# Patient Record
Sex: Female | Born: 1960
Health system: Southern US, Community
[De-identification: ages and names within clinical notes are randomized; demographics above are authoritative.]

## PROBLEM LIST (undated history)

## (undated) DIAGNOSIS — F419 Anxiety disorder, unspecified: Secondary | ICD-10-CM

## (undated) DIAGNOSIS — F988 Other specified behavioral and emotional disorders with onset usually occurring in childhood and adolescence: Secondary | ICD-10-CM

## (undated) DIAGNOSIS — E079 Disorder of thyroid, unspecified: Secondary | ICD-10-CM

## (undated) DIAGNOSIS — F32A Depression, unspecified: Secondary | ICD-10-CM

## (undated) DIAGNOSIS — F329 Major depressive disorder, single episode, unspecified: Secondary | ICD-10-CM

## (undated) HISTORY — DX: Depression, unspecified: F32.A

## (undated) HISTORY — DX: Anxiety disorder, unspecified: F41.9

## (undated) HISTORY — DX: Other specified behavioral and emotional disorders with onset usually occurring in childhood and adolescence: F98.8

## (undated) HISTORY — DX: Major depressive disorder, single episode, unspecified: F32.9

---

## 2002-09-08 ENCOUNTER — Ambulatory Visit (HOSPITAL_COMMUNITY): Admission: RE | Admit: 2002-09-08 | Discharge: 2002-09-08 | Payer: Self-pay | Admitting: Endocrinology

## 2002-09-09 ENCOUNTER — Encounter: Payer: Self-pay | Admitting: Endocrinology

## 2002-09-22 ENCOUNTER — Encounter: Payer: Self-pay | Admitting: Endocrinology

## 2002-09-22 ENCOUNTER — Ambulatory Visit (HOSPITAL_COMMUNITY): Admission: RE | Admit: 2002-09-22 | Discharge: 2002-09-22 | Payer: Self-pay | Admitting: Endocrinology

## 2003-01-12 ENCOUNTER — Encounter: Admission: RE | Admit: 2003-01-12 | Discharge: 2003-01-18 | Payer: Self-pay | Admitting: Orthopedic Surgery

## 2015-10-27 ENCOUNTER — Other Ambulatory Visit: Payer: Self-pay | Admitting: *Deleted

## 2015-10-27 NOTE — Telephone Encounter (Signed)
Last filled 09/29/15. Route to pool for call in

## 2015-10-28 MED ORDER — ALPRAZOLAM 1 MG PO TABS: ORAL_TABLET | ORAL | 0 refills | Status: DC

## 2015-11-02 ENCOUNTER — Ambulatory Visit (INDEPENDENT_AMBULATORY_CARE_PROVIDER_SITE_OTHER): Payer: 59 | Admitting: Physician Assistant

## 2015-11-02 ENCOUNTER — Encounter: Payer: Self-pay | Admitting: Physician Assistant

## 2015-11-02 VITALS — BP 115/65 | HR 71 | Temp 97.0°F | Ht 65.0 in | Wt 139.0 lb

## 2015-11-02 DIAGNOSIS — K219 Gastro-esophageal reflux disease without esophagitis: Secondary | ICD-10-CM

## 2015-11-02 DIAGNOSIS — M25561 Pain in right knee: Secondary | ICD-10-CM

## 2015-11-02 DIAGNOSIS — E039 Hypothyroidism, unspecified: Secondary | ICD-10-CM

## 2015-11-02 DIAGNOSIS — F4322 Adjustment disorder with anxiety: Secondary | ICD-10-CM

## 2015-11-02 DIAGNOSIS — F900 Attention-deficit hyperactivity disorder, predominantly inattentive type: Secondary | ICD-10-CM

## 2015-11-02 DIAGNOSIS — M25562 Pain in left knee: Secondary | ICD-10-CM | POA: Diagnosis not present

## 2015-11-02 MED ORDER — AMPHETAMINE-DEXTROAMPHET ER 20 MG PO CP24: 20.0000 mg | ORAL_CAPSULE | Freq: Every day | ORAL | 0 refills | Status: DC

## 2015-11-02 MED ORDER — RISPERIDONE 0.5 MG PO TABS: 0.5000 mg | ORAL_TABLET | Freq: Every day | ORAL | 11 refills | Status: DC

## 2015-11-02 MED ORDER — MELOXICAM 15 MG PO TABS: 15.0000 mg | ORAL_TABLET | Freq: Every day | ORAL | 11 refills | Status: DC

## 2015-11-02 MED ORDER — ALPRAZOLAM 1 MG PO TABS: ORAL_TABLET | ORAL | 5 refills | Status: DC

## 2015-11-02 NOTE — Patient Instructions (Signed)
Adjustment Disorder Adjustment disorder is an unusually severe reaction to a stressful life event, such as the loss of a job or physical illness. The event may be any stressful event other than the loss of a loved one. Adjustment disorder may affect your feelings, your thinking, how you act, or a combination of these. It may interfere with personal relationships or with the way you are at work, school, or home. People with this disorder are at risk for suicide and substance abuse. They may develop a more serious mental disorder, such as major depressive disorder or post-traumatic stress disorder. SIGNS AND SYMPTOMS  Symptoms may include:  Sadness, depressed mood, or crying spells.  Loss of enjoyment.  Change in appetite or weight.  Sense of loss or hopelessness.  Thoughts of suicide.  Anxiety, worry, or nervousness.  Trouble sleeping.  Avoiding family and friends.  Poor school performance.  Fighting or vandalism.  Reckless driving.  Skipping school.  Poor work International aid/development workerperformance.  Ignoring bills. Symptoms of adjustment disorder start within 3 months of the stressful life event. They do not last more than 6 months after the event has ended. DIAGNOSIS  To make a diagnosis, your health care provider will ask about what has happened in your life and how it has affected you. He or she may also ask about your medical history and use of medicines, alcohol, and other substances. Your health care provider may do a physical exam and order lab tests or other studies. You may be referred to a mental health specialist for evaluation. TREATMENT  Treatment options include:  Counseling or talk therapy. Talk therapy is usually provided by mental health specialists.  Medicine. Certain medicines may help with depression, anxiety, and sleep.  Support groups. Support groups offer emotional support, advice, and guidance. They are made up of people who have had similar experiences. HOME CARE  INSTRUCTIONS  Keep all follow-up visits as directed by your health care provider. This is important.  Take medicines only as directed by your health care provider. SEEK MEDICAL CARE IF:  Your symptoms get worse.  SEEK IMMEDIATE MEDICAL CARE IF: You have serious thoughts about hurting yourself or someone else. MAKE SURE YOU:  Understand these instructions.  Will watch your condition.  Will get help right away if you are not doing well or get worse.   This information is not intended to replace advice given to you by your health care provider. Make sure you discuss any questions you have with your health care provider.   Document Released: 08/29/2005 Document Revised: 01/15/2014 Document Reviewed: 05/19/2013 Elsevier Interactive Patient Education [employer]2016 Elsevier Inc.

## 2015-11-02 NOTE — Progress Notes (Signed)
BP 115/65    Pulse 71    Temp 97 F (36.1 C) (Oral)    Ht 5\' 5"  (1.651 m)    Wt 139 lb (63 kg)    BMI 23.13 kg/m    Subjective:    Patient ID: Phyllis Parker, female    DOB: 1960/05/26, 55 y.o.   MRN: [id number]  Phyllis Parker is a 55 y.o. female presenting on 11/02/2015 for Discuss issues  HPI Patient here to be established as new patient at [facility].  This patient is known to me from [facility]. She had an episode last week that built up to a visit to the ED on Saturday at [facility]. She had pain in the lower left chest behind her breast. It was not worsened with movement, breathing or cough. Pain did not radiate to the neck or head or jaw.  She was ruled out cardiovascularly at the Ed.  Labs are scanned to chart.  At this time is extremely emotional and having stressors at work. There are several strong personalities and she is overwhelmed by them.  She had stopped her risperidone and wellbutrin about 4 weeks ago and this may have added to the culmination of this.    All meds are reviewed and refilled and adjusted as needed.  Past Medical History:  Diagnosis Date   ADD (attention deficit disorder)    Anxiety    Depression    Relevant past medical, surgical, family and social history reviewed and updated as indicated. Interim medical history since our last visit reviewed. Allergies and medications reviewed and updated.   Data reviewed from any sources in EPIC.  Review of Systems  Constitutional: Positive for fatigue and unexpected weight change. Negative for activity change and fever.  HENT: Negative.   Eyes: Negative.   Respiratory: Positive for chest tightness. Negative for cough, shortness of breath and wheezing.   Cardiovascular: Negative.  Negative for chest pain, palpitations and leg swelling.  Gastrointestinal: Negative.  Negative for abdominal pain.  Endocrine: Negative.   Genitourinary: Negative.  Negative for dysuria.   Musculoskeletal: Positive for arthralgias, back pain and joint swelling.  Skin: Negative.   Neurological: Negative.   Psychiatric/Behavioral: Positive for decreased concentration, dysphoric mood and sleep disturbance. Negative for suicidal ideas. The patient is nervous/anxious.      Social History   Social History   Marital status: Married    Spouse name: N/A   Number of children: N/A   Years of education: N/A   Occupational History   Not on file.   Social History Main Topics   Smoking status: Never Smoker   Smokeless tobacco: Never Used   Alcohol use Not on file   Drug use: Unknown   Sexual activity: Not on file   Other Topics Concern   Not on file   Social History Narrative   No narrative on file    History reviewed. No pertinent surgical history.  Family History  Problem Relation Age of Onset   Cancer Mother    Prostate cancer Father       Medication List       Accurate as of 11/02/15  5:16 PM. Always use your most recent med list.          ALPRAZolam 1 MG tablet Commonly known as:  XANAX Take one tab po 2 to 3 times qd prn   amphetamine-dextroamphetamine 20 MG 24 hr capsule Commonly known as:  ADDERALL XR Take 1 capsule (20  mg total) by mouth daily.   amphetamine-dextroamphetamine 20 MG 24 hr capsule Commonly known as:  ADDERALL XR Take 1 capsule (20 mg total) by mouth daily.   amphetamine-dextroamphetamine 20 MG 24 hr capsule Commonly known as:  ADDERALL XR Take 1 capsule (20 mg total) by mouth daily.   buPROPion 150 MG 24 hr tablet Commonly known as:  WELLBUTRIN XL   dicyclomine 10 MG capsule Commonly known as:  BENTYL Take 10 mg by mouth daily as needed for spasms.   hydrOXYzine 25 MG capsule Commonly known as:  VISTARIL   levothyroxine 88 MCG tablet Commonly known as:  SYNTHROID, LEVOTHROID   meloxicam 15 MG tablet Commonly known as:  MOBIC Take 1 tablet (15 mg total) by mouth daily.   pantoprazole 40 MG  tablet Commonly known as:  PROTONIX   risperiDONE 0.5 MG tablet Commonly known as:  RISPERDAL Take 1 tablet (0.5 mg total) by mouth daily.   sertraline 100 MG tablet Commonly known as:  ZOLOFT          Objective:    BP 115/65    Pulse 71    Temp 97 F (36.1 C) (Oral)    Ht 5\' 5"  (1.651 m)    Wt 139 lb (63 kg)    BMI 23.13 kg/m   Allergies  Allergen Reactions   Sulfa Antibiotics Nausea And Vomiting   Vioxx [Rofecoxib]    Wt Readings from Last 3 Encounters:  11/02/15 139 lb (63 kg)    Physical Exam  Constitutional: She is oriented to person, place, and time. She appears well-developed and well-nourished.  HENT:  Head: Normocephalic and atraumatic.  Eyes: Conjunctivae and EOM are normal. Pupils are equal, round, and reactive to light.  Neck: Normal range of motion. Neck supple.  Cardiovascular: Normal rate, regular rhythm, normal heart sounds and intact distal pulses.   Pulmonary/Chest: Effort normal and breath sounds normal.  Abdominal: Soft. Bowel sounds are normal.  Neurological: She is alert and oriented to person, place, and time. She has normal reflexes.  Skin: Skin is warm and dry. No rash noted.  Psychiatric: Judgment and thought content normal. Her mood appears anxious. She is withdrawn. She exhibits a depressed mood.        Assessment & Plan:   1. Adjustment disorder with anxious mood - risperiDONE (RISPERDAL) 0.5 MG tablet; Take 1 tablet (0.5 mg total) by mouth daily.  Dispense: 30 tablet; Refill: 11 Counselor and psychiatry numbers given Patient to find out her psychiatry benefits  2. Attention deficit hyperactivity disorder (ADHD), predominantly inattentive type - amphetamine-dextroamphetamine (ADDERALL XR) 20 MG 24 hr capsule; Take 1 capsule (20 mg total) by mouth daily.  Dispense: 30 capsule; Refill: 0 - amphetamine-dextroamphetamine (ADDERALL XR) 20 MG 24 hr capsule; Take 1 capsule (20 mg total) by mouth daily.  Dispense: 30 capsule; Refill: 0 -  amphetamine-dextroamphetamine (ADDERALL XR) 20 MG 24 hr capsule; Take 1 capsule (20 mg total) by mouth daily.  Dispense: 30 capsule; Refill: 0  3. Gastroesophageal reflux disease without esophagitis  4. Hypothyroidism, unspecified type  5. Arthralgia of both lower legs - meloxicam (MOBIC) 15 MG tablet; Take 1 tablet (15 mg total) by mouth daily.  Dispense: 30 tablet; Refill: 11   Continue all other maintenance medications as listed above. Educational handout given for adjustment disorder  Follow up plan: Return in about 4 weeks (around 11/30/2015).  Remus LofflerAngel S. Collene Massimino PA-C [facility] [address]  [place], [place] [id number] [phone]  11/02/2015, 5:16 PM

## 2015-11-29 ENCOUNTER — Ambulatory Visit (INDEPENDENT_AMBULATORY_CARE_PROVIDER_SITE_OTHER): Payer: 59 | Admitting: Physician Assistant

## 2015-11-29 ENCOUNTER — Encounter: Payer: Self-pay | Admitting: Physician Assistant

## 2015-11-29 VITALS — Temp 97.4°F | Ht 65.0 in | Wt 145.2 lb

## 2015-11-29 DIAGNOSIS — F331 Major depressive disorder, recurrent, moderate: Secondary | ICD-10-CM | POA: Diagnosis not present

## 2015-11-29 DIAGNOSIS — R42 Dizziness and giddiness: Secondary | ICD-10-CM

## 2015-11-29 DIAGNOSIS — F4322 Adjustment disorder with anxiety: Secondary | ICD-10-CM

## 2015-11-29 DIAGNOSIS — H8110 Benign paroxysmal vertigo, unspecified ear: Secondary | ICD-10-CM | POA: Insufficient documentation

## 2015-11-29 DIAGNOSIS — F32A Depression, unspecified: Secondary | ICD-10-CM | POA: Insufficient documentation

## 2015-11-29 DIAGNOSIS — F329 Major depressive disorder, single episode, unspecified: Secondary | ICD-10-CM | POA: Insufficient documentation

## 2015-11-29 MED ORDER — MECLIZINE HCL 25 MG PO TABS: 25.0000 mg | ORAL_TABLET | Freq: Three times a day (TID) | ORAL | 0 refills | Status: DC | PRN

## 2015-11-29 MED ORDER — DICYCLOMINE HCL 10 MG PO CAPS: 10.0000 mg | ORAL_CAPSULE | Freq: Three times a day (TID) | ORAL | 3 refills | Status: DC

## 2015-11-29 MED ORDER — RISPERIDONE 0.25 MG PO TABS: 0.5000 mg | ORAL_TABLET | Freq: Every day | ORAL | 6 refills | Status: DC

## 2015-11-29 NOTE — Patient Instructions (Signed)
Benign Positional Vertigo Introduction Vertigo is the feeling that you or your surroundings are moving when they are not. Benign positional vertigo is the most common form of vertigo. The cause of this condition is not serious (is benign). This condition is triggered by certain movements and positions (is positional). This condition can be dangerous if it occurs while you are doing something that could endanger you or others, such as driving. What are the causes? In many cases, the cause of this condition is not known. It may be caused by a disturbance in an area of the inner ear that helps your brain to sense movement and balance. This disturbance can be caused by a viral infection (labyrinthitis), head injury, or repetitive motion. What increases the risk? This condition is more likely to develop in:  Women.  People who are 53 years of age or older. What are the signs or symptoms? Symptoms of this condition usually happen when you move your head or your eyes in different directions. Symptoms may start suddenly, and they usually last for less than a minute. Symptoms may include:  Loss of balance and falling.  Feeling like you are spinning or moving.  Feeling like your surroundings are spinning or moving.  Nausea and vomiting.  Blurred vision.  Dizziness.  Involuntary eye movement (nystagmus). Symptoms can be mild and cause only slight annoyance, or they can be severe and interfere with daily life. Episodes of benign positional vertigo may return (recur) over time, and they may be triggered by certain movements. Symptoms may improve over time. How is this diagnosed? This condition is usually diagnosed by medical history and a physical exam of the head, neck, and ears. You may be referred to a health care provider who specializes in ear, nose, and throat (ENT) problems (otolaryngologist) or a provider who specializes in disorders of the nervous system (neurologist). You may have  additional testing, including:  MRI.  A CT scan.  Eye movement tests. Your health care provider may ask you to change positions quickly while he or she watches you for symptoms of benign positional vertigo, such as nystagmus. Eye movement may be tested with an electronystagmogram (ENG), caloric stimulation, the Dix-Hallpike test, or the roll test.  An electroencephalogram (EEG). This records electrical activity in your brain.  Hearing tests. How is this treated? Usually, your health care provider will treat this by moving your head in specific positions to adjust your inner ear back to normal. Surgery may be needed in severe cases, but this is rare. In some cases, benign positional vertigo may resolve on its own in 2-4 weeks. Follow these instructions at home: Safety  Move slowly.Avoid sudden body or head movements.  Avoid driving.  Avoid operating heavy machinery.  Avoid doing any tasks that would be dangerous to you or others if a vertigo episode would occur.  If you have trouble walking or keeping your balance, try using a cane for stability. If you feel dizzy or unstable, sit down right away.  Return to your normal activities as told by your health care provider. Ask your health care provider what activities are safe for you. General instructions  Take over-the-counter and prescription medicines only as told by your health care provider.  Avoid certain positions or movements as told by your health care provider.  Drink enough fluid to keep your urine clear or pale yellow.  Keep all follow-up visits as told by your health care provider. This is important. Contact a health care provider if:  You have a fever.  Your condition gets worse or you develop new symptoms.  Your family or friends notice any behavioral changes.  Your nausea or vomiting gets worse.  You have numbness or a pins and needles sensation. Get help right away if:  You have difficulty speaking or  moving.  You are always dizzy.  You faint.  You develop severe headaches.  You have weakness in your legs or arms.  You have changes in your hearing or vision.  You develop a stiff neck.  You develop sensitivity to light. This information is not intended to replace advice given to you by your health care provider. Make sure you discuss any questions you have with your health care provider. Document Released: 10/02/2005 Document Revised: 06/02/2015 Document Reviewed: 04/19/2014  2017 Elsevier

## 2015-11-29 NOTE — Progress Notes (Signed)
Temp 97.4 F (36.3 C) (Oral)   Ht 5\' 5"  (1.651 m)   Wt 145 lb 3.2 oz (65.9 kg)   BMI 24.16 kg/m    Subjective:    Patient ID: Phyllis Parker, female    DOB: 1960/11/07, 55 y.o.   MRN: [id number]  HPI: Phyllis Parker is a 55 y.o. female presenting on 11/29/2015 for Follow-up (1 month follow up on Depression ) and Dizziness  She states that the dizziness began about 5 days ago. She is supple couple of medications thinking that it was related to that. The symptoms did not resolve. She has had a history of motion sickness and had used Antivert in the past. She took a little bit of this and did notice some improvement. She noticed that the dizziness occurred more with head movement. She did not have any nausea vomiting associated with it. She denies any fever or chills. She denies any recent upper respiratory infections.  She also reports having dissatisfaction with having gained 6 pounds this month on the risperidone. And would like to look at different medications. He did cause significant carb craving.   Relevant past medical, surgical, family and social history reviewed and updated as indicated. Allergies and medications reviewed and updated.  Past Medical History:  Diagnosis Date  . ADD (attention deficit disorder)   . Anxiety   . Depression     History reviewed. No pertinent surgical history.  Review of Systems  Constitutional: Negative.  Negative for activity change, fatigue and fever.  HENT: Negative.   Eyes: Negative.   Respiratory: Negative.  Negative for cough.   Cardiovascular: Negative.  Negative for chest pain.  Gastrointestinal: Negative.  Negative for abdominal pain.  Endocrine: Negative.   Genitourinary: Negative.  Negative for dysuria.  Musculoskeletal: Negative.   Skin: Negative.   Neurological: Positive for dizziness. Negative for seizures, facial asymmetry, weakness, light-headedness, numbness and headaches.      Medication List       Accurate as of 11/29/15  11:51 AM. Always use your most recent med list.          ALPRAZolam 1 MG tablet Commonly known as:  XANAX Take one tab po 2 to 3 times qd prn   amphetamine-dextroamphetamine 20 MG 24 hr capsule Commonly known as:  ADDERALL XR Take 1 capsule (20 mg total) by mouth daily.   amphetamine-dextroamphetamine 20 MG 24 hr capsule Commonly known as:  ADDERALL XR Take 1 capsule (20 mg total) by mouth daily.   amphetamine-dextroamphetamine 20 MG 24 hr capsule Commonly known as:  ADDERALL XR Take 1 capsule (20 mg total) by mouth daily.   buPROPion 150 MG 24 hr tablet Commonly known as:  WELLBUTRIN XL   dicyclomine 10 MG capsule Commonly known as:  BENTYL Take 1 capsule (10 mg total) by mouth 4 (four) times daily -  before meals and at bedtime.   levothyroxine 88 MCG tablet Commonly known as:  SYNTHROID, LEVOTHROID   meclizine 25 MG tablet Commonly known as:  ANTIVERT Take 1 tablet (25 mg total) by mouth 3 (three) times daily as needed for dizziness.   meloxicam 15 MG tablet Commonly known as:  MOBIC Take 1 tablet (15 mg total) by mouth daily.   pantoprazole 40 MG tablet Commonly known as:  PROTONIX   risperiDONE 0.25 MG tablet Commonly known as:  RISPERDAL Take 2 tablets (0.5 mg total) by mouth daily.   sertraline 100 MG tablet Commonly known as:  ZOLOFT  Objective:    Temp 97.4 F (36.3 C) (Oral)   Ht 5\' 5"  (1.651 m)   Wt 145 lb 3.2 oz (65.9 kg)   BMI 24.16 kg/m   Allergies  Allergen Reactions  . Sulfa Antibiotics Nausea And Vomiting  . Vioxx [Rofecoxib]     Physical Exam  Constitutional: She is oriented to person, place, and time. She appears well-developed and well-nourished.  HENT:  Head: Normocephalic and atraumatic.  Eyes: Conjunctivae and EOM are normal. Pupils are equal, round, and reactive to light.  Cardiovascular: Normal rate, regular rhythm, normal heart sounds and intact distal pulses.   Pulmonary/Chest: Effort normal and breath sounds  normal.  Abdominal: Soft. Bowel sounds are normal.  Neurological: She is alert and oriented to person, place, and time. She has normal reflexes.  Skin: Skin is warm and dry. No rash noted.  Psychiatric: She has a normal mood and affect. Her behavior is normal. Judgment and thought content normal.  Nursing note and vitals reviewed.      Assessment & Plan:   1. Benign paroxysmal positional vertigo, unspecified laterality  2. Moderate episode of recurrent major depressive disorder (HCC)  3. Dizziness - meclizine (ANTIVERT) 25 MG tablet; Take 1 tablet (25 mg total) by mouth 3 (three) times daily as needed for dizziness.  Dispense: 30 tablet; Refill: 0  4. Adjustment disorder with anxious mood - risperiDONE (RISPERDAL) 0.25 MG tablet; Take 2 tablets (0.5 mg total) by mouth daily.  Dispense: 30 tablet; Refill: 6 Lower dose and in one week change if appetite is not better.  Continue all other maintenance medications as listed above.  Follow up plan: Return in about 4 weeks (around 12/27/2015) for recheck meds.  Educational handout given for vertigo  Remus Loffler PA-C [facility] [address]  [place], [place] [address] [phone]   11/29/2015, 11:51 AM

## 2016-02-01 ENCOUNTER — Other Ambulatory Visit: Payer: Self-pay | Admitting: Physician Assistant

## 2016-02-01 DIAGNOSIS — R42 Dizziness and giddiness: Secondary | ICD-10-CM

## 2016-02-07 ENCOUNTER — Other Ambulatory Visit: Payer: Self-pay | Admitting: Physician Assistant

## 2016-02-10 ENCOUNTER — Other Ambulatory Visit: Payer: Self-pay | Admitting: *Deleted

## 2016-02-28 ENCOUNTER — Other Ambulatory Visit: Payer: Self-pay | Admitting: Physician Assistant

## 2016-03-29 ENCOUNTER — Other Ambulatory Visit: Payer: Self-pay | Admitting: Physician Assistant

## 2016-03-29 DIAGNOSIS — R42 Dizziness and giddiness: Secondary | ICD-10-CM

## 2016-04-24 ENCOUNTER — Ambulatory Visit (INDEPENDENT_AMBULATORY_CARE_PROVIDER_SITE_OTHER): Payer: 59 | Admitting: Physician Assistant

## 2016-04-24 ENCOUNTER — Encounter: Payer: Self-pay | Admitting: Physician Assistant

## 2016-04-24 VITALS — BP 108/73 | HR 72 | Temp 97.0°F | Ht 65.0 in | Wt 150.2 lb

## 2016-04-24 DIAGNOSIS — F900 Attention-deficit hyperactivity disorder, predominantly inattentive type: Secondary | ICD-10-CM | POA: Diagnosis not present

## 2016-04-24 DIAGNOSIS — E039 Hypothyroidism, unspecified: Secondary | ICD-10-CM | POA: Diagnosis not present

## 2016-04-24 DIAGNOSIS — F331 Major depressive disorder, recurrent, moderate: Secondary | ICD-10-CM | POA: Diagnosis not present

## 2016-04-24 DIAGNOSIS — Z0000 Encounter for general adult medical examination without abnormal findings: Secondary | ICD-10-CM | POA: Diagnosis not present

## 2016-04-24 DIAGNOSIS — B001 Herpesviral vesicular dermatitis: Secondary | ICD-10-CM

## 2016-04-24 MED ORDER — TOPIRAMATE 50 MG PO TABS: 50.0000 mg | ORAL_TABLET | Freq: Two times a day (BID) | ORAL | 5 refills | Status: DC

## 2016-04-24 MED ORDER — VALACYCLOVIR HCL 500 MG PO TABS: 500.0000 mg | ORAL_TABLET | Freq: Two times a day (BID) | ORAL | 11 refills | Status: DC

## 2016-04-24 MED ORDER — AMPHETAMINE-DEXTROAMPHET ER 20 MG PO CP24: 20.0000 mg | ORAL_CAPSULE | Freq: Every day | ORAL | 0 refills | Status: DC

## 2016-04-24 NOTE — Patient Instructions (Signed)
Stress and Stress Management Stress is a normal reaction to life events. It is what you feel when life demands more than you are used to or more than you can handle. Some stress can be useful. For example, the stress reaction can help you catch the last bus of the day, study for a test, or meet a deadline at work. But stress that occurs too often or for too long can cause problems. It can affect your emotional health and interfere with relationships and normal daily activities. Too much stress can weaken your immune system and increase your risk for physical illness. If you already have a medical problem, stress can make it worse. What are the causes? All sorts of life events may cause stress. An event that causes stress for one person may not be stressful for another person. Major life events commonly cause stress. These may be positive or negative. Examples include losing your job, moving into a new home, getting married, having a baby, or losing a loved one. Less obvious life events may also cause stress, especially if they occur day after day or in combination. Examples include working long hours, driving in traffic, caring for children, being in debt, or being in a difficult relationship. What are the signs or symptoms? Stress may cause emotional symptoms including, the following:  Anxiety. This is feeling worried, afraid, on edge, overwhelmed, or out of control.  Anger. This is feeling irritated or impatient.  Depression. This is feeling sad, down, helpless, or guilty.  Difficulty focusing, remembering, or making decisions.  Stress may cause physical symptoms, including the following:  Aches and pains. These may affect your head, neck, back, stomach, or other areas of your body.  Tight muscles or clenched jaw.  Low energy or trouble sleeping.  Stress may cause unhealthy behaviors, including the following:  Eating to feel better (overeating) or skipping meals.  Sleeping too little,  too much, or both.  Working too much or putting off tasks (procrastination).  Smoking, drinking alcohol, or using drugs to feel better.  How is this diagnosed? Stress is diagnosed through an assessment by your health care provider. Your health care provider will ask questions about your symptoms and any stressful life events.Your health care provider will also ask about your medical history and may order blood tests or other tests. Certain medical conditions and medicine can cause physical symptoms similar to stress. Mental illness can cause emotional symptoms and unhealthy behaviors similar to stress. Your health care provider may refer you to a mental health professional for further evaluation. How is this treated? Stress management is the recommended treatment for stress.The goals of stress management are reducing stressful life events and coping with stress in healthy ways. Techniques for reducing stressful life events include the following:  Stress identification. Self-monitor for stress and identify what causes stress for you. These skills may help you to avoid some stressful events.  Time management. Set your priorities, keep a calendar of events, and learn to say "no." These tools can help you avoid making too many commitments.  Techniques for coping with stress include the following:  Rethinking the problem. Try to think realistically about stressful events rather than ignoring them or overreacting. Try to find the positives in a stressful situation rather than focusing on the negatives.  Exercise. Physical exercise can release both physical and emotional tension. The key is to find a form of exercise you enjoy and do it regularly.  Relaxation techniques. These relax the body and   meditation, tai chi, biofeedback, deep breathing, progressive muscle relaxation, listening to music, being out in nature, journaling, and other hobbies. Again, the key is to find one or  more that you enjoy and can do regularly.  Healthy lifestyle. Eat a balanced diet, get plenty of sleep, and do not smoke. Avoid using alcohol or drugs to relax.  Strong support network. Spend time with family, friends, or other people you enjoy being around.Express your feelings and talk things over with someone you trust. Counseling or talktherapy with a mental health professional may be helpful if you are having difficulty managing stress on your own. Medicine is typically not recommended for the treatment of stress.Talk to your health care provider if you think you need medicine for symptoms of stress. Follow these instructions at home:  Keep all follow-up visits as directed by your health care provider.  Take all medicines as directed by your health care provider. Contact a health care provider if:  Your symptoms get worse or you start having new symptoms.  You feel overwhelmed by your problems and can no longer manage them on your own. Get help right away if:  You feel like hurting yourself or someone else. This information is not intended to replace advice given to you by your health care provider. Make sure you discuss any questions you have with your health care provider. Document Released: 06/20/2000 Document Revised: 06/02/2015 Document Reviewed: 08/19/2012 Elsevier Interactive Patient Education  2017 [employer].

## 2016-04-24 NOTE — Progress Notes (Signed)
BP 108/73   Pulse 72   Temp 97 F (36.1 C) (Oral)   Ht 5\' 5"  (1.651 m)   Wt 150 lb 3.2 oz (68.1 kg)   BMI 24.99 kg/m    Subjective:    Patient ID: Phyllis Parker, female    DOB: June 12, 1960, 56 y.o.   MRN: [id number]  HPI: Phyllis Parker is a 56 y.o. female presenting on 04/24/2016 for Follow-up; Thyroid check; and Medication Refill  This patient comes in for periodic recheck on medications and conditions including hypothyroidism, depression, ADHD, mourning. She is havinga very hard time with the death of her stepchildren's mother (husband's ex wife).  The family was close despite the divorce, she is grieving for the children. The woman committed suicide. Severe fatigue, no energy or motivation.   Depression screen [facility] 2/9 04/24/2016 11/29/2015 11/02/2015  Decreased Interest 1 1 1   Down, Depressed, Hopeless 1 - 1  PHQ - 2 Score 2 1 2   Altered sleeping 1 1 1   Tired, decreased energy 1 1 1   Change in appetite 1 1 1   Feeling bad or failure about yourself  1 1 1   Trouble concentrating 1 1 1   Moving slowly or fidgety/restless 1 1 1   Suicidal thoughts 0 0 0  PHQ-9 Score 8 7 8      All medications are reviewed today. There are no reports of any problems with the medications. All of the medical conditions are reviewed and updated.  Lab work is reviewed and will be ordered as medically necessary. There are no new problems reported with today's visit.   Relevant past medical, surgical, family and social history reviewed and updated as indicated. Allergies and medications reviewed and updated.  Past Medical History:  Diagnosis Date  . ADD (attention deficit disorder)   . Anxiety   . Depression     History reviewed. No pertinent surgical history.  Review of Systems  Constitutional: Negative.  Negative for activity change, fatigue and fever.  HENT: Negative.   Eyes: Negative.   Respiratory: Negative.  Negative for cough.   Cardiovascular: Negative.  Negative for chest pain.   Gastrointestinal: Negative.  Negative for abdominal pain.  Endocrine: Negative.   Genitourinary: Negative.  Negative for dysuria.  Musculoskeletal: Negative.   Skin: Negative.   Neurological: Negative.   Psychiatric/Behavioral: Positive for decreased concentration, dysphoric mood and sleep disturbance. Negative for self-injury and suicidal ideas. The patient is nervous/anxious.     Allergies as of 04/24/2016      Reactions   Sulfa Antibiotics Nausea And Vomiting   Vioxx [rofecoxib]       Medication List       Accurate as of 04/24/16  2:54 PM. Always use your most recent med list.          ALPRAZolam 1 MG tablet Commonly known as:  XANAX Take one tab po 2 to 3 times qd prn   amphetamine-dextroamphetamine 20 MG 24 hr capsule Commonly known as:  ADDERALL XR Take 1 capsule (20 mg total) by mouth daily.   amphetamine-dextroamphetamine 20 MG 24 hr capsule Commonly known as:  ADDERALL XR Take 1 capsule (20 mg total) by mouth daily.   amphetamine-dextroamphetamine 20 MG 24 hr capsule Commonly known as:  ADDERALL XR Take 1 capsule (20 mg total) by mouth daily.   dicyclomine 10 MG capsule Commonly known as:  BENTYL Take 1 capsule (10 mg total) by mouth 4 (four) times daily -  before meals and at bedtime.   levothyroxine 88 MCG  tablet Commonly known as:  SYNTHROID, LEVOTHROID TAKE 1 TABLET DAILY   meclizine 25 MG tablet Commonly known as:  ANTIVERT Take 1 tablet by mouth 3 (three) times daily as needed for dizziness.   meloxicam 15 MG tablet Commonly known as:  MOBIC Take 1 tablet (15 mg total) by mouth daily.   pantoprazole 40 MG tablet Commonly known as:  PROTONIX   sertraline 100 MG tablet Commonly known as:  ZOLOFT TAKE 1 TABLET DAILY   topiramate 50 MG tablet Commonly known as:  TOPAMAX Take 1 tablet (50 mg total) by mouth 2 (two) times daily.   valACYclovir 500 MG tablet Commonly known as:  VALTREX Take 1 tablet (500 mg total) by mouth 2 (two) times  daily.          Objective:    BP 108/73   Pulse 72   Temp 97 F (36.1 C) (Oral)   Ht 5\' 5"  (1.651 m)   Wt 150 lb 3.2 oz (68.1 kg)   BMI 24.99 kg/m   Allergies  Allergen Reactions  . Sulfa Antibiotics Nausea And Vomiting  . Vioxx [Rofecoxib]     Physical Exam  Constitutional: She is oriented to person, place, and time. She appears well-developed and well-nourished.  HENT:  Head: Normocephalic and atraumatic.  Eyes: Conjunctivae and EOM are normal. Pupils are equal, round, and reactive to light.  Cardiovascular: Normal rate, regular rhythm, normal heart sounds and intact distal pulses.   Pulmonary/Chest: Effort normal and breath sounds normal.  Abdominal: Soft. Bowel sounds are normal.  Neurological: She is alert and oriented to person, place, and time. She has normal reflexes.  Skin: Skin is warm and dry. No rash noted.  Psychiatric: She has a normal mood and affect. Her behavior is normal. Judgment and thought content normal.    No results found for this or any previous visit.    Assessment & Plan:   1. Hypothyroidism, unspecified type - CMP14+EGFR - TSH  2. Moderate episode of recurrent major depressive disorder (HCC) - topiramate (TOPAMAX) 50 MG tablet; Take 1 tablet (50 mg total) by mouth 2 (two) times daily.  Dispense: 60 tablet; Refill: 5  3. Attention deficit hyperactivity disorder (ADHD), predominantly inattentive type - amphetamine-dextroamphetamine (ADDERALL XR) 20 MG 24 hr capsule; Take 1 capsule (20 mg total) by mouth daily.  Dispense: 30 capsule; Refill: 0  4. Well adult exam Not performed, here for labs ordering - CMP14+EGFR - Lipid panel - TSH - CBC with Differential/Platelet  5. Fever blister - valACYclovir (VALTREX) 500 MG tablet; Take 1 tablet (500 mg total) by mouth 2 (two) times daily.  Dispense: 30 tablet; Refill: 11   Current Outpatient Prescriptions:  .  ALPRAZolam (XANAX) 1 MG tablet, Take one tab po 2 to 3 times qd prn, Disp: 90  tablet, Rfl: 5 .  dicyclomine (BENTYL) 10 MG capsule, Take 1 capsule (10 mg total) by mouth 4 (four) times daily -  before meals and at bedtime., Disp: 90 capsule, Rfl: 3 .  levothyroxine (SYNTHROID, LEVOTHROID) 88 MCG tablet, TAKE 1 TABLET DAILY, Disp: 30 tablet, Rfl: 6 .  meclizine (ANTIVERT) 25 MG tablet, Take 1 tablet by mouth 3 (three) times daily as needed for dizziness., Disp: 30 tablet, Rfl: 0 .  meloxicam (MOBIC) 15 MG tablet, Take 1 tablet (15 mg total) by mouth daily., Disp: 30 tablet, Rfl: 11 .  sertraline (ZOLOFT) 100 MG tablet, TAKE 1 TABLET DAILY, Disp: 30 tablet, Rfl: 0 .  amphetamine-dextroamphetamine (ADDERALL XR) 20  MG 24 hr capsule, Take 1 capsule (20 mg total) by mouth daily. (Patient not taking: Reported on 04/24/2016), Disp: 30 capsule, Rfl: 0 .  amphetamine-dextroamphetamine (ADDERALL XR) 20 MG 24 hr capsule, Take 1 capsule (20 mg total) by mouth daily. (Patient not taking: Reported on 04/24/2016), Disp: 30 capsule, Rfl: 0 .  amphetamine-dextroamphetamine (ADDERALL XR) 20 MG 24 hr capsule, Take 1 capsule (20 mg total) by mouth daily., Disp: 30 capsule, Rfl: 0 .  pantoprazole (PROTONIX) 40 MG tablet, , Disp: , Rfl:  .  topiramate (TOPAMAX) 50 MG tablet, Take 1 tablet (50 mg total) by mouth 2 (two) times daily., Disp: 60 tablet, Rfl: 5 .  valACYclovir (VALTREX) 500 MG tablet, Take 1 tablet (500 mg total) by mouth 2 (two) times daily., Disp: 30 tablet, Rfl: 11  Continue all other maintenance medications as listed above.  Follow up plan: Return in about 4 weeks (around 05/22/2016).  Educational handout given for stress management  Remus Loffler PA-C [facility] [address]  [place], [place] [address] [phone]   04/24/2016, 2:54 PM

## 2016-04-25 LAB — LIPID PANEL
CHOL/HDL RATIO: 2.9 ratio (ref 0.0–4.4)
Cholesterol, Total: 233 mg/dL — ABNORMAL HIGH (ref 100–199)
HDL: 81 mg/dL (ref >39)
LDL CALC: 124 mg/dL — AB (ref 0–99)
TRIGLYCERIDES: 138 mg/dL (ref 0–149)
VLDL CHOLESTEROL CAL: 28 mg/dL (ref 5–40)

## 2016-04-25 LAB — CBC WITH DIFFERENTIAL/PLATELET
BASOS: 0 %
Basophils Absolute: 0 10*3/uL (ref 0.0–0.2)
EOS (ABSOLUTE): 0.2 10*3/uL (ref 0.0–0.4)
Eos: 3 %
HEMATOCRIT: 36.6 % (ref 34.0–46.6)
Hemoglobin: 12.3 g/dL (ref 11.1–15.9)
IMMATURE GRANULOCYTES: 0 %
Immature Grans (Abs): 0 10*3/uL (ref 0.0–0.1)
Lymphocytes Absolute: 1.4 10*3/uL (ref 0.7–3.1)
Lymphs: 28 %
MCH: 30.4 pg (ref 26.6–33.0)
MCHC: 33.6 g/dL (ref 31.5–35.7)
MCV: 91 fL (ref 79–97)
Monocytes Absolute: 0.2 10*3/uL (ref 0.1–0.9)
Monocytes: 3 %
NEUTROS ABS: 3.3 10*3/uL (ref 1.4–7.0)
NEUTROS PCT: 66 %
PLATELETS: 234 10*3/uL (ref 150–379)
RBC: 4.04 x10E6/uL (ref 3.77–5.28)
RDW: 12.5 % (ref 12.3–15.4)
WBC: 5.1 10*3/uL (ref 3.4–10.8)

## 2016-04-25 LAB — CMP14+EGFR
A/G RATIO: 2.4 — AB (ref 1.2–2.2)
ALBUMIN: 4.8 g/dL (ref 3.5–5.5)
ALT: 13 IU/L (ref 0–32)
AST: 20 IU/L (ref 0–40)
Alkaline Phosphatase: 66 IU/L (ref 39–117)
BUN/Creatinine Ratio: 14 (ref 9–23)
BUN: 11 mg/dL (ref 6–24)
Bilirubin Total: 0.3 mg/dL (ref 0.0–1.2)
CALCIUM: 9.6 mg/dL (ref 8.7–10.2)
CO2: 24 mmol/L (ref 18–29)
CREATININE: 0.79 mg/dL (ref 0.57–1.00)
Chloride: 101 mmol/L (ref 96–106)
GFR, EST AFRICAN AMERICAN: 99 mL/min/{1.73_m2} (ref >59)
GFR, EST NON AFRICAN AMERICAN: 86 mL/min/{1.73_m2} (ref >59)
GLOBULIN, TOTAL: 2 g/dL (ref 1.5–4.5)
Glucose: 81 mg/dL (ref 65–99)
Potassium: 4.1 mmol/L (ref 3.5–5.2)
SODIUM: 142 mmol/L (ref 134–144)
TOTAL PROTEIN: 6.8 g/dL (ref 6.0–8.5)

## 2016-04-25 LAB — TSH: TSH: 4 u[IU]/mL (ref 0.450–4.500)

## 2016-04-26 ENCOUNTER — Other Ambulatory Visit: Payer: Self-pay | Admitting: Physician Assistant

## 2016-04-27 NOTE — Telephone Encounter (Signed)
Last filled 03/30/16, last seen 04/24/16. Route to pool for call in

## 2016-04-30 NOTE — Telephone Encounter (Signed)
rx called into pharmacy

## 2016-05-10 ENCOUNTER — Other Ambulatory Visit: Payer: Self-pay | Admitting: Physician Assistant

## 2016-06-11 ENCOUNTER — Other Ambulatory Visit: Payer: Self-pay | Admitting: Physician Assistant

## 2016-07-23 ENCOUNTER — Other Ambulatory Visit: Payer: Self-pay | Admitting: Physician Assistant

## 2016-08-07 ENCOUNTER — Ambulatory Visit: Payer: 59 | Admitting: Physician Assistant

## 2016-09-11 ENCOUNTER — Other Ambulatory Visit: Payer: Self-pay | Admitting: Physician Assistant

## 2016-09-11 DIAGNOSIS — R42 Dizziness and giddiness: Secondary | ICD-10-CM

## 2016-09-25 ENCOUNTER — Other Ambulatory Visit: Payer: Self-pay | Admitting: Physician Assistant

## 2016-09-25 NOTE — Telephone Encounter (Signed)
Last seen 04/26/16  [facility]

## 2016-10-02 ENCOUNTER — Other Ambulatory Visit: Payer: Self-pay | Admitting: Physician Assistant

## 2016-10-18 ENCOUNTER — Other Ambulatory Visit: Payer: Self-pay | Admitting: Physician Assistant

## 2016-10-18 DIAGNOSIS — R42 Dizziness and giddiness: Secondary | ICD-10-CM

## 2016-10-23 ENCOUNTER — Other Ambulatory Visit: Payer: Self-pay | Admitting: Physician Assistant

## 2016-10-24 NOTE — Telephone Encounter (Signed)
Rx phoned in.

## 2016-11-06 ENCOUNTER — Encounter: Payer: Self-pay | Admitting: Family

## 2016-11-06 ENCOUNTER — Ambulatory Visit (INDEPENDENT_AMBULATORY_CARE_PROVIDER_SITE_OTHER): Payer: 59

## 2016-11-06 ENCOUNTER — Ambulatory Visit (INDEPENDENT_AMBULATORY_CARE_PROVIDER_SITE_OTHER): Payer: 59 | Admitting: Family

## 2016-11-06 VITALS — BP 128/85 | HR 76 | Temp 96.7°F

## 2016-11-06 DIAGNOSIS — S8265XA Nondisplaced fracture of lateral malleolus of left fibula, initial encounter for closed fracture: Secondary | ICD-10-CM

## 2016-11-06 DIAGNOSIS — M25572 Pain in left ankle and joints of left foot: Secondary | ICD-10-CM | POA: Diagnosis not present

## 2016-11-06 NOTE — Progress Notes (Signed)
° °  Subjective:    Patient ID: Phyllis Parker, female    DOB: 12-Dec-1960, 56 y.o.   MRN: [id number]  Fall  The accident occurred 3 to 6 hours ago. The fall occurred while walking (stairs). She landed on grass. There was no blood loss. Point of impact: left ankle. Pain location: left ankle. The pain is at a severity of 6/10. The pain is moderate. The symptoms are aggravated by pressure on injury, movement and standing. Pertinent negatives include no bowel incontinence, fever, hearing loss, hematuria, loss of consciousness, nausea, numbness, tingling, visual change or vomiting. She has tried acetaminophen and rest for the symptoms. The treatment provided mild relief.      Review of Systems  Constitutional: Negative for fever.  Gastrointestinal: Negative for bowel incontinence, nausea and vomiting.  Genitourinary: Negative for hematuria.  Musculoskeletal: Positive for arthralgias.  Neurological: Negative for tingling, loss of consciousness and numbness.  All other systems reviewed and are negative.      Objective:   Physical Exam  Constitutional: She is oriented to person, place, and time. She appears well-developed and well-nourished. No distress.  HENT:  Head: Normocephalic.  Cardiovascular: Normal rate, regular rhythm, normal heart sounds and intact distal pulses.   No murmur heard. Pulmonary/Chest: Effort normal and breath sounds normal. No respiratory distress. She has no wheezes.  Musculoskeletal: She exhibits edema and tenderness.  Left lateral and medial ankle tenderness, moderate swelling on lateral left ankle, decrease ROM with flexion or extension  Neurological: She is alert and oriented to person, place, and time.  Skin: Skin is warm and dry.  Psychiatric: She has a normal mood and affect. Her behavior is normal. Judgment and thought content normal.  Vitals reviewed.  Ankle x-ray- Nondisplaced fracture Preliminary reading by Jannifer Rodneyhristy Lonell Stamos, FNP [facility]   BP 128/85    Pulse 76     Temp (!) 96.7 F (35.9 C) (Oral)       Assessment & Plan:  1. Acute left ankle pain  - DG Ankle Complete Left; Future - Ambulatory referral to Orthopedic Surgery  2. Closed nondisplaced fracture of lateral malleolus of left fibula, initial encounter - Ambulatory referral to Orthopedic Surgery  Stat referral to Ortho  Will get appt today No weight bearing until Ortho   Jannifer Rodneyhristy Julez Huseby, FNP

## 2016-11-06 NOTE — Patient Instructions (Signed)
Ankle Pain Many things can cause ankle pain, including an injury to the area and overuse of the ankle.The ankle joint holds your body weight and allows you to move around. Ankle pain can occur on either side or the back of one ankle or both ankles. Ankle pain may be sharp and burning or dull and aching. There may be tenderness, stiffness, redness, or warmth around the ankle. Follow these instructions at home: Activity  Rest your ankle as told by your health care provider. Avoid any activities that cause ankle pain.  Do exercises as told by your health care provider.  Ask your health care provider if you can drive. Using a brace, a bandage, or crutches  If you were given a brace: ? Wear it as told by your health care provider. ? Remove it when you take a bath or a shower. ? Try not to move your ankle very much, but wiggle your toes from time to time. This helps to prevent swelling.  If you were given an elastic bandage: ? Remove it when you take a bath or a shower. ? Try not to move your ankle very much, but wiggle your toes from time to time. This helps to prevent swelling. ? Adjust the bandage to make it more comfortable if it feels too tight. ? Loosen the bandage if you have numbness or tingling in your foot or if your foot turns cold and blue.  If you have crutches, use them as told by your health care provider. Continue to use them until you can walk without feeling pain in your ankle. Managing pain, stiffness, and swelling  Raise (elevate) your ankle above the level of your heart while you are sitting or lying down.  If directed, apply ice to the area: ? Put ice in a plastic bag. ? Place a towel between your skin and the bag. ? Leave the ice on for 20 minutes, 2-3 times per day. General instructions  Keep all follow-up visits as told by your health care provider. This is important.  Record this information that may be helpful for you and your health care provider: ? How  often you have ankle pain. ? Where the pain is located. ? What the pain feels like.  Take over-the-counter and prescription medicines only as told by your health care provider. Contact a health care provider if:  Your pain gets worse.  Your pain is not relieved with medicines.  You have a fever or chills.  You are having more trouble with walking.  You have new symptoms. Get help right away if:  Your foot, leg, toes, or ankle tingles or becomes numb.  Your foot, leg, toes, or ankle becomes swollen.  Your foot, leg, toes, or ankle turns pale or blue. This information is not intended to replace advice given to you by your health care provider. Make sure you discuss any questions you have with your health care provider. Document Released: 06/14/2009 Document Revised: 08/26/2015 Document Reviewed: 07/27/2014 Elsevier Interactive Patient Education  2017 [employer]

## 2016-11-14 ENCOUNTER — Ambulatory Visit: Payer: 59 | Admitting: Physician Assistant

## 2016-11-20 DIAGNOSIS — S8265XD Nondisplaced fracture of lateral malleolus of left fibula, subsequent encounter for closed fracture with routine healing: Secondary | ICD-10-CM | POA: Diagnosis not present

## 2016-11-21 ENCOUNTER — Other Ambulatory Visit: Payer: Self-pay | Admitting: Physician Assistant

## 2016-11-21 NOTE — Telephone Encounter (Signed)
rx called into pharmacy

## 2016-12-03 ENCOUNTER — Other Ambulatory Visit: Payer: Self-pay | Admitting: Physician Assistant

## 2016-12-11 DIAGNOSIS — S8265XD Nondisplaced fracture of lateral malleolus of left fibula, subsequent encounter for closed fracture with routine healing: Secondary | ICD-10-CM | POA: Diagnosis not present

## 2016-12-24 ENCOUNTER — Other Ambulatory Visit: Payer: Self-pay | Admitting: Physician Assistant

## 2016-12-24 DIAGNOSIS — R42 Dizziness and giddiness: Secondary | ICD-10-CM

## 2016-12-25 NOTE — Telephone Encounter (Signed)
rx phoned in.

## 2016-12-25 NOTE — Telephone Encounter (Signed)
Last seen 11/06/16  Phyllis Parker If approved route [facility] nurse [facility]

## 2017-01-02 ENCOUNTER — Other Ambulatory Visit: Payer: Self-pay | Admitting: Physician Assistant

## 2017-01-17 DIAGNOSIS — M25572 Pain in left ankle and joints of left foot: Secondary | ICD-10-CM | POA: Diagnosis not present

## 2017-01-17 DIAGNOSIS — S8265XD Nondisplaced fracture of lateral malleolus of left fibula, subsequent encounter for closed fracture with routine healing: Secondary | ICD-10-CM | POA: Diagnosis not present

## 2017-01-22 ENCOUNTER — Other Ambulatory Visit: Payer: Self-pay | Admitting: Physician Assistant

## 2017-01-25 DIAGNOSIS — M25572 Pain in left ankle and joints of left foot: Secondary | ICD-10-CM | POA: Diagnosis not present

## 2017-01-29 ENCOUNTER — Ambulatory Visit: Payer: 59 | Admitting: Physician Assistant

## 2017-01-29 ENCOUNTER — Encounter: Payer: Self-pay | Admitting: Physician Assistant

## 2017-01-29 VITALS — BP 114/81 | HR 76 | Temp 98.0°F | Ht 65.0 in | Wt 160.4 lb

## 2017-01-29 DIAGNOSIS — F900 Attention-deficit hyperactivity disorder, predominantly inattentive type: Secondary | ICD-10-CM

## 2017-01-29 DIAGNOSIS — M25561 Pain in right knee: Secondary | ICD-10-CM | POA: Diagnosis not present

## 2017-01-29 DIAGNOSIS — G43809 Other migraine, not intractable, without status migrainosus: Secondary | ICD-10-CM

## 2017-01-29 DIAGNOSIS — F331 Major depressive disorder, recurrent, moderate: Secondary | ICD-10-CM

## 2017-01-29 DIAGNOSIS — M25562 Pain in left knee: Secondary | ICD-10-CM

## 2017-01-29 MED ORDER — AMPHETAMINE-DEXTROAMPHET ER 20 MG PO CP24: 20.0000 mg | ORAL_CAPSULE | Freq: Every day | ORAL | 0 refills | Status: DC

## 2017-01-29 MED ORDER — PANTOPRAZOLE SODIUM 40 MG PO TBEC: 40.0000 mg | DELAYED_RELEASE_TABLET | Freq: Every day | ORAL | 11 refills | Status: DC

## 2017-01-29 MED ORDER — MELOXICAM 15 MG PO TABS: 15.0000 mg | ORAL_TABLET | Freq: Every day | ORAL | 11 refills | Status: DC

## 2017-01-29 MED ORDER — ALPRAZOLAM 1 MG PO TABS: ORAL_TABLET | ORAL | 5 refills | Status: DC

## 2017-01-29 MED ORDER — DULOXETINE HCL 30 MG PO CPEP: 30.0000 mg | ORAL_CAPSULE | Freq: Every day | ORAL | 3 refills | Status: DC

## 2017-01-29 MED ORDER — SUMATRIPTAN SUCCINATE 50 MG PO TABS: 50.0000 mg | ORAL_TABLET | ORAL | 5 refills | Status: DC | PRN

## 2017-01-29 NOTE — Patient Instructions (Signed)
In a few days you may receive a survey in the mail or online from [employer] regarding your visit with us today. Please take a moment to fill this out. Your feedback is very important to our whole office. It can help us better understand your needs as well as improve your experience and satisfaction. Thank you for taking your time to complete it. We care about you.  Prudy FeelerAngel Jahad Old, PA-C

## 2017-01-30 NOTE — Progress Notes (Signed)
BP 114/81    Pulse 76    Temp 98 F (36.7 C) (Oral)    Ht 5\' 5"  (1.651 m)    Wt 160 lb 6.4 oz (72.8 kg)    BMI 26.69 kg/m    Subjective:    Patient ID: Phyllis Parker, female    DOB: 07/10/1960, 57 y.o.   MRN: [id number]  HPI: Phyllis Parker is a 57 y.o. female presenting on 01/29/2017 for Depression; Medication Refill; and Follow-up (thyroid )  Patient comes in for recheck on her chronic medical conditions including depression, hypothyroidism, migraine.  She broke her leg a few months ago and has still been in a brace.  She had to wear a boot for almost 9 weeks.  No surgery was required.  She is back to work 6 hours/day 4 days/week.  There is been some stressors related to finances.  She states her migraines have been worse.  She had a little bit of problem with these when she was younger but is having 2 or 3/month now.  Relevant past medical, surgical, family and social history reviewed and updated as indicated. Allergies and medications reviewed and updated.  Past Medical History:  Diagnosis Date   ADD (attention deficit disorder)    Anxiety    Depression     History reviewed. No pertinent surgical history.  Review of Systems  Constitutional: Negative.  Negative for activity change, fatigue and fever.  HENT: Negative.   Eyes: Negative.   Respiratory: Negative.  Negative for cough.   Cardiovascular: Negative.  Negative for chest pain.  Gastrointestinal: Negative.  Negative for abdominal pain.  Endocrine: Negative.   Genitourinary: Negative.  Negative for dysuria.  Musculoskeletal: Negative.   Skin: Negative.   Neurological: Negative.   Psychiatric/Behavioral: Positive for dysphoric mood. The patient is nervous/anxious.     Allergies as of 01/29/2017      Reactions   Sulfa Antibiotics Nausea And Vomiting   Vioxx [rofecoxib]       Medication List        Accurate as of 01/29/17 11:59 PM. Always use your most recent med list.          ALPRAZolam 1 MG tablet Commonly  known as:  XANAX TAKE 1 TABLET 2 TO 3 TIMES DAILY AS NEEDED FOR ANXIETY   amphetamine-dextroamphetamine 20 MG 24 hr capsule Commonly known as:  ADDERALL XR Take 1 capsule (20 mg total) by mouth daily.   amphetamine-dextroamphetamine 20 MG 24 hr capsule Commonly known as:  ADDERALL XR Take 1 capsule (20 mg total) by mouth daily.   amphetamine-dextroamphetamine 20 MG 24 hr capsule Commonly known as:  ADDERALL XR Take 1 capsule (20 mg total) by mouth daily.   dicyclomine 10 MG capsule Commonly known as:  BENTYL Take 1 capsule (10 mg total) by mouth 4 (four) times daily -  before meals and at bedtime.   DULoxetine 30 MG capsule Commonly known as:  CYMBALTA Take 1 capsule (30 mg total) by mouth daily. After a week increase to 60 mg   HYDROcodone-acetaminophen 5-325 MG tablet Commonly known as:  NORCO/VICODIN hydrocodone 5 mg-acetaminophen 325 mg tablet   levothyroxine 88 MCG tablet Commonly known as:  SYNTHROID, LEVOTHROID TAKE 1 TABLET DAILY   meclizine 25 MG tablet Commonly known as:  ANTIVERT Take 1 tablet by mouth 3 (three) times daily as needed for dizziness.   meloxicam 15 MG tablet Commonly known as:  MOBIC Take 1 tablet (15 mg total) by mouth daily.   pantoprazole  40 MG tablet Commonly known as:  PROTONIX Take 1 tablet (40 mg total) by mouth daily.   sertraline 100 MG tablet Commonly known as:  ZOLOFT TAKE 1 TABLET DAILY   SUMAtriptan 50 MG tablet Commonly known as:  IMITREX Take 1-2 tablets (50-100 mg total) by mouth every 2 (two) hours as needed for migraine.   valACYclovir 500 MG tablet Commonly known as:  VALTREX Take 1 tablet (500 mg total) by mouth 2 (two) times daily.          Objective:    BP 114/81    Pulse 76    Temp 98 F (36.7 C) (Oral)    Ht 5\' 5"  (1.651 m)    Wt 160 lb 6.4 oz (72.8 kg)    BMI 26.69 kg/m   Allergies  Allergen Reactions   Sulfa Antibiotics Nausea And Vomiting   Vioxx [Rofecoxib]     Physical Exam  Constitutional:  She is oriented to person, place, and time. She appears well-developed and well-nourished.  HENT:  Head: Normocephalic and atraumatic.  Right Ear: Tympanic membrane, external ear and ear canal normal.  Left Ear: Tympanic membrane, external ear and ear canal normal.  Nose: Nose normal. No rhinorrhea.  Mouth/Throat: Oropharynx is clear and moist and mucous membranes are normal. No oropharyngeal exudate or posterior oropharyngeal erythema.  Eyes: Conjunctivae and EOM are normal. Pupils are equal, round, and reactive to light.  Neck: Normal range of motion. Neck supple.  Cardiovascular: Normal rate, regular rhythm, normal heart sounds and intact distal pulses.  Pulmonary/Chest: Effort normal and breath sounds normal.  Abdominal: Soft. Bowel sounds are normal.  Neurological: She is alert and oriented to person, place, and time. She has normal reflexes.  Skin: Skin is warm and dry. No rash noted.  Psychiatric: She has a normal mood and affect. Her behavior is normal. Judgment and thought content normal.        Assessment & Plan:   1. Attention deficit hyperactivity disorder (ADHD), predominantly inattentive type - amphetamine-dextroamphetamine (ADDERALL XR) 20 MG 24 hr capsule; Take 1 capsule (20 mg total) by mouth daily.  Dispense: 30 capsule; Refill: 0 - amphetamine-dextroamphetamine (ADDERALL XR) 20 MG 24 hr capsule; Take 1 capsule (20 mg total) by mouth daily.  Dispense: 30 capsule; Refill: 0 - amphetamine-dextroamphetamine (ADDERALL XR) 20 MG 24 hr capsule; Take 1 capsule (20 mg total) by mouth daily.  Dispense: 30 capsule; Refill: 0  2. Arthralgia of both lower legs - HYDROcodone-acetaminophen (NORCO/VICODIN) 5-325 MG tablet; hydrocodone 5 mg-acetaminophen 325 mg tablet - meloxicam (MOBIC) 15 MG tablet; Take 1 tablet (15 mg total) by mouth daily.  Dispense: 30 tablet; Refill: 11  3. Moderate episode of recurrent major depressive disorder (HCC) - DULoxetine (CYMBALTA) 30 MG capsule;  Take 1 capsule (30 mg total) by mouth daily. After a week increase to 60 mg  Dispense: 60 capsule; Refill: 3  4. Other migraine without status migrainosus, not intractable - SUMAtriptan (IMITREX) 50 MG tablet; Take 1-2 tablets (50-100 mg total) by mouth every 2 (two) hours as needed for migraine.  Dispense: 10 tablet; Refill: 5    Current Outpatient Medications:    ALPRAZolam (XANAX) 1 MG tablet, TAKE 1 TABLET 2 TO 3 TIMES DAILY AS NEEDED FOR ANXIETY, Disp: 90 tablet, Rfl: 5   amphetamine-dextroamphetamine (ADDERALL XR) 20 MG 24 hr capsule, Take 1 capsule (20 mg total) by mouth daily., Disp: 30 capsule, Rfl: 0   amphetamine-dextroamphetamine (ADDERALL XR) 20 MG 24 hr capsule, Take 1 capsule (  20 mg total) by mouth daily., Disp: 30 capsule, Rfl: 0   amphetamine-dextroamphetamine (ADDERALL XR) 20 MG 24 hr capsule, Take 1 capsule (20 mg total) by mouth daily., Disp: 30 capsule, Rfl: 0   dicyclomine (BENTYL) 10 MG capsule, Take 1 capsule (10 mg total) by mouth 4 (four) times daily -  before meals and at bedtime., Disp: 90 capsule, Rfl: 3   DULoxetine (CYMBALTA) 30 MG capsule, Take 1 capsule (30 mg total) by mouth daily. After a week increase to 60 mg, Disp: 60 capsule, Rfl: 3   HYDROcodone-acetaminophen (NORCO/VICODIN) 5-325 MG tablet, hydrocodone 5 mg-acetaminophen 325 mg tablet, Disp: , Rfl:    levothyroxine (SYNTHROID, LEVOTHROID) 88 MCG tablet, TAKE 1 TABLET DAILY, Disp: 30 tablet, Rfl: 6   meclizine (ANTIVERT) 25 MG tablet, Take 1 tablet by mouth 3 (three) times daily as needed for dizziness., Disp: 30 tablet, Rfl: 0   meloxicam (MOBIC) 15 MG tablet, Take 1 tablet (15 mg total) by mouth daily., Disp: 30 tablet, Rfl: 11   pantoprazole (PROTONIX) 40 MG tablet, Take 1 tablet (40 mg total) by mouth daily., Disp: 30 tablet, Rfl: 11   sertraline (ZOLOFT) 100 MG tablet, TAKE 1 TABLET DAILY, Disp: 30 tablet, Rfl: 1   SUMAtriptan (IMITREX) 50 MG tablet, Take 1-2 tablets (50-100 mg total) by  mouth every 2 (two) hours as needed for migraine., Disp: 10 tablet, Rfl: 5   valACYclovir (VALTREX) 500 MG tablet, Take 1 tablet (500 mg total) by mouth 2 (two) times daily., Disp: 30 tablet, Rfl: 11 Continue all other maintenance medications as listed above.  Follow up plan: Return in about 4 weeks (around 02/26/2017) for recheck.  Educational handout given for survey  Remus Loffler PA-C [facility] [address]  [place], [place] [address] [phone]   01/30/2017, 10:04 AM

## 2017-02-04 ENCOUNTER — Other Ambulatory Visit: Payer: Self-pay | Admitting: Physician Assistant

## 2017-02-04 DIAGNOSIS — R42 Dizziness and giddiness: Secondary | ICD-10-CM

## 2017-02-05 ENCOUNTER — Other Ambulatory Visit: Payer: Self-pay | Admitting: Physician Assistant

## 2017-02-05 ENCOUNTER — Telehealth: Payer: Self-pay | Admitting: *Deleted

## 2017-02-05 DIAGNOSIS — F900 Attention-deficit hyperactivity disorder, predominantly inattentive type: Secondary | ICD-10-CM

## 2017-02-05 MED ORDER — AMPHETAMINE-DEXTROAMPHET ER 20 MG PO CP24: 20.0000 mg | ORAL_CAPSULE | Freq: Every day | ORAL | 0 refills | Status: DC

## 2017-02-05 NOTE — Telephone Encounter (Signed)
sent °

## 2017-02-05 NOTE — Telephone Encounter (Signed)
[facility] from [facility] Needing to get the 3 Adderal prescriptions from 01/29/17 resent, they were not able to get them in to their system correctly.

## 2017-02-26 DIAGNOSIS — S86312D Strain of muscle(s) and tendon(s) of peroneal muscle group at lower leg level, left leg, subsequent encounter: Secondary | ICD-10-CM | POA: Diagnosis not present

## 2017-02-26 DIAGNOSIS — M25572 Pain in left ankle and joints of left foot: Secondary | ICD-10-CM | POA: Diagnosis not present

## 2017-04-09 ENCOUNTER — Other Ambulatory Visit: Payer: Self-pay | Admitting: Physician Assistant

## 2017-04-12 ENCOUNTER — Other Ambulatory Visit: Payer: Self-pay | Admitting: Physician Assistant

## 2017-04-25 ENCOUNTER — Ambulatory Visit: Payer: 59 | Admitting: Physician Assistant

## 2017-04-25 ENCOUNTER — Encounter: Payer: Self-pay | Admitting: Physician Assistant

## 2017-04-25 VITALS — BP 144/85 | HR 84 | Ht 65.0 in | Wt 152.6 lb

## 2017-04-25 DIAGNOSIS — M25562 Pain in left knee: Secondary | ICD-10-CM | POA: Diagnosis not present

## 2017-04-25 DIAGNOSIS — K047 Periapical abscess without sinus: Secondary | ICD-10-CM | POA: Diagnosis not present

## 2017-04-25 DIAGNOSIS — M25561 Pain in right knee: Secondary | ICD-10-CM

## 2017-04-25 DIAGNOSIS — F411 Generalized anxiety disorder: Secondary | ICD-10-CM

## 2017-04-25 DIAGNOSIS — F331 Major depressive disorder, recurrent, moderate: Secondary | ICD-10-CM | POA: Diagnosis not present

## 2017-04-25 DIAGNOSIS — F4322 Adjustment disorder with anxiety: Secondary | ICD-10-CM | POA: Diagnosis not present

## 2017-04-25 MED ORDER — TRAMADOL HCL 50 MG PO TABS: 100.0000 mg | ORAL_TABLET | Freq: Four times a day (QID) | ORAL | 0 refills | Status: DC | PRN

## 2017-04-25 MED ORDER — PENICILLIN V POTASSIUM 500 MG PO TABS: 500.0000 mg | ORAL_TABLET | Freq: Four times a day (QID) | ORAL | 2 refills | Status: DC

## 2017-04-25 NOTE — Patient Instructions (Addendum)
[facility] [phone]  Your provider wants you to schedule an appointment with a Psychologist/Psychiatrist. The following list of offices requires the patient to call and make their own appointment, as there is information they need that only you can provide. Please feel free to choose form the following providers:  [facility]   [phone] Crisis Recovery in [place] [phone]  [facility]  [phone] [place], [place]  (Scheduled through [facility]) Must call and do an interview for appointment. Sees Children / Accepts Medicaid  [facility]    [phone]  [address], [address]    [place], [place]       [facility]  [phone] [address] [place], [place]  Evaluates for Autism but does not treat it Sees Children / Accepts Medicaid  [facility]    [phone] [address]   [place], [place] Medication management, substance abuse, bipolar, grief, family, marriage, OCD, anxiety, PTSD Sees children / Accepts Medicaid  [facility]    [phone] [address] [place], [place] Sees children / Accepts [facility]  [facility]  [phone] [address] [place], [place]   Dr Estelle Grumbles     [phone] [address] [place], [place]  Sees ADD & ADHD for treatment Accepts Medicaid  [facility]  [phone] [place], [place] Evaluates for Autism Accepts [facility]  [facility]  [phone] [address] [place], [place]  Does Adult ADD evaluations Does not accept Medicaid  [facility]   [phone] [address] [place], [place] Uses animal therapy  Sees children as young as 27 years old Accepts [facility]     [phone]    [address]  [place], [place] [address] Sees children Accepts Medicaid   Plantar Fasciitis  Rehab Ask your health care provider which exercises are safe for you. Do exercises exactly as told by your health care provider and adjust them as directed. It is normal to feel mild stretching, pulling, tightness, or discomfort as you do these exercises, but you should stop right away if you feel sudden pain or your pain gets worse. Do not begin these exercises until told by your health care provider. Stretching and range of motion exercises These exercises warm up your muscles and joints and improve the movement and flexibility of your foot. These exercises also help to relieve pain. Exercise A: Plantar fascia stretch  1. Sit with your left / right leg crossed over your opposite knee. 2. Hold your heel with one hand with that thumb near your arch. With your other hand, hold your toes and gently pull them back toward the top of your foot. You should feel a stretch on the bottom of your toes or your foot or both. 3. Hold this stretch for__________ seconds. 4. Slowly release your toes and return to the starting position. Repeat __________ times. Complete this exercise __________ times a day. Exercise B: Gastroc, standing  1. Stand with your hands against a wall. 2. Extend your left / right leg behind you, and bend your front knee slightly. 3. Keeping your heels on the floor and keeping your back knee straight, shift your weight toward the wall without arching your back. You should feel a gentle stretch in your left / right calf. 4. Hold this position for __________ seconds. Repeat __________ times. Complete this exercise  __________ times a day. Exercise C: Soleus, standing 1. Stand with your hands against a wall. 2. Extend your left / right leg behind you, and bend your front knee slightly. 3. Keeping your heels on the floor, bend your back knee and slightly shift your weight over the back leg. You should feel a gentle stretch deep in your calf. 4. Hold this position for __________  seconds. Repeat __________ times. Complete this exercise __________ times a day. Exercise D: Gastrocsoleus, standing 1. Stand with the ball of your left / right foot on a step. The ball of your foot is on the walking surface, right under your toes. 2. Keep your other foot firmly on the same step. 3. Hold onto the wall or a railing for balance. 4. Slowly lift your other foot, allowing your body weight to press your heel down over the edge of the step. You should feel a stretch in your left / right calf. 5. Hold this position for __________ seconds. 6. Return both feet to the step. 7. Repeat this exercise with a slight bend in your left / right knee. Repeat __________ times with your left / right knee straight and __________ times with your left / right knee bent. Complete this exercise __________ times a day. Balance exercise This exercise builds your balance and strength control of your arch to help take pressure off your plantar fascia. Exercise E: Single leg stand 1. Without shoes, stand near a railing or in a doorway. You may hold onto the railing or door frame as needed. 2. Stand on your left / right foot. Keep your big toe down on the floor and try to keep your arch lifted. Do not let your foot roll inward. 3. Hold this position for __________ seconds. 4. If this exercise is too easy, you can try it with your eyes closed or while standing on a pillow. Repeat __________ times. Complete this exercise __________ times a day. This information is not intended to replace advice given to you by your health care provider. Make sure you discuss any questions you have with your health care provider. Document Released: 12/25/2004 Document Revised: 08/30/2015 Document Reviewed: 11/08/2014 Elsevier Interactive Patient Education  2018 [employer]

## 2017-04-28 NOTE — Progress Notes (Signed)
BP (!) 144/85    Pulse 84    Ht 5\' 5"  (1.651 m)    Wt 152 lb 9.6 oz (69.2 kg)    BMI 25.39 kg/m    Subjective:    Patient ID: Phyllis AloeJanet Ketter, female    DOB: [birth year], 57 y.o.   MRN: [id number]  HPI: Phyllis Parker is a 57 y.o. female presenting on 04/25/2017 for Depression and Panic Attack  Patient having very significant depression with anxiety at this time.  There are lots of stressors related to her health and finances.  She is a very stressful job.  Over the past couple days she has had very severe anxiety.  Last night she had such a severe anxiety attack that the almost went to the emergency room.  She is able to come in this morning. [facility] 04/25/2017 01/29/2017 11/06/2016 04/24/2016 11/29/2015  Decreased Interest 2 1 0 1 1  Down, Depressed, Hopeless 2 1 0 1 -  PHQ - 2 Score 4 2 0 2 1  Altered sleeping 2 1 - 1 1  Tired, decreased energy 2 1 - 1 1  Change in appetite 2 0 - 1 1  Feeling bad or failure about yourself  2 1 - 1 1  Trouble concentrating 2 1 - 1 1  Moving slowly or fidgety/restless 2 0 - 1 1  Suicidal thoughts 0 0 - 0 0  PHQ-9 Score 16 6 - 8 7    Also with dental abscess and no money to go to the dentist. Also with chronic pain related to her foot fracture this past year. Since going to work again the pain is severe.  Past Medical History:  Diagnosis Date   ADD (attention deficit disorder)    Anxiety    Depression    Relevant past medical, surgical, family and social history reviewed and updated as indicated. Interim medical history since our last visit reviewed. Allergies and medications reviewed and updated. DATA REVIEWED: CHART IN EPIC  Family History reviewed for pertinent findings.  Review of Systems  Constitutional: Negative.   HENT: Positive for dental problem.   Eyes: Negative.   Respiratory: Negative.   Gastrointestinal: Negative.   Genitourinary: Negative.   Musculoskeletal: Positive for arthralgias and gait problem.   Psychiatric/Behavioral: Positive for decreased concentration and sleep disturbance. Negative for suicidal ideas. The patient is nervous/anxious.     Allergies as of 04/25/2017      Reactions   Sulfa Antibiotics Nausea And Vomiting   Vioxx [rofecoxib]       Medication List        Accurate as of 04/25/17 11:59 PM. Always use your most recent med list.          ALPRAZolam 1 MG tablet Commonly known as:  XANAX TAKE 1 TABLET 2 TO 3 TIMES DAILY AS NEEDED FOR ANXIETY   dicyclomine 10 MG capsule Commonly known as:  BENTYL TAKE 1 CAPSULE FOUR TIMES DAILY BEFORE MEALS AND AT BEDTIME AS NEEDED   levothyroxine 88 MCG tablet Commonly known as:  SYNTHROID, LEVOTHROID TAKE 1 TABLET DAILY   meclizine 25 MG tablet Commonly known as:  ANTIVERT TAKE (1) TABLET THREE TIMES DAILY AS NEEDED FOR DIZZINESS.   meloxicam 15 MG tablet Commonly known as:  MOBIC Take 1 tablet (15 mg total) by mouth daily.   pantoprazole 40 MG tablet Commonly known as:  PROTONIX Take 1 tablet (40 mg total) by mouth daily.   penicillin v potassium 500 MG  tablet Commonly known as:  VEETID Take 1 tablet (500 mg total) by mouth 4 (four) times daily.   sertraline 100 MG tablet Commonly known as:  ZOLOFT TAKE 1 TABLET DAILY   SUMAtriptan 50 MG tablet Commonly known as:  IMITREX Take 1-2 tablets (50-100 mg total) by mouth every 2 (two) hours as needed for migraine.   traMADol 50 MG tablet Commonly known as:  ULTRAM Take 2 tablets (100 mg total) by mouth every 6 (six) hours as needed.   valACYclovir 500 MG tablet Commonly known as:  VALTREX Take 1 tablet (500 mg total) by mouth 2 (two) times daily.          Objective:    BP (!) 144/85    Pulse 84    Ht 5\' 5"  (1.651 m)    Wt 152 lb 9.6 oz (69.2 kg)    BMI 25.39 kg/m   Allergies  Allergen Reactions   Sulfa Antibiotics Nausea And Vomiting   Vioxx [Rofecoxib]     Wt Readings from Last 3 Encounters:  04/25/17 152 lb 9.6 oz (69.2 kg)  01/29/17 160  lb 6.4 oz (72.8 kg)  04/24/16 150 lb 3.2 oz (68.1 kg)    Physical Exam  Constitutional: She is oriented to person, place, and time. She appears well-developed and well-nourished.  HENT:  Head: Normocephalic and atraumatic.  Eyes: Pupils are equal, round, and reactive to light. Conjunctivae and EOM are normal.  Cardiovascular: Normal rate, regular rhythm, normal heart sounds and intact distal pulses.  Pulmonary/Chest: Effort normal and breath sounds normal.  Abdominal: Soft. Bowel sounds are normal.  Neurological: She is alert and oriented to person, place, and time. She has normal reflexes.  Skin: Skin is warm and dry. No rash noted.  Psychiatric: She has a normal mood and affect. Her behavior is normal. Judgment and thought content normal.        Assessment & Plan:   1. Adjustment disorder with anxious mood  2. Moderate episode of recurrent major depressive disorder (HCC)  3. GAD (generalized anxiety disorder)  4. Arthralgia of both lower legs - traMADol (ULTRAM) 50 MG tablet; Take 2 tablets (100 mg total) by mouth every 6 (six) hours as needed.  Dispense: 40 tablet; Refill: 0  5. Dental abscess - penicillin v potassium (VEETID) 500 MG tablet; Take 1 tablet (500 mg total) by mouth 4 (four) times daily.  Dispense: 40 tablet; Refill: 2    Continue all other maintenance medications as listed above.  Follow up plan: Return in about 1 month (around 05/23/2017) for recheck.  Educational handout given for survey  Remus Loffler PA-C [facility] [address]  [place], [place] [address] [phone]   04/28/2017, 11:24 PM

## 2017-05-09 ENCOUNTER — Other Ambulatory Visit: Payer: Self-pay | Admitting: Physician Assistant

## 2017-05-09 DIAGNOSIS — M25561 Pain in right knee: Secondary | ICD-10-CM

## 2017-05-09 DIAGNOSIS — M25562 Pain in left knee: Principal | ICD-10-CM

## 2017-05-27 ENCOUNTER — Other Ambulatory Visit: Payer: Self-pay | Admitting: Physician Assistant

## 2017-05-27 DIAGNOSIS — R42 Dizziness and giddiness: Secondary | ICD-10-CM

## 2017-05-28 ENCOUNTER — Other Ambulatory Visit: Payer: Self-pay | Admitting: Physician Assistant

## 2017-05-28 ENCOUNTER — Telehealth: Payer: Self-pay | Admitting: Physician Assistant

## 2017-05-28 NOTE — Telephone Encounter (Signed)
Last thyroid 04/24/16

## 2017-06-04 ENCOUNTER — Other Ambulatory Visit: Payer: Self-pay | Admitting: Physician Assistant

## 2017-06-04 DIAGNOSIS — M25562 Pain in left knee: Secondary | ICD-10-CM

## 2017-06-04 DIAGNOSIS — M25561 Pain in right knee: Secondary | ICD-10-CM

## 2017-06-10 ENCOUNTER — Ambulatory Visit: Payer: 59 | Admitting: Physician Assistant

## 2017-06-10 ENCOUNTER — Encounter: Payer: Self-pay | Admitting: Physician Assistant

## 2017-06-10 VITALS — BP 126/85 | HR 80 | Temp 97.6°F | Ht 65.0 in | Wt 154.0 lb

## 2017-06-10 DIAGNOSIS — M25561 Pain in right knee: Secondary | ICD-10-CM | POA: Diagnosis not present

## 2017-06-10 DIAGNOSIS — M25562 Pain in left knee: Secondary | ICD-10-CM | POA: Diagnosis not present

## 2017-06-10 DIAGNOSIS — Z0000 Encounter for general adult medical examination without abnormal findings: Secondary | ICD-10-CM

## 2017-06-10 DIAGNOSIS — F331 Major depressive disorder, recurrent, moderate: Secondary | ICD-10-CM

## 2017-06-10 DIAGNOSIS — F900 Attention-deficit hyperactivity disorder, predominantly inattentive type: Secondary | ICD-10-CM

## 2017-06-10 DIAGNOSIS — E039 Hypothyroidism, unspecified: Secondary | ICD-10-CM

## 2017-06-10 MED ORDER — SERTRALINE HCL 100 MG PO TABS: 100.0000 mg | ORAL_TABLET | Freq: Every day | ORAL | 11 refills | Status: DC

## 2017-06-10 MED ORDER — TRAMADOL HCL 50 MG PO TABS: ORAL_TABLET | ORAL | 2 refills | Status: DC

## 2017-06-10 MED ORDER — METHYLPHENIDATE HCL ER (LA) 20 MG PO CP24: 20.0000 mg | ORAL_CAPSULE | ORAL | 0 refills | Status: DC

## 2017-06-10 NOTE — Patient Instructions (Signed)
In a few days you may receive a survey in the mail or online from [employer] regarding your visit with us today. Please take a moment to fill this out. Your feedback is very important to our whole office. It can help us better understand your needs as well as improve your experience and satisfaction. Thank you for taking your time to complete it. We care about you.  Prudy FeelerAngel Sarely Stracener, PA-C

## 2017-06-11 ENCOUNTER — Other Ambulatory Visit: Payer: Self-pay | Admitting: Physician Assistant

## 2017-06-11 LAB — CMP14+EGFR
ALT: 6 IU/L (ref 0–32)
AST: 17 IU/L (ref 0–40)
Albumin/Globulin Ratio: 2 (ref 1.2–2.2)
Albumin: 4.6 g/dL (ref 3.5–5.5)
Alkaline Phosphatase: 67 IU/L (ref 39–117)
BUN/Creatinine Ratio: 19 (ref 9–23)
BUN: 16 mg/dL (ref 6–24)
Bilirubin Total: 0.2 mg/dL (ref 0.0–1.2)
CALCIUM: 9.9 mg/dL (ref 8.7–10.2)
CHLORIDE: 105 mmol/L (ref 96–106)
CO2: 22 mmol/L (ref 20–29)
Creatinine, Ser: 0.84 mg/dL (ref 0.57–1.00)
GFR calc Af Amer: 91 mL/min/{1.73_m2} (ref >59)
GFR, EST NON AFRICAN AMERICAN: 79 mL/min/{1.73_m2} (ref >59)
Globulin, Total: 2.3 g/dL (ref 1.5–4.5)
Glucose: 76 mg/dL (ref 65–99)
Potassium: 4 mmol/L (ref 3.5–5.2)
Sodium: 144 mmol/L (ref 134–144)
Total Protein: 6.9 g/dL (ref 6.0–8.5)

## 2017-06-11 LAB — CBC WITH DIFFERENTIAL/PLATELET
BASOS ABS: 0 10*3/uL (ref 0.0–0.2)
BASOS: 1 %
EOS (ABSOLUTE): 0.2 10*3/uL (ref 0.0–0.4)
Eos: 5 %
HEMATOCRIT: 37 % (ref 34.0–46.6)
HEMOGLOBIN: 12.5 g/dL (ref 11.1–15.9)
IMMATURE GRANS (ABS): 0 10*3/uL (ref 0.0–0.1)
Immature Granulocytes: 1 %
LYMPHS ABS: 1.4 10*3/uL (ref 0.7–3.1)
LYMPHS: 31 %
MCH: 30.8 pg (ref 26.6–33.0)
MCHC: 33.8 g/dL (ref 31.5–35.7)
MCV: 91 fL (ref 79–97)
Monocytes Absolute: 0.3 10*3/uL (ref 0.1–0.9)
Monocytes: 5 %
NEUTROS ABS: 2.7 10*3/uL (ref 1.4–7.0)
NRBC: 2 % — ABNORMAL HIGH (ref 0–0)
Neutrophils: 57 %
Platelets: 266 10*3/uL (ref 150–450)
RBC: 4.06 x10E6/uL (ref 3.77–5.28)
RDW: 13.3 % (ref 12.3–15.4)
WBC: 4.6 10*3/uL (ref 3.4–10.8)

## 2017-06-11 LAB — LIPID PANEL
CHOL/HDL RATIO: 3.4 ratio (ref 0.0–4.4)
Cholesterol, Total: 269 mg/dL — ABNORMAL HIGH (ref 100–199)
HDL: 79 mg/dL (ref >39)
LDL Calculated: 178 mg/dL — ABNORMAL HIGH (ref 0–99)
Triglycerides: 61 mg/dL (ref 0–149)
VLDL CHOLESTEROL CAL: 12 mg/dL (ref 5–40)

## 2017-06-11 LAB — TSH: TSH: 6.9 u[IU]/mL — ABNORMAL HIGH (ref 0.450–4.500)

## 2017-06-11 MED ORDER — LEVOTHYROXINE SODIUM 100 MCG PO TABS: 100.0000 ug | ORAL_TABLET | Freq: Every day | ORAL | 2 refills | Status: DC

## 2017-06-11 NOTE — Progress Notes (Signed)
BP 126/85    Pulse 80    Temp 97.6 F (36.4 C) (Oral)    Ht 5' 5"  (1.651 m)    Wt 154 lb (69.9 kg)    BMI 25.63 kg/m    Subjective:    Patient ID: Phyllis Parker, female    DOB: 1960/09/26, 57 y.o.   MRN: [id number]  HPI: Phyllis Parker is a 57 y.o. female presenting on 06/10/2017 for Depression (1 month follow up ) and Hypothyroidism (needs labs today)  This patient comes in for one-month follow-up on her depression.  She reports she is doing much better with her depression.  She has been able to do some activities.  She felt like she had to make herself go but when she was there she was glad she did and was quite active through the day.  She really wants to get back started on her walking regimen.  She also needs some blood work performed for her thyroid and annual labs.  She had been taking Adderall for couple years for her attention deficit but is wondering if something else might work better.  She has never taken anything Ritalin based.  So we will start the medication and recheck her in 1 months on this.  Past Medical History:  Diagnosis Date   ADD (attention deficit disorder)    Anxiety    Depression    Relevant past medical, surgical, family and social history reviewed and updated as indicated. Interim medical history since our last visit reviewed. Allergies and medications reviewed and updated. DATA REVIEWED: CHART IN EPIC  Family History reviewed for pertinent findings.  Review of Systems  Constitutional: Negative.  Negative for activity change, fatigue and fever.  HENT: Negative.   Eyes: Negative.   Respiratory: Negative.  Negative for cough.   Cardiovascular: Negative.  Negative for chest pain.  Gastrointestinal: Negative.  Negative for abdominal pain.  Endocrine: Negative.   Genitourinary: Negative.  Negative for dysuria.  Musculoskeletal: Negative.   Skin: Negative.   Neurological: Negative.     Allergies as of 06/10/2017      Reactions   Sulfa Antibiotics Nausea And  Vomiting   Vioxx [rofecoxib]       Medication List        Accurate as of 06/10/17 11:59 PM. Always use your most recent med list.          ALPRAZolam 1 MG tablet Commonly known as:  XANAX TAKE 1 TABLET 2 TO 3 TIMES DAILY AS NEEDED FOR ANXIETY   buPROPion 150 MG 24 hr tablet Commonly known as:  WELLBUTRIN XL Take 150 mg by mouth daily.   dicyclomine 10 MG capsule Commonly known as:  BENTYL TAKE 1 CAPSULE FOUR TIMES DAILY BEFORE MEALS AND AT BEDTIME AS NEEDED   levothyroxine 88 MCG tablet Commonly known as:  SYNTHROID, LEVOTHROID TAKE 1 TABLET DAILY   meclizine 25 MG tablet Commonly known as:  ANTIVERT TAKE (1) TABLET THREE TIMES DAILY AS NEEDED FOR DIZZINESS.   meloxicam 15 MG tablet Commonly known as:  MOBIC Take 1 tablet (15 mg total) by mouth daily.   methylphenidate 20 MG 24 hr capsule Commonly known as:  RITALIN LA Take 1 capsule (20 mg total) by mouth every morning.   pantoprazole 40 MG tablet Commonly known as:  PROTONIX Take 1 tablet (40 mg total) by mouth daily.   penicillin v potassium 500 MG tablet Commonly known as:  VEETID Take 1 tablet (500 mg total) by mouth 4 (four) times  daily.   sertraline 100 MG tablet Commonly known as:  ZOLOFT Take 1 tablet (100 mg total) by mouth daily.   SUMAtriptan 50 MG tablet Commonly known as:  IMITREX Take 1-2 tablets (50-100 mg total) by mouth every 2 (two) hours as needed for migraine.   traMADol 50 MG tablet Commonly known as:  ULTRAM TAKE (2) TABLETS EVERY SIX HOURS AS NEEDED.          Objective:    BP 126/85    Pulse 80    Temp 97.6 F (36.4 C) (Oral)    Ht 5' 5"  (1.651 m)    Wt 154 lb (69.9 kg)    BMI 25.63 kg/m   Allergies  Allergen Reactions   Sulfa Antibiotics Nausea And Vomiting   Vioxx [Rofecoxib]     Wt Readings from Last 3 Encounters:  06/10/17 154 lb (69.9 kg)  04/25/17 152 lb 9.6 oz (69.2 kg)  01/29/17 160 lb 6.4 oz (72.8 kg)    Physical Exam  Constitutional: She is oriented to  person, place, and time. She appears well-developed and well-nourished.  HENT:  Head: Normocephalic and atraumatic.  Eyes: Pupils are equal, round, and reactive to light. Conjunctivae and EOM are normal.  Cardiovascular: Normal rate, regular rhythm, normal heart sounds and intact distal pulses.  Pulmonary/Chest: Effort normal and breath sounds normal.  Abdominal: Soft. Bowel sounds are normal.  Neurological: She is alert and oriented to person, place, and time. She has normal reflexes.  Skin: Skin is warm and dry. No rash noted.  Psychiatric: She has a normal mood and affect. Her behavior is normal. Judgment and thought content normal.    Results for orders placed or performed in visit on 06/10/17  CBC with Differential/Platelet  Result Value Ref Range   WBC 4.6 3.4 - 10.8 x10E3/uL   RBC 4.06 3.77 - 5.28 x10E6/uL   Hemoglobin 12.5 11.1 - 15.9 g/dL   Hematocrit 37.0 34.0 - 46.6 %   MCV 91 79 - 97 fL   MCH 30.8 26.6 - 33.0 pg   MCHC 33.8 31.5 - 35.7 g/dL   RDW 13.3 12.3 - 15.4 %   Platelets 266 150 - 450 x10E3/uL   Neutrophils 57 Not Estab. %   Lymphs 31 Not Estab. %   Monocytes 5 Not Estab. %   Eos 5 Not Estab. %   Basos 1 Not Estab. %   Neutrophils Absolute 2.7 1.4 - 7.0 x10E3/uL   Lymphocytes Absolute 1.4 0.7 - 3.1 x10E3/uL   Monocytes Absolute 0.3 0.1 - 0.9 x10E3/uL   EOS (ABSOLUTE) 0.2 0.0 - 0.4 x10E3/uL   Basophils Absolute 0.0 0.0 - 0.2 x10E3/uL   Immature Granulocytes 1 Not Estab. %   Immature Grans (Abs) 0.0 0.0 - 0.1 x10E3/uL   NRBC 2 (H) 0 - 0 %   Hematology Comments: Note:   CMP14+EGFR  Result Value Ref Range   Glucose 76 65 - 99 mg/dL   BUN 16 6 - 24 mg/dL   Creatinine, Ser 0.84 0.57 - 1.00 mg/dL   GFR calc non Af Amer 79 >59 mL/min/1.73   GFR calc Af Amer 91 >59 mL/min/1.73   BUN/Creatinine Ratio 19 9 - 23   Sodium 144 134 - 144 mmol/L   Potassium 4.0 3.5 - 5.2 mmol/L   Chloride 105 96 - 106 mmol/L   CO2 22 20 - 29 mmol/L   Calcium 9.9 8.7 - 10.2 mg/dL    Total Protein 6.9 6.0 - 8.5 g/dL  Albumin 4.6 3.5 - 5.5 g/dL   Globulin, Total 2.3 1.5 - 4.5 g/dL   Albumin/Globulin Ratio 2.0 1.2 - 2.2   Bilirubin Total 0.2 0.0 - 1.2 mg/dL   Alkaline Phosphatase 67 39 - 117 IU/L   AST 17 0 - 40 IU/L   ALT 6 0 - 32 IU/L  Lipid panel  Result Value Ref Range   Cholesterol, Total 269 (H) 100 - 199 mg/dL   Triglycerides 61 0 - 149 mg/dL   HDL 79 >39 mg/dL   VLDL Cholesterol Cal 12 5 - 40 mg/dL   LDL Calculated 178 (H) 0 - 99 mg/dL   Chol/HDL Ratio 3.4 0.0 - 4.4 ratio  TSH  Result Value Ref Range   TSH 6.900 (H) 0.450 - 4.500 uIU/mL      Assessment & Plan:   1. Hypothyroidism, unspecified type - TSH  2. Moderate episode of recurrent major depressive disorder (HCC) - buPROPion (WELLBUTRIN XL) 150 MG 24 hr tablet; Take 150 mg by mouth daily. - sertraline (ZOLOFT) 100 MG tablet; Take 1 tablet (100 mg total) by mouth daily.  Dispense: 30 tablet; Refill: 11  3. Attention deficit hyperactivity disorder (ADHD), predominantly inattentive type - methylphenidate (RITALIN LA) 20 MG 24 hr capsule; Take 1 capsule (20 mg total) by mouth every morning.  Dispense: 30 capsule; Refill: 0  4. Arthralgia of both lower legs - traMADol (ULTRAM) 50 MG tablet; TAKE (2) TABLETS EVERY SIX HOURS AS NEEDED.  Dispense: 40 tablet; Refill: 2  5. Well adult exam - CBC with Differential/Platelet - CMP14+EGFR - Lipid panel - TSH   Continue all other maintenance medications as listed above.  Follow up plan: Return in about 1 month (around 07/08/2017) for recheck.  Educational handout given for [place] PA-C [place] [address]  [place], [place] [address] [phone]   06/11/2017, 8:24 AM

## 2017-06-19 ENCOUNTER — Encounter: Payer: Self-pay | Admitting: *Deleted

## 2017-07-10 ENCOUNTER — Ambulatory Visit: Payer: 59 | Admitting: Physician Assistant

## 2017-08-01 ENCOUNTER — Other Ambulatory Visit: Payer: Self-pay | Admitting: Physician Assistant

## 2017-08-01 DIAGNOSIS — R42 Dizziness and giddiness: Secondary | ICD-10-CM

## 2017-08-26 ENCOUNTER — Other Ambulatory Visit: Payer: Self-pay | Admitting: Physician Assistant

## 2017-08-27 ENCOUNTER — Other Ambulatory Visit: Payer: Self-pay | Admitting: Physician Assistant

## 2017-08-27 MED ORDER — ALPRAZOLAM 1 MG PO TABS: ORAL_TABLET | ORAL | 0 refills | Status: DC

## 2017-08-27 NOTE — Telephone Encounter (Signed)
appt made °

## 2017-08-29 ENCOUNTER — Ambulatory Visit: Payer: 59 | Admitting: Physician Assistant

## 2017-09-04 ENCOUNTER — Other Ambulatory Visit: Payer: Self-pay | Admitting: Physician Assistant

## 2017-09-04 DIAGNOSIS — G43809 Other migraine, not intractable, without status migrainosus: Secondary | ICD-10-CM

## 2017-09-13 ENCOUNTER — Other Ambulatory Visit: Payer: Self-pay | Admitting: Physician Assistant

## 2017-09-17 ENCOUNTER — Ambulatory Visit: Payer: 59 | Admitting: Physician Assistant

## 2017-09-17 ENCOUNTER — Encounter: Payer: Self-pay | Admitting: Physician Assistant

## 2017-09-17 VITALS — BP 139/88 | HR 75 | Temp 98.5°F | Ht 65.0 in | Wt 156.2 lb

## 2017-09-17 DIAGNOSIS — F900 Attention-deficit hyperactivity disorder, predominantly inattentive type: Secondary | ICD-10-CM

## 2017-09-17 DIAGNOSIS — F99 Mental disorder, not otherwise specified: Secondary | ICD-10-CM | POA: Diagnosis not present

## 2017-09-17 DIAGNOSIS — F5105 Insomnia due to other mental disorder: Secondary | ICD-10-CM | POA: Diagnosis not present

## 2017-09-17 DIAGNOSIS — M8580 Other specified disorders of bone density and structure, unspecified site: Secondary | ICD-10-CM

## 2017-09-17 MED ORDER — ATORVASTATIN CALCIUM 10 MG PO TABS: 10.0000 mg | ORAL_TABLET | Freq: Every day | ORAL | 3 refills | Status: DC

## 2017-09-17 MED ORDER — METHYLPHENIDATE HCL ER (LA) 20 MG PO CP24: 20.0000 mg | ORAL_CAPSULE | ORAL | 0 refills | Status: DC

## 2017-09-17 MED ORDER — AMITRIPTYLINE HCL 10 MG PO TABS: 10.0000 mg | ORAL_TABLET | Freq: Every day | ORAL | 1 refills | Status: DC

## 2017-09-17 NOTE — Progress Notes (Signed)
BP 139/88    Pulse 75    Temp 98.5 F (36.9 C) (Oral)    Ht 5' 5"  (1.651 m)    Wt 156 lb 3.2 oz (70.9 kg)    BMI 25.99 kg/m    Subjective:    Patient ID: Phyllis Parker, female    DOB: [birth year], 57 y.o.   MRN: [id number]  HPI: Phyllis Parker is a 57 y.o. female presenting on 09/17/2017 for Anxiety and Hypothyroidism  Patient comes in for recheck on her chronic medical conditions which do include depression, anxiety, hypothyroidism, ADHD.  She reports that she is also having a lot of difficulty with insomnia.  She has a lot of things going on and it is keeping her awake a lot.  She states overall her depression anxiety are fairly stable except for those stressful situations.  Her ADD medicine is very beneficial when she is trying to concentrate at work and do her job which is very detailed.  We reviewed all of her medication we will send refills as needed.  Past Medical History:  Diagnosis Date   ADD (attention deficit disorder)    Anxiety    Depression    Relevant past medical, surgical, family and social history reviewed and updated as indicated. Interim medical history since our last visit reviewed. Allergies and medications reviewed and updated. DATA REVIEWED: CHART IN EPIC  Family History reviewed for pertinent findings.  Review of Systems  Constitutional: Negative.   HENT: Negative.   Eyes: Negative.   Respiratory: Negative.   Gastrointestinal: Negative.   Genitourinary: Negative.   Musculoskeletal: Positive for arthralgias and joint swelling.  Psychiatric/Behavioral: Positive for decreased concentration, dysphoric mood and sleep disturbance. Negative for agitation, self-injury and suicidal ideas. The patient is nervous/anxious.     Allergies as of 09/17/2017      Reactions   Sulfa Antibiotics Nausea And Vomiting   Vioxx [rofecoxib]       Medication List        Accurate as of 09/17/17  4:43 PM. Always use your most recent med list.          ALPRAZolam 1 MG  tablet Commonly known as:  XANAX TAKE 1 TABLET 2 TO 3 TIMES DAILY AS NEEDED FOR ANXIETY   amitriptyline 10 MG tablet Commonly known as:  ELAVIL Take 1-2 tablets (10-20 mg total) by mouth at bedtime.   atorvastatin 10 MG tablet Commonly known as:  LIPITOR Take 1 tablet (10 mg total) by mouth daily.   buPROPion 150 MG 24 hr tablet Commonly known as:  WELLBUTRIN XL Take 150 mg by mouth daily.   dicyclomine 10 MG capsule Commonly known as:  BENTYL TAKE 1 CAPSULE FOUR TIMES DAILY BEFORE MEALS AND AT BEDTIME AS NEEDED   levothyroxine 100 MCG tablet Commonly known as:  SYNTHROID, LEVOTHROID TAKE 1 TABLET DAILY   meclizine 25 MG tablet Commonly known as:  ANTIVERT TAKE (1) TABLET THREE TIMES DAILY AS NEEDED FOR DIZZINESS.   meloxicam 15 MG tablet Commonly known as:  MOBIC Take 1 tablet (15 mg total) by mouth daily.   methylphenidate 20 MG 24 hr capsule Commonly known as:  RITALIN LA Take 1 capsule (20 mg total) by mouth every morning.   methylphenidate 20 MG 24 hr capsule Commonly known as:  RITALIN LA Take 1 capsule (20 mg total) by mouth every morning.   methylphenidate 20 MG 24 hr capsule Commonly known as:  RITALIN LA Take 1 capsule (20 mg total) by mouth every morning.  pantoprazole 40 MG tablet Commonly known as:  PROTONIX Take 1 tablet (40 mg total) by mouth daily.   sertraline 100 MG tablet Commonly known as:  ZOLOFT Take 1 tablet (100 mg total) by mouth daily.   SUMAtriptan 50 MG tablet Commonly known as:  IMITREX TAKE 1 OR 2 TABLETS AT ONSET OF MIGRAINE. MAY REPEAT IN 2 HOURS IF NEEDED   traMADol 50 MG tablet Commonly known as:  ULTRAM TAKE (2) TABLETS EVERY SIX HOURS AS NEEDED.          Objective:    BP 139/88    Pulse 75    Temp 98.5 F (36.9 C) (Oral)    Ht 5' 5"  (1.651 m)    Wt 156 lb 3.2 oz (70.9 kg)    BMI 25.99 kg/m   Allergies  Allergen Reactions   Sulfa Antibiotics Nausea And Vomiting   Vioxx [Rofecoxib]     Wt Readings from  Last 3 Encounters:  09/17/17 156 lb 3.2 oz (70.9 kg)  06/10/17 154 lb (69.9 kg)  04/25/17 152 lb 9.6 oz (69.2 kg)    Physical Exam  Constitutional: She is oriented to person, place, and time. She appears well-developed and well-nourished.  HENT:  Head: Normocephalic and atraumatic.  Eyes: Pupils are equal, round, and reactive to light. Conjunctivae and EOM are normal.  Cardiovascular: Normal rate, regular rhythm, normal heart sounds and intact distal pulses.  Pulmonary/Chest: Effort normal and breath sounds normal.  Abdominal: Soft. Bowel sounds are normal.  Neurological: She is alert and oriented to person, place, and time. She has normal reflexes.  Skin: Skin is warm and dry. No rash noted.  Psychiatric: She has a normal mood and affect. Her behavior is normal. Judgment and thought content normal.    Results for orders placed or performed in visit on 06/10/17  CBC with Differential/Platelet  Result Value Ref Range   WBC 4.6 3.4 - 10.8 x10E3/uL   RBC 4.06 3.77 - 5.28 x10E6/uL   Hemoglobin 12.5 11.1 - 15.9 g/dL   Hematocrit 37.0 34.0 - 46.6 %   MCV 91 79 - 97 fL   MCH 30.8 26.6 - 33.0 pg   MCHC 33.8 31.5 - 35.7 g/dL   RDW 13.3 12.3 - 15.4 %   Platelets 266 150 - 450 x10E3/uL   Neutrophils 57 Not Estab. %   Lymphs 31 Not Estab. %   Monocytes 5 Not Estab. %   Eos 5 Not Estab. %   Basos 1 Not Estab. %   Neutrophils Absolute 2.7 1.4 - 7.0 x10E3/uL   Lymphocytes Absolute 1.4 0.7 - 3.1 x10E3/uL   Monocytes Absolute 0.3 0.1 - 0.9 x10E3/uL   EOS (ABSOLUTE) 0.2 0.0 - 0.4 x10E3/uL   Basophils Absolute 0.0 0.0 - 0.2 x10E3/uL   Immature Granulocytes 1 Not Estab. %   Immature Grans (Abs) 0.0 0.0 - 0.1 x10E3/uL   NRBC 2 (H) 0 - 0 %   Hematology Comments: Note:   CMP14+EGFR  Result Value Ref Range   Glucose 76 65 - 99 mg/dL   BUN 16 6 - 24 mg/dL   Creatinine, Ser 0.84 0.57 - 1.00 mg/dL   GFR calc non Af Amer 79 >59 mL/min/1.73   GFR calc Af Amer 91 >59 mL/min/1.73   BUN/Creatinine  Ratio 19 9 - 23   Sodium 144 134 - 144 mmol/L   Potassium 4.0 3.5 - 5.2 mmol/L   Chloride 105 96 - 106 mmol/L   CO2 22 20 - 29 mmol/L  Calcium 9.9 8.7 - 10.2 mg/dL   Total Protein 6.9 6.0 - 8.5 g/dL   Albumin 4.6 3.5 - 5.5 g/dL   Globulin, Total 2.3 1.5 - 4.5 g/dL   Albumin/Globulin Ratio 2.0 1.2 - 2.2   Bilirubin Total 0.2 0.0 - 1.2 mg/dL   Alkaline Phosphatase 67 39 - 117 IU/L   AST 17 0 - 40 IU/L   ALT 6 0 - 32 IU/L  Lipid panel  Result Value Ref Range   Cholesterol, Total 269 (H) 100 - 199 mg/dL   Triglycerides 61 0 - 149 mg/dL   HDL 79 >39 mg/dL   VLDL Cholesterol Cal 12 5 - 40 mg/dL   LDL Calculated 178 (H) 0 - 99 mg/dL   Chol/HDL Ratio 3.4 0.0 - 4.4 ratio  TSH  Result Value Ref Range   TSH 6.900 (H) 0.450 - 4.500 uIU/mL      Assessment & Plan:   1. Attention deficit hyperactivity disorder (ADHD), predominantly inattentive type - methylphenidate (RITALIN LA) 20 MG 24 hr capsule; Take 1 capsule (20 mg total) by mouth every morning.  Dispense: 30 capsule; Refill: 0 - methylphenidate (RITALIN LA) 20 MG 24 hr capsule; Take 1 capsule (20 mg total) by mouth every morning.  Dispense: 30 capsule; Refill: 0 - methylphenidate (RITALIN LA) 20 MG 24 hr capsule; Take 1 capsule (20 mg total) by mouth every morning.  Dispense: 30 capsule; Refill: 0  2. Osteopenia, unspecified location - DG [facility] DEXA; Future  3. Insomnia due to other mental disorder - amitriptyline (ELAVIL) 10 MG tablet; Take 1-2 tablets (10-20 mg total) by mouth at bedtime.  Dispense: 180 tablet; Refill: 1   Continue all other maintenance medications as listed above.  Follow up plan: Return in about 3 months (around 12/17/2017).  Educational handout given for [place] PA-C [place] [address]  [place], [place] [address] [phone]   09/17/2017, 4:43 PM

## 2017-09-25 ENCOUNTER — Other Ambulatory Visit: Payer: Self-pay | Admitting: Physician Assistant

## 2017-10-30 ENCOUNTER — Other Ambulatory Visit: Payer: Self-pay | Admitting: Physician Assistant

## 2017-10-30 DIAGNOSIS — G43809 Other migraine, not intractable, without status migrainosus: Secondary | ICD-10-CM

## 2017-10-30 DIAGNOSIS — R42 Dizziness and giddiness: Secondary | ICD-10-CM

## 2017-11-13 ENCOUNTER — Other Ambulatory Visit: Payer: Self-pay | Admitting: Physician Assistant

## 2017-11-13 DIAGNOSIS — M25561 Pain in right knee: Secondary | ICD-10-CM

## 2017-11-13 DIAGNOSIS — M25562 Pain in left knee: Principal | ICD-10-CM

## 2017-12-18 ENCOUNTER — Encounter: Payer: Self-pay | Admitting: Physician Assistant

## 2017-12-18 ENCOUNTER — Ambulatory Visit: Payer: 59 | Admitting: Physician Assistant

## 2017-12-18 VITALS — BP 123/78 | HR 110 | Temp 97.0°F | Ht 65.0 in | Wt 157.2 lb

## 2017-12-18 DIAGNOSIS — Z23 Encounter for immunization: Secondary | ICD-10-CM | POA: Diagnosis not present

## 2017-12-18 DIAGNOSIS — H00015 Hordeolum externum left lower eyelid: Secondary | ICD-10-CM

## 2017-12-18 DIAGNOSIS — F900 Attention-deficit hyperactivity disorder, predominantly inattentive type: Secondary | ICD-10-CM | POA: Diagnosis not present

## 2017-12-18 DIAGNOSIS — F99 Mental disorder, not otherwise specified: Secondary | ICD-10-CM | POA: Insufficient documentation

## 2017-12-18 DIAGNOSIS — F5105 Insomnia due to other mental disorder: Secondary | ICD-10-CM

## 2017-12-18 DIAGNOSIS — M25561 Pain in right knee: Secondary | ICD-10-CM | POA: Diagnosis not present

## 2017-12-18 DIAGNOSIS — E039 Hypothyroidism, unspecified: Secondary | ICD-10-CM | POA: Diagnosis not present

## 2017-12-18 DIAGNOSIS — M25562 Pain in left knee: Secondary | ICD-10-CM

## 2017-12-18 MED ORDER — AMPHETAMINE-DEXTROAMPHET ER 10 MG PO CP24: 10.0000 mg | ORAL_CAPSULE | Freq: Every day | ORAL | 0 refills | Status: DC

## 2017-12-18 MED ORDER — TRAMADOL HCL 50 MG PO TABS: 100.0000 mg | ORAL_TABLET | Freq: Four times a day (QID) | ORAL | 5 refills | Status: DC

## 2017-12-18 MED ORDER — TEMAZEPAM 7.5 MG PO CAPS: 7.5000 mg | ORAL_CAPSULE | Freq: Every evening | ORAL | 5 refills | Status: DC | PRN

## 2017-12-18 MED ORDER — ERYTHROMYCIN 5 MG/GM OP OINT: 1.0000 "application " | TOPICAL_OINTMENT | Freq: Three times a day (TID) | OPHTHALMIC | 0 refills | Status: DC

## 2017-12-18 NOTE — Progress Notes (Signed)
BP 123/78    Pulse (!) 110    Temp (!) 97 F (36.1 C) (Oral)    Ht 5\' 5"  (1.651 m)    Wt 157 lb 3.2 oz (71.3 kg)    BMI 26.16 kg/m    Subjective:    Patient ID: Phyllis Parker, female    DOB: 1960-08-15, 57 y.o.   MRN: [id number]  HPI: Phyllis Parker is a 57 y.o. female presenting on 12/18/2017 for Hypothyroidism (3 month follow up ); Depression; and Stye (left eye )  This patient comes in for periodic recheck on medications and conditions including chronic pain in lower leg after trauma, ADHD, hypothyroidism, insomnia. She has a new problem of a sty on her left lower lid. It has been there for about a week. Nothing has helped.  She reports overall that her conditions are stable and she does need refills on her medication.  She has no other complaints today.  All medications are reviewed today. There are no reports of any problems with the medications. All of the medical conditions are reviewed and updated.  Lab work is reviewed and will be ordered as medically necessary. There are no new problems reported with today's visit.   Past Medical History:  Diagnosis Date   ADD (attention deficit disorder)    Anxiety    Depression    Relevant past medical, surgical, family and social history reviewed and updated as indicated. Interim medical history since our last visit reviewed. Allergies and medications reviewed and updated. DATA REVIEWED: CHART IN EPIC  Family History reviewed for pertinent findings.  Review of Systems  Constitutional: Positive for fatigue. Negative for activity change and fever.  HENT: Negative.   Eyes: Positive for pain.  Respiratory: Negative.  Negative for cough.   Cardiovascular: Negative.  Negative for chest pain.  Gastrointestinal: Negative.  Negative for abdominal pain.  Endocrine: Negative.   Genitourinary: Negative.  Negative for dysuria.  Musculoskeletal: Negative.   Skin: Negative.   Neurological: Negative.   Psychiatric/Behavioral: Positive for decreased  concentration, dysphoric mood and sleep disturbance. The patient is nervous/anxious.     Allergies as of 12/18/2017      Reactions   Sulfa Antibiotics Nausea And Vomiting   Vioxx [rofecoxib]       Medication List        Accurate as of 12/18/17  1:42 PM. Always use your most recent med list.          ALPRAZolam 1 MG tablet Commonly known as:  XANAX TAKE 1 TABLET 2 TO 3 TIMES DAILY AS NEEDED FOR ANXIETY   amphetamine-dextroamphetamine 10 MG 24 hr capsule Commonly known as:  ADDERALL XR Take 1 capsule (10 mg total) by mouth daily.   amphetamine-dextroamphetamine 10 MG 24 hr capsule Commonly known as:  ADDERALL XR Take 1 capsule (10 mg total) by mouth daily.   amphetamine-dextroamphetamine 10 MG 24 hr capsule Commonly known as:  ADDERALL XR Take 1 capsule (10 mg total) by mouth daily.   atorvastatin 10 MG tablet Commonly known as:  LIPITOR Take 1 tablet (10 mg total) by mouth daily.   buPROPion 150 MG 24 hr tablet Commonly known as:  WELLBUTRIN XL Take 150 mg by mouth daily.   dicyclomine 10 MG capsule Commonly known as:  BENTYL TAKE 1 CAPSULE FOUR TIMES DAILY BEFORE MEALS AND AT BEDTIME AS NEEDED   erythromycin ophthalmic ointment Place 1 application into the left eye 3 (three) times daily.   levothyroxine 100 MCG tablet Commonly known  as:  SYNTHROID, LEVOTHROID TAKE 1 TABLET DAILY   meclizine 25 MG tablet Commonly known as:  ANTIVERT TAKE (1) TABLET THREE TIMES DAILY AS NEEDED FOR DIZZINESS.   meloxicam 15 MG tablet Commonly known as:  MOBIC Take 1 tablet (15 mg total) by mouth daily.   pantoprazole 40 MG tablet Commonly known as:  PROTONIX Take 1 tablet (40 mg total) by mouth daily.   sertraline 100 MG tablet Commonly known as:  ZOLOFT Take 1 tablet (100 mg total) by mouth daily.   SUMAtriptan 50 MG tablet Commonly known as:  IMITREX TAKE 1 OR 2 TABLETS AT ONSET OF MIGRAINE. MAY REPEAT IN 2 HOURS IF NEEDED   temazepam 7.5 MG capsule Commonly  known as:  RESTORIL Take 1 capsule (7.5 mg total) by mouth at bedtime as needed for sleep.   traMADol 50 MG tablet Commonly known as:  ULTRAM Take 2 tablets (100 mg total) by mouth 4 (four) times daily. TAKE (2) TABLETS EVERY SIX HOURS AS NEEDED.          Objective:    BP 123/78    Pulse (!) 110    Temp (!) 97 F (36.1 C) (Oral)    Ht 5\' 5"  (1.651 m)    Wt 157 lb 3.2 oz (71.3 kg)    BMI 26.16 kg/m   Allergies  Allergen Reactions   Sulfa Antibiotics Nausea And Vomiting   Vioxx [Rofecoxib]     Wt Readings from Last 3 Encounters:  12/18/17 157 lb 3.2 oz (71.3 kg)  09/17/17 156 lb 3.2 oz (70.9 kg)  06/10/17 154 lb (69.9 kg)    Physical Exam  Constitutional: She is oriented to person, place, and time. She appears well-developed and well-nourished.  HENT:  Head: Normocephalic and atraumatic.  Right Ear: Tympanic membrane, external ear and ear canal normal.  Left Ear: Tympanic membrane, external ear and ear canal normal.  Nose: Nose normal. No rhinorrhea.  Mouth/Throat: Oropharynx is clear and moist and mucous membranes are normal. No oropharyngeal exudate or posterior oropharyngeal erythema.  Eyes: Pupils are equal, round, and reactive to light. Conjunctivae and EOM are normal. Left eye exhibits hordeolum.    Neck: Normal range of motion. Neck supple.  Cardiovascular: Normal rate, regular rhythm, normal heart sounds and intact distal pulses.  Pulmonary/Chest: Effort normal and breath sounds normal.  Abdominal: Soft. Bowel sounds are normal.  Neurological: She is alert and oriented to person, place, and time. She has normal reflexes.  Skin: Skin is warm and dry. No rash noted.  Psychiatric: She has a normal mood and affect. Her behavior is normal. Judgment and thought content normal.        Assessment & Plan:   1. Arthralgia of both lower legs - traMADol (ULTRAM) 50 MG tablet; Take 2 tablets (100 mg total) by mouth 4 (four) times daily. TAKE (2) TABLETS EVERY SIX HOURS  AS NEEDED.  Dispense: 60 tablet; Refill: 5  2. Attention deficit hyperactivity disorder (ADHD), predominantly inattentive type - amphetamine-dextroamphetamine (ADDERALL XR) 10 MG 24 hr capsule; Take 1 capsule (10 mg total) by mouth daily.  Dispense: 30 capsule; Refill: 0 - amphetamine-dextroamphetamine (ADDERALL XR) 10 MG 24 hr capsule; Take 1 capsule (10 mg total) by mouth daily.  Dispense: 30 capsule; Refill: 0 - amphetamine-dextroamphetamine (ADDERALL XR) 10 MG 24 hr capsule; Take 1 capsule (10 mg total) by mouth daily.  Dispense: 30 capsule; Refill: 0  3. Acquired hypothyroidism - TSH  4. Need for immunization against influenza - Flu  Vaccine QUAD 36+ mos IM  5. Hordeolum externum of left lower eyelid - erythromycin ophthalmic ointment; Place 1 application into the left eye 3 (three) times daily.  Dispense: 3.5 g; Refill: 0  6. Insomnia due to other mental disorder - temazepam (RESTORIL) 7.5 MG capsule; Take 1 capsule (7.5 mg total) by mouth at bedtime as needed for sleep.  Dispense: 30 capsule; Refill: 5   Continue all other maintenance medications as listed above.  Follow up plan: Return in about 3 months (around 03/19/2018) for recheck and lab.  Educational handout given for [place]  Remus Loffler PA-C [facility] [address]  [place], [place] [address] [phone]   12/18/2017, 1:42 PM

## 2017-12-19 LAB — TSH: TSH: 5.83 u[IU]/mL — AB (ref 0.450–4.500)

## 2017-12-20 ENCOUNTER — Other Ambulatory Visit: Payer: Self-pay | Admitting: Physician Assistant

## 2017-12-20 MED ORDER — LEVOTHYROXINE SODIUM 125 MCG PO TABS: 125.0000 ug | ORAL_TABLET | Freq: Every day | ORAL | 5 refills | Status: DC

## 2018-01-03 ENCOUNTER — Other Ambulatory Visit: Payer: Self-pay | Admitting: Physician Assistant

## 2018-01-03 DIAGNOSIS — G43809 Other migraine, not intractable, without status migrainosus: Secondary | ICD-10-CM

## 2018-02-04 ENCOUNTER — Other Ambulatory Visit: Payer: Self-pay | Admitting: Family

## 2018-02-04 DIAGNOSIS — R42 Dizziness and giddiness: Secondary | ICD-10-CM

## 2018-03-05 ENCOUNTER — Other Ambulatory Visit: Payer: Self-pay | Admitting: Physician Assistant

## 2018-03-21 ENCOUNTER — Other Ambulatory Visit: Payer: Self-pay

## 2018-03-21 ENCOUNTER — Ambulatory Visit: Payer: 59 | Admitting: Physician Assistant

## 2018-03-21 ENCOUNTER — Encounter: Payer: Self-pay | Admitting: Physician Assistant

## 2018-03-21 VITALS — BP 139/84 | HR 110 | Ht 65.0 in | Wt 165.0 lb

## 2018-03-21 DIAGNOSIS — E039 Hypothyroidism, unspecified: Secondary | ICD-10-CM | POA: Diagnosis not present

## 2018-03-21 DIAGNOSIS — F411 Generalized anxiety disorder: Secondary | ICD-10-CM | POA: Diagnosis not present

## 2018-03-21 DIAGNOSIS — M25561 Pain in right knee: Secondary | ICD-10-CM | POA: Diagnosis not present

## 2018-03-21 DIAGNOSIS — M25562 Pain in left knee: Secondary | ICD-10-CM

## 2018-03-21 DIAGNOSIS — F331 Major depressive disorder, recurrent, moderate: Secondary | ICD-10-CM

## 2018-03-21 MED ORDER — PENICILLIN V POTASSIUM 500 MG PO TABS: 500.0000 mg | ORAL_TABLET | Freq: Four times a day (QID) | ORAL | 1 refills | Status: DC

## 2018-03-21 MED ORDER — TRAMADOL HCL 50 MG PO TABS: 100.0000 mg | ORAL_TABLET | Freq: Four times a day (QID) | ORAL | 5 refills | Status: DC

## 2018-03-21 MED ORDER — SERTRALINE HCL 100 MG PO TABS: 200.0000 mg | ORAL_TABLET | Freq: Every day | ORAL | 11 refills | Status: DC

## 2018-03-21 MED ORDER — ALPRAZOLAM 1 MG PO TABS: 1.0000 mg | ORAL_TABLET | Freq: Three times a day (TID) | ORAL | 5 refills | Status: DC | PRN

## 2018-03-21 MED ORDER — GABAPENTIN 100 MG PO CAPS: 100.0000 mg | ORAL_CAPSULE | Freq: Every day | ORAL | 3 refills | Status: DC

## 2018-03-21 MED ORDER — PANTOPRAZOLE SODIUM 40 MG PO TBEC: 40.0000 mg | DELAYED_RELEASE_TABLET | Freq: Every day | ORAL | 11 refills | Status: DC

## 2018-03-21 NOTE — Progress Notes (Signed)
BP 139/84   Pulse (!) 110   Ht 5\' 5"  (1.651 m)   Wt 165 lb (74.8 kg)   BMI 27.46 kg/m    Subjective:    Patient ID: Phyllis Parker, female    DOB: 1960/04/07, 58 y.o.   MRN: [id number]  HPI: Phyllis Parker is a 58 y.o. female presenting on 03/21/2018 for Hypothyroidism (3 month) and ADHD  This patient comes in for 26-month checkup on her chronic medical conditions.  She does have severe depression and generalized anxiety.  We are trying to work around her medications to have improved anxiety and also to reduce pain in her legs.  This is both due to some arthritis changes and an old trauma last year of a broken ankle.  She does need lab work performed.  ANXIETY ASSESSMENT Cause of anxiety: Generalized anxiety disorder This patient returns for a  month recheck on narcotic use for the above named condition(s)  Current medications-alprazolam Other medications tried: BuSpar, Zoloft, Wellbutrin Medication side effects-none Any concerns-ongoing depression Any change in general medical condition-none Effectiveness of current meds-good PMP AWARE website reviewed: Yes Any suspicious activity on PMP Aware: No LME daily dose: 6  Contract on file Last UDS  03/21/2018  History of overdose or risk of abuse no  Depression screen [facility] 2/9 03/21/2018 12/18/2017 09/17/2017 06/10/2017 04/25/2017  Decreased Interest 1 1 1 1 2   Down, Depressed, Hopeless 1 1 1 1 2   PHQ - 2 Score 2 2 2 2 4   Altered sleeping 1 1 1 1 2   Tired, decreased energy 1 1 1 1 2   Change in appetite 1 1 1 1 2   Feeling bad or failure about yourself  1 1 1 1 2   Trouble concentrating 1 1 1 1 2   Moving slowly or fidgety/restless 0 0 1 1 2   Suicidal thoughts 0 0 0 0 0  PHQ-9 Score 7 7 8 8 16   Some recent data might be hidden      Past Medical History:  Diagnosis Date  . ADD (attention deficit disorder)   . Anxiety   . Depression    Relevant past medical, surgical, family and social history reviewed and updated as indicated.  Interim medical history since our last visit reviewed. Allergies and medications reviewed and updated. DATA REVIEWED: CHART IN EPIC  Family History reviewed for pertinent findings.  Review of Systems  Constitutional: Negative.   HENT: Negative.   Eyes: Negative.   Respiratory: Negative.   Gastrointestinal: Negative.   Genitourinary: Negative.   Psychiatric/Behavioral: Positive for decreased concentration. Negative for suicidal ideas. The patient is nervous/anxious and is hyperactive.     Allergies as of 03/21/2018      Reactions   Sulfa Antibiotics Nausea And Vomiting   Vioxx [rofecoxib]       Medication List       Accurate as of March 21, 2018 11:59 PM. Always use your most recent med list.        ALPRAZolam 1 MG tablet Commonly known as:  XANAX Take 1 tablet (1 mg total) by mouth 3 (three) times daily as needed for anxiety.   amphetamine-dextroamphetamine 10 MG 24 hr capsule Commonly known as:  Adderall XR Take 1 capsule (10 mg total) by mouth daily.   amphetamine-dextroamphetamine 10 MG 24 hr capsule Commonly known as:  Adderall XR Take 1 capsule (10 mg total) by mouth daily.   amphetamine-dextroamphetamine 10 MG 24 hr capsule Commonly known as:  Adderall XR Take 1 capsule (  10 mg total) by mouth daily.   atorvastatin 10 MG tablet Commonly known as:  LIPITOR Take 1 tablet (10 mg total) by mouth daily.   buPROPion 150 MG 24 hr tablet Commonly known as:  WELLBUTRIN XL Take 150 mg by mouth daily.   dicyclomine 10 MG capsule Commonly known as:  BENTYL TAKE 1 CAPSULE FOUR TIMES DAILY BEFORE MEALS AND AT BEDTIME AS NEEDED   gabapentin 100 MG capsule Commonly known as:  NEURONTIN Take 1-3 capsules (100-300 mg total) by mouth at bedtime.   levothyroxine 125 MCG tablet Commonly known as:  SYNTHROID, LEVOTHROID Take 1 tablet (125 mcg total) by mouth daily.   meclizine 25 MG tablet Commonly known as:  ANTIVERT TAKE (1) TABLET THREE TIMES DAILY AS NEEDED FOR  DIZZINESS.   meloxicam 15 MG tablet Commonly known as:  MOBIC Take 1 tablet (15 mg total) by mouth daily.   pantoprazole 40 MG tablet Commonly known as:  PROTONIX Take 1 tablet (40 mg total) by mouth daily.   penicillin v potassium 500 MG tablet Commonly known as:  VEETID Take 1 tablet (500 mg total) by mouth 4 (four) times daily.   sertraline 100 MG tablet Commonly known as:  ZOLOFT Take 2 tablets (200 mg total) by mouth daily.   SUMAtriptan 50 MG tablet Commonly known as:  IMITREX TAKE 1 OR 2 TABLETS AT ONSET OF MIGRAINE. MAY REPEAT IN 2 HOURS IF NEEDED   traMADol 50 MG tablet Commonly known as:  ULTRAM Take 2 tablets (100 mg total) by mouth 4 (four) times daily. TAKE (2) TABLETS EVERY SIX HOURS AS NEEDED.          Objective:    BP 139/84   Pulse (!) 110   Ht 5\' 5"  (1.651 m)   Wt 165 lb (74.8 kg)   BMI 27.46 kg/m   Allergies  Allergen Reactions  . Sulfa Antibiotics Nausea And Vomiting  . Vioxx [Rofecoxib]     Wt Readings from Last 3 Encounters:  03/21/18 165 lb (74.8 kg)  12/18/17 157 lb 3.2 oz (71.3 kg)  09/17/17 156 lb 3.2 oz (70.9 kg)    Physical Exam Constitutional:      Appearance: She is well-developed.  HENT:     Head: Normocephalic and atraumatic.  Eyes:     Conjunctiva/sclera: Conjunctivae normal.     Pupils: Pupils are equal, round, and reactive to light.  Cardiovascular:     Rate and Rhythm: Normal rate and regular rhythm.     Heart sounds: Normal heart sounds.  Pulmonary:     Effort: Pulmonary effort is normal.     Breath sounds: Normal breath sounds.  Abdominal:     General: Bowel sounds are normal.     Palpations: Abdomen is soft.  Skin:    General: Skin is warm and dry.     Findings: No rash.  Neurological:     Mental Status: She is alert and oriented to person, place, and time.     Deep Tendon Reflexes: Reflexes are normal and symmetric.  Psychiatric:        Behavior: Behavior normal.        Thought Content: Thought content  normal.        Judgment: Judgment normal.     Results for orders placed or performed in visit on 03/21/18  Thyroid Panel With TSH  Result Value Ref Range   TSH 2.370 0.450 - 4.500 uIU/mL   T4, Total 8.0 4.5 - 12.0 ug/dL  T3 Uptake Ratio 26 24 - 39 %   Free Thyroxine Index 2.1 1.2 - 4.9  CMP14+EGFR  Result Value Ref Range   Glucose 82 65 - 99 mg/dL   BUN 19 6 - 24 mg/dL   Creatinine, Ser 5.28 0.57 - 1.00 mg/dL   GFR calc non Af Amer 69 >59 mL/min/1.73   GFR calc Af Amer 80 >59 mL/min/1.73   BUN/Creatinine Ratio 20 9 - 23   Sodium 141 134 - 144 mmol/L   Potassium 4.2 3.5 - 5.2 mmol/L   Chloride 103 96 - 106 mmol/L   CO2 22 20 - 29 mmol/L   Calcium 9.7 8.7 - 10.2 mg/dL   Total Protein 6.8 6.0 - 8.5 g/dL   Albumin 4.7 3.8 - 4.9 g/dL   Globulin, Total 2.1 1.5 - 4.5 g/dL   Albumin/Globulin Ratio 2.2 1.2 - 2.2   Bilirubin Total 0.4 0.0 - 1.2 mg/dL   Alkaline Phosphatase 73 39 - 117 IU/L   AST 23 0 - 40 IU/L   ALT 16 0 - 32 IU/L  CBC with Differential/Platelet  Result Value Ref Range   WBC 4.6 3.4 - 10.8 x10E3/uL   RBC 3.77 3.77 - 5.28 x10E6/uL   Hemoglobin 11.3 11.1 - 15.9 g/dL   Hematocrit 41.3 24.4 - 46.6 %   MCV 91 79 - 97 fL   MCH 30.0 26.6 - 33.0 pg   MCHC 32.8 31.5 - 35.7 g/dL   RDW 01.0 27.2 - 53.6 %   Platelets 323 150 - 450 x10E3/uL   Neutrophils 48 Not Estab. %   Lymphs 33 Not Estab. %   Monocytes 7 Not Estab. %   Eos 11 Not Estab. %   Basos 1 Not Estab. %   Neutrophils Absolute 2.2 1.4 - 7.0 x10E3/uL   Lymphocytes Absolute 1.5 0.7 - 3.1 x10E3/uL   Monocytes Absolute 0.3 0.1 - 0.9 x10E3/uL   EOS (ABSOLUTE) 0.5 (H) 0.0 - 0.4 x10E3/uL   Basophils Absolute 0.1 0.0 - 0.2 x10E3/uL   Immature Granulocytes 0 Not Estab. %   Immature Grans (Abs) 0.0 0.0 - 0.1 x10E3/uL  Lipid panel  Result Value Ref Range   Cholesterol, Total 233 (H) 100 - 199 mg/dL   Triglycerides 70 0 - 149 mg/dL   HDL 90 >64 mg/dL   VLDL Cholesterol Cal 14 5 - 40 mg/dL   LDL Calculated 403  (H) 0 - 99 mg/dL   Chol/HDL Ratio 2.6 0.0 - 4.4 ratio      Assessment & Plan:   1. Moderate episode of recurrent major depressive disorder (HCC) - sertraline (ZOLOFT) 100 MG tablet; Take 2 tablets (200 mg total) by mouth daily.  Dispense: 60 tablet; Refill: 11  2. Arthralgia of both lower legs - gabapentin (NEURONTIN) 100 MG capsule; Take 1-3 capsules (100-300 mg total) by mouth at bedtime.  Dispense: 90 capsule; Refill: 3 - traMADol (ULTRAM) 50 MG tablet; Take 2 tablets (100 mg total) by mouth 4 (four) times daily. TAKE (2) TABLETS EVERY SIX HOURS AS NEEDED.  Dispense: 90 tablet; Refill: 5  3. GAD (generalized anxiety disorder) - ToxASSURE Select 13 (MW), Urine - ALPRAZolam (XANAX) 1 MG tablet; Take 1 tablet (1 mg total) by mouth 3 (three) times daily as needed for anxiety.  Dispense: 90 tablet; Refill: 5  4. Acquired hypothyroidism - Thyroid Panel With TSH - CMP14+EGFR - CBC with Differential/Platelet - Lipid panel - Lipid panel   Continue all other maintenance medications as listed above.  Follow  up plan: Return in about 3 months (around 06/21/2018).  Educational handout given for survey  Remus Loffler PA-C [facility] [address]  [place], [place] [address] [phone]   03/24/2018, 10:37 AM

## 2018-03-22 LAB — CBC WITH DIFFERENTIAL/PLATELET
Basophils Absolute: 0.1 10*3/uL (ref 0.0–0.2)
Basos: 1 %
EOS (ABSOLUTE): 0.5 10*3/uL — ABNORMAL HIGH (ref 0.0–0.4)
Eos: 11 %
Hematocrit: 34.4 % (ref 34.0–46.6)
Hemoglobin: 11.3 g/dL (ref 11.1–15.9)
Immature Grans (Abs): 0 10*3/uL (ref 0.0–0.1)
Immature Granulocytes: 0 %
Lymphocytes Absolute: 1.5 10*3/uL (ref 0.7–3.1)
Lymphs: 33 %
MCH: 30 pg (ref 26.6–33.0)
MCHC: 32.8 g/dL (ref 31.5–35.7)
MCV: 91 fL (ref 79–97)
Monocytes Absolute: 0.3 10*3/uL (ref 0.1–0.9)
Monocytes: 7 %
NEUTROS ABS: 2.2 10*3/uL (ref 1.4–7.0)
Neutrophils: 48 %
Platelets: 323 10*3/uL (ref 150–450)
RBC: 3.77 x10E6/uL (ref 3.77–5.28)
RDW: 11.8 % (ref 11.7–15.4)
WBC: 4.6 10*3/uL (ref 3.4–10.8)

## 2018-03-22 LAB — CMP14+EGFR
ALT: 16 IU/L (ref 0–32)
AST: 23 IU/L (ref 0–40)
Albumin/Globulin Ratio: 2.2 (ref 1.2–2.2)
Albumin: 4.7 g/dL (ref 3.8–4.9)
Alkaline Phosphatase: 73 IU/L (ref 39–117)
BILIRUBIN TOTAL: 0.4 mg/dL (ref 0.0–1.2)
BUN / CREAT RATIO: 20 (ref 9–23)
BUN: 19 mg/dL (ref 6–24)
CHLORIDE: 103 mmol/L (ref 96–106)
CO2: 22 mmol/L (ref 20–29)
Calcium: 9.7 mg/dL (ref 8.7–10.2)
Creatinine, Ser: 0.93 mg/dL (ref 0.57–1.00)
GFR calc Af Amer: 80 mL/min/{1.73_m2} (ref >59)
GFR calc non Af Amer: 69 mL/min/{1.73_m2} (ref >59)
Globulin, Total: 2.1 g/dL (ref 1.5–4.5)
Glucose: 82 mg/dL (ref 65–99)
Potassium: 4.2 mmol/L (ref 3.5–5.2)
Sodium: 141 mmol/L (ref 134–144)
Total Protein: 6.8 g/dL (ref 6.0–8.5)

## 2018-03-22 LAB — LIPID PANEL
Chol/HDL Ratio: 2.6 ratio (ref 0.0–4.4)
Cholesterol, Total: 233 mg/dL — ABNORMAL HIGH (ref 100–199)
HDL: 90 mg/dL (ref >39)
LDL Calculated: 129 mg/dL — ABNORMAL HIGH (ref 0–99)
Triglycerides: 70 mg/dL (ref 0–149)
VLDL Cholesterol Cal: 14 mg/dL (ref 5–40)

## 2018-03-22 LAB — THYROID PANEL WITH TSH
Free Thyroxine Index: 2.1 (ref 1.2–4.9)
T3 Uptake Ratio: 26 % (ref 24–39)
T4, Total: 8 ug/dL (ref 4.5–12.0)
TSH: 2.37 u[IU]/mL (ref 0.450–4.500)

## 2018-03-28 LAB — TOXASSURE SELECT 13 (MW), URINE

## 2018-05-01 ENCOUNTER — Other Ambulatory Visit: Payer: Self-pay

## 2018-05-01 ENCOUNTER — Ambulatory Visit (INDEPENDENT_AMBULATORY_CARE_PROVIDER_SITE_OTHER): Payer: 59 | Admitting: Physician Assistant

## 2018-05-01 DIAGNOSIS — J209 Acute bronchitis, unspecified: Secondary | ICD-10-CM

## 2018-05-01 MED ORDER — ALBUTEROL SULFATE HFA 108 (90 BASE) MCG/ACT IN AERS: 2.0000 | INHALATION_SPRAY | Freq: Four times a day (QID) | RESPIRATORY_TRACT | 0 refills | Status: DC | PRN

## 2018-05-05 ENCOUNTER — Other Ambulatory Visit: Payer: Self-pay | Admitting: Physician Assistant

## 2018-05-05 ENCOUNTER — Encounter: Payer: Self-pay | Admitting: Physician Assistant

## 2018-05-05 DIAGNOSIS — J209 Acute bronchitis, unspecified: Secondary | ICD-10-CM | POA: Insufficient documentation

## 2018-05-05 DIAGNOSIS — F331 Major depressive disorder, recurrent, moderate: Secondary | ICD-10-CM

## 2018-05-05 NOTE — Progress Notes (Signed)
Telephone visit  Subjective: CC: Cough and URI PCP: Remus Loffler, PA-C Phyllis Parker is a 58 y.o. female calls for telephone consult today. Patient provides verbal consent for consult held via phone.  Patient is identified with 2 separate identifiers.  At this time the entire area is on COVID-19 social distancing and stay home orders are in place.  Patient is of higher risk and therefore we are performing this by a virtual method.  Location of patient: Home Location of provider: [facility] Others present for call: No  This patient has had many days of sore throat and postnasal drainage, headache at times and sinus pressure. There is copious drainage at times. Denies any fever at this time. There has been a history of sinus infections in the past.  There is cough at night. It has become more prevalent in recent days. She is even having a little bit more wheezing and coughing.  She has used an inhaler in the past and would like to get 1.   ROS: Per HPI  Allergies  Allergen Reactions   Sulfa Antibiotics Nausea And Vomiting   Vioxx [Rofecoxib]    Past Medical History:  Diagnosis Date   ADD (attention deficit disorder)    Anxiety    Depression     Current Outpatient Medications:    albuterol (VENTOLIN HFA) 108 (90 Base) MCG/ACT inhaler, Inhale 2 puffs into the lungs every 6 (six) hours as needed for wheezing or shortness of breath., Disp: 1 Inhaler, Rfl: 0   ALPRAZolam (XANAX) 1 MG tablet, Take 1 tablet (1 mg total) by mouth 3 (three) times daily as needed for anxiety., Disp: 90 tablet, Rfl: 5   amphetamine-dextroamphetamine (ADDERALL XR) 10 MG 24 hr capsule, Take 1 capsule (10 mg total) by mouth daily., Disp: 30 capsule, Rfl: 0   amphetamine-dextroamphetamine (ADDERALL XR) 10 MG 24 hr capsule, Take 1 capsule (10 mg total) by mouth daily., Disp: 30 capsule, Rfl: 0   amphetamine-dextroamphetamine (ADDERALL XR) 10 MG 24 hr capsule, Take 1 capsule (10 mg total) by mouth  daily., Disp: 30 capsule, Rfl: 0   atorvastatin (LIPITOR) 10 MG tablet, Take 1 tablet (10 mg total) by mouth daily., Disp: 90 tablet, Rfl: 3   buPROPion (WELLBUTRIN XL) 150 MG 24 hr tablet, Take 150 mg by mouth daily., Disp: , Rfl:    dicyclomine (BENTYL) 10 MG capsule, TAKE 1 CAPSULE FOUR TIMES DAILY BEFORE MEALS AND AT BEDTIME AS NEEDED, Disp: 90 capsule, Rfl: 0   gabapentin (NEURONTIN) 100 MG capsule, Take 1-3 capsules (100-300 mg total) by mouth at bedtime., Disp: 90 capsule, Rfl: 3   levothyroxine (SYNTHROID, LEVOTHROID) 125 MCG tablet, Take 1 tablet (125 mcg total) by mouth daily., Disp: 30 tablet, Rfl: 5   meclizine (ANTIVERT) 25 MG tablet, TAKE (1) TABLET THREE TIMES DAILY AS NEEDED FOR DIZZINESS., Disp: 30 tablet, Rfl: 4   meloxicam (MOBIC) 15 MG tablet, Take 1 tablet (15 mg total) by mouth daily., Disp: 30 tablet, Rfl: 11   pantoprazole (PROTONIX) 40 MG tablet, Take 1 tablet (40 mg total) by mouth daily., Disp: 30 tablet, Rfl: 11   sertraline (ZOLOFT) 100 MG tablet, Take 2 tablets (200 mg total) by mouth daily., Disp: 60 tablet, Rfl: 11   SUMAtriptan (IMITREX) 50 MG tablet, TAKE 1 OR 2 TABLETS AT ONSET OF MIGRAINE. MAY REPEAT IN 2 HOURS IF NEEDED, Disp: 9 tablet, Rfl: 2   traMADol (ULTRAM) 50 MG tablet, Take 2 tablets (100 mg total) by mouth 4 (  four) times daily. TAKE (2) TABLETS EVERY SIX HOURS AS NEEDED., Disp: 90 tablet, Rfl: 5  Assessment/ Plan: 58 y.o. female   1. Acute bronchitis, unspecified organism - albuterol (VENTOLIN HFA) 108 (90 Base) MCG/ACT inhaler; Inhale 2 puffs into the lungs every 6 (six) hours as needed for wheezing or shortness of breath.  Dispense: 1 Inhaler; Refill: 0  Start time: 4:25 PM End time: 4:33 PM  Meds ordered this encounter  Medications   albuterol (VENTOLIN HFA) 108 (90 Base) MCG/ACT inhaler    Sig: Inhale 2 puffs into the lungs every 6 (six) hours as needed for wheezing or shortness of breath.    Dispense:  1 Inhaler    Refill:  0     Order Specific Question:   Supervising Provider    Answer:   Raliegh IpGOTTSCHALK, ASHLY M [[id number]]    Prudy FeelerAngel Jaquasha Carnevale PA-C [facility] [phone]

## 2018-05-19 ENCOUNTER — Telehealth: Payer: Self-pay | Admitting: Physician Assistant

## 2018-06-20 ENCOUNTER — Telehealth: Payer: Self-pay | Admitting: Physician Assistant

## 2018-06-23 ENCOUNTER — Encounter: Payer: Self-pay | Admitting: Physician Assistant

## 2018-06-23 ENCOUNTER — Other Ambulatory Visit: Payer: Self-pay

## 2018-06-23 ENCOUNTER — Other Ambulatory Visit: Payer: Self-pay | Admitting: Physician Assistant

## 2018-06-23 ENCOUNTER — Ambulatory Visit (INDEPENDENT_AMBULATORY_CARE_PROVIDER_SITE_OTHER): Payer: 59 | Admitting: Physician Assistant

## 2018-06-23 DIAGNOSIS — R062 Wheezing: Secondary | ICD-10-CM | POA: Diagnosis not present

## 2018-06-23 DIAGNOSIS — F900 Attention-deficit hyperactivity disorder, predominantly inattentive type: Secondary | ICD-10-CM | POA: Diagnosis not present

## 2018-06-23 DIAGNOSIS — K219 Gastro-esophageal reflux disease without esophagitis: Secondary | ICD-10-CM

## 2018-06-23 MED ORDER — ESOMEPRAZOLE MAGNESIUM 40 MG PO CPDR: 40.0000 mg | DELAYED_RELEASE_CAPSULE | Freq: Every day | ORAL | 11 refills | Status: DC

## 2018-06-23 MED ORDER — AMPHETAMINE-DEXTROAMPHET ER 10 MG PO CP24: 10.0000 mg | ORAL_CAPSULE | Freq: Every day | ORAL | 0 refills | Status: DC

## 2018-06-23 MED ORDER — MONTELUKAST SODIUM 10 MG PO TABS: 10.0000 mg | ORAL_TABLET | Freq: Every day | ORAL | 11 refills | Status: DC

## 2018-06-23 NOTE — Progress Notes (Signed)
Telephone visit  Subjective: ZO:XWRUEAVCC:rechekc on cough, wheeze PCP: Remus LofflerJones, Schylar Wuebker S, PA-C WUJ:[id number]:Phyllis Parker is a 58 y.o. female calls for telephone consult today. Patient provides verbal consent for consult held via phone.  Patient is identified with 2 separate identifiers.  At this time the entire area is on COVID-19 social distancing and stay home orders are in place.  Patient is of higher risk and therefore we are performing this by a virtual method.  Location of patient: home Location of provider: HOME Others present for call: no  Patient is for recheck on the issues that she is having with coughing and wheezing.  She has never had a diagnosis of asthma in the past.  She states that her allergies have been pretty severe.  She has been using the albuterol if needed.  But she does feel like it is even more than just as needed.  She does have GERD but is not well controlled right now and I explained that this can make asthma symptoms were so she will stop her pantoprazole and switch over to Nexium.  In the past she had taken Prilosec.  She is not having significant nighttime symptoms.  She has never smoked.  She did work in a [employer] for about 15years when she was young.  Also discussed adding Singulair as a way to help control the cough and wheeze.  She would also like to restart her Adderall.  It is been many months since she has been able to take it.  She has some financial constraints.  But now she is able to get it and would like to restart it.  She knows that this will help her throughout the day and her ability to focus on her job.   ROS: Per HPI  Allergies  Allergen Reactions   Sulfa Antibiotics Nausea And Vomiting   Vioxx [Rofecoxib]    Past Medical History:  Diagnosis Date   ADD (attention deficit disorder)    Anxiety    Depression     Current Outpatient Medications:    albuterol (VENTOLIN HFA) 108 (90 Base) MCG/ACT inhaler, Inhale 2 puffs into the lungs every 6  (six) hours as needed for wheezing or shortness of breath., Disp: 1 Inhaler, Rfl: 0   ALPRAZolam (XANAX) 1 MG tablet, Take 1 tablet (1 mg total) by mouth 3 (three) times daily as needed for anxiety., Disp: 90 tablet, Rfl: 5   amphetamine-dextroamphetamine (ADDERALL XR) 10 MG 24 hr capsule, Take 1 capsule (10 mg total) by mouth daily., Disp: 30 capsule, Rfl: 0   amphetamine-dextroamphetamine (ADDERALL XR) 10 MG 24 hr capsule, Take 1 capsule (10 mg total) by mouth daily., Disp: 30 capsule, Rfl: 0   amphetamine-dextroamphetamine (ADDERALL XR) 10 MG 24 hr capsule, Take 1 capsule (10 mg total) by mouth daily., Disp: 30 capsule, Rfl: 0   atorvastatin (LIPITOR) 10 MG tablet, Take 1 tablet (10 mg total) by mouth daily., Disp: 90 tablet, Rfl: 3   buPROPion (WELLBUTRIN XL) 150 MG 24 hr tablet, TAKE (1) TABLET DAILY IN THE MORNING., Disp: 30 tablet, Rfl: 5   dicyclomine (BENTYL) 10 MG capsule, TAKE 1 CAPSULE FOUR TIMES DAILY BEFORE MEALS AND AT BEDTIME AS NEEDED, Disp: 90 capsule, Rfl: 0   esomeprazole (NEXIUM) 40 MG capsule, Take 1 capsule (40 mg total) by mouth daily at 12 noon., Disp: 30 capsule, Rfl: 11   gabapentin (NEURONTIN) 100 MG capsule, Take 1-3 capsules (100-300 mg total) by mouth at bedtime., Disp: 90 capsule, Rfl:  3   levothyroxine (SYNTHROID, LEVOTHROID) 125 MCG tablet, Take 1 tablet (125 mcg total) by mouth daily., Disp: 30 tablet, Rfl: 5   meclizine (ANTIVERT) 25 MG tablet, TAKE (1) TABLET THREE TIMES DAILY AS NEEDED FOR DIZZINESS., Disp: 30 tablet, Rfl: 4   meloxicam (MOBIC) 15 MG tablet, Take 1 tablet (15 mg total) by mouth daily., Disp: 30 tablet, Rfl: 11   montelukast (SINGULAIR) 10 MG tablet, Take 1 tablet (10 mg total) by mouth at bedtime., Disp: 30 tablet, Rfl: 11   sertraline (ZOLOFT) 100 MG tablet, Take 2 tablets (200 mg total) by mouth daily., Disp: 60 tablet, Rfl: 11   SUMAtriptan (IMITREX) 50 MG tablet, TAKE 1 OR 2 TABLETS AT ONSET OF MIGRAINE. MAY REPEAT IN 2 HOURS IF  NEEDED, Disp: 9 tablet, Rfl: 2   traMADol (ULTRAM) 50 MG tablet, Take 2 tablets (100 mg total) by mouth 4 (four) times daily. TAKE (2) TABLETS EVERY SIX HOURS AS NEEDED., Disp: 90 tablet, Rfl: 5  Assessment/ Plan: 58 y.o. female   1. Attention deficit hyperactivity disorder (ADHD), predominantly inattentive type - amphetamine-dextroamphetamine (ADDERALL XR) 10 MG 24 hr capsule; Take 1 capsule (10 mg total) by mouth daily.  Dispense: 30 capsule; Refill: 0 - amphetamine-dextroamphetamine (ADDERALL XR) 10 MG 24 hr capsule; Take 1 capsule (10 mg total) by mouth daily.  Dispense: 30 capsule; Refill: 0 - amphetamine-dextroamphetamine (ADDERALL XR) 10 MG 24 hr capsule; Take 1 capsule (10 mg total) by mouth daily.  Dispense: 30 capsule; Refill: 0  2. Gastroesophageal reflux disease without esophagitis - esomeprazole (NEXIUM) 40 MG capsule; Take 1 capsule (40 mg total) by mouth daily at 12 noon.  Dispense: 30 capsule; Refill: 11  3. Wheeze - montelukast (SINGULAIR) 10 MG tablet; Take 1 tablet (10 mg total) by mouth at bedtime.  Dispense: 30 tablet; Refill: 11   Continue all other maintenance medications as listed above.  Start time: 7:56 AM End time: 8:14 AM  Meds ordered this encounter  Medications   montelukast (SINGULAIR) 10 MG tablet    Sig: Take 1 tablet (10 mg total) by mouth at bedtime.    Dispense:  30 tablet    Refill:  11    Order Specific Question:   Supervising Provider    Answer:   Raliegh IpGOTTSCHALK, ASHLY M [[id number]]   esomeprazole (NEXIUM) 40 MG capsule    Sig: Take 1 capsule (40 mg total) by mouth daily at 12 noon.    Dispense:  30 capsule    Refill:  11    Order Specific Question:   Supervising Provider    Answer:   Raliegh IpGOTTSCHALK, ASHLY M [[id number]]   amphetamine-dextroamphetamine (ADDERALL XR) 10 MG 24 hr capsule    Sig: Take 1 capsule (10 mg total) by mouth daily.    Dispense:  30 capsule    Refill:  0    Fill 60 days from original script date    Order Specific Question:    Supervising Provider    Answer:   Raliegh IpGOTTSCHALK, ASHLY M [[id number]]   amphetamine-dextroamphetamine (ADDERALL XR) 10 MG 24 hr capsule    Sig: Take 1 capsule (10 mg total) by mouth daily.    Dispense:  30 capsule    Refill:  0    Fill 30 days from original script date    Order Specific Question:   Supervising Provider    Answer:   Raliegh IpGOTTSCHALK, ASHLY M [[id number]]   amphetamine-dextroamphetamine (ADDERALL XR) 10 MG 24 hr capsule    Sig: Take 1  capsule (10 mg total) by mouth daily.    Dispense:  30 capsule    Refill:  0    Order Specific Question:   Supervising Provider    Answer:   Janora Norlander [[id number]]    Particia Nearing PA-C [facility] [phone]

## 2018-06-24 ENCOUNTER — Other Ambulatory Visit: Payer: Self-pay | Admitting: Physician Assistant

## 2018-06-24 DIAGNOSIS — J209 Acute bronchitis, unspecified: Secondary | ICD-10-CM

## 2018-07-07 ENCOUNTER — Other Ambulatory Visit: Payer: Self-pay | Admitting: Physician Assistant

## 2018-07-07 DIAGNOSIS — R42 Dizziness and giddiness: Secondary | ICD-10-CM

## 2018-07-23 ENCOUNTER — Other Ambulatory Visit: Payer: Self-pay | Admitting: Physician Assistant

## 2018-07-29 ENCOUNTER — Other Ambulatory Visit: Payer: Self-pay

## 2018-07-30 ENCOUNTER — Encounter: Payer: Self-pay | Admitting: Physician Assistant

## 2018-07-30 ENCOUNTER — Ambulatory Visit: Payer: 59 | Admitting: Physician Assistant

## 2018-07-30 VITALS — BP 126/80 | HR 111 | Temp 97.2°F | Ht 65.0 in | Wt 171.2 lb

## 2018-07-30 DIAGNOSIS — F4322 Adjustment disorder with anxiety: Secondary | ICD-10-CM

## 2018-07-30 DIAGNOSIS — E039 Hypothyroidism, unspecified: Secondary | ICD-10-CM

## 2018-07-30 DIAGNOSIS — K219 Gastro-esophageal reflux disease without esophagitis: Secondary | ICD-10-CM

## 2018-07-30 DIAGNOSIS — R062 Wheezing: Secondary | ICD-10-CM | POA: Diagnosis not present

## 2018-07-30 DIAGNOSIS — F5101 Primary insomnia: Secondary | ICD-10-CM

## 2018-07-30 MED ORDER — ZOLPIDEM TARTRATE 10 MG PO TABS: 10.0000 mg | ORAL_TABLET | Freq: Every evening | ORAL | 1 refills | Status: DC | PRN

## 2018-07-30 MED ORDER — BUDESONIDE-FORMOTEROL FUMARATE 80-4.5 MCG/ACT IN AERO: 2.0000 | INHALATION_SPRAY | Freq: Two times a day (BID) | RESPIRATORY_TRACT | 3 refills | Status: DC

## 2018-07-30 MED ORDER — LEVOTHYROXINE SODIUM 125 MCG PO TABS: 125.0000 ug | ORAL_TABLET | Freq: Every day | ORAL | 11 refills | Status: DC

## 2018-07-30 MED ORDER — ESCITALOPRAM OXALATE 20 MG PO TABS: 20.0000 mg | ORAL_TABLET | Freq: Every day | ORAL | 2 refills | Status: DC

## 2018-07-30 NOTE — Patient Instructions (Addendum)
Zoloft 100 mg take 1 tablet a day for 5 days, then 1/2 tablet a day for 5 days then every other day for 5 days.     Asthma, Adult  Asthma is a long-term (chronic) condition in which the airways get tight and narrow. The airways are the breathing passages that lead from the nose and mouth down into the lungs. A person with asthma will have times when symptoms get worse. These are called asthma attacks. They can cause coughing, whistling sounds when you breathe (wheezing), shortness of breath, and chest pain. They can make it hard to breathe. There is no cure for asthma, but medicines and lifestyle changes can help control it. There are many things that can bring on an asthma attack or make asthma symptoms worse (triggers). Common triggers include:  Mold.  Dust.  Cigarette smoke.  Cockroaches.  Things that can cause allergy symptoms (allergens). These include animal skin flakes (dander) and pollen from trees or grass.  Things that pollute the air. These may include household cleaners, wood smoke, smog, or chemical odors.  Cold air, weather changes, and wind.  Crying or laughing hard.  Stress.  Certain medicines or drugs.  Certain foods such as dried fruit, potato chips, and grape juice.  Infections, such as a cold or the flu.  Certain medical conditions or diseases.  Exercise or tiring activities. Asthma may be treated with medicines and by staying away from the things that cause asthma attacks. Types of medicines may include:  Controller medicines. These help prevent asthma symptoms. They are usually taken every day.  Fast-acting reliever or rescue medicines. These quickly relieve asthma symptoms. They are used as needed and provide short-term relief.  Allergy medicines if your attacks are brought on by allergens.  Medicines to help control the body's defense (immune) system. Follow these instructions at home: Avoiding triggers in your home  Change your heating and air  conditioning filter often.  Limit your use of fireplaces and wood stoves.  Get rid of pests (such as roaches and mice) and their droppings.  Throw away plants if you see mold on them.  Clean your floors. Dust regularly. Use cleaning products that do not smell.  Have someone vacuum when you are not home. Use a vacuum cleaner with a HEPA filter if possible.  Replace carpet with wood, tile, or vinyl flooring. Carpet can trap animal skin flakes and dust.  Use allergy-proof pillows, mattress covers, and box spring covers.  Wash bed sheets and blankets every week in hot water. Dry them in a dryer.  Keep your bedroom free of any triggers.  Avoid pets and keep windows closed when things that cause allergy symptoms are in the air.  Use blankets that are made of polyester or cotton.  Clean bathrooms and kitchens with bleach. If possible, have someone repaint the walls in these rooms with mold-resistant paint. Keep out of the rooms that are being cleaned and painted.  Wash your hands often with soap and water. If soap and water are not available, use hand sanitizer.  Do not allow anyone to smoke in your home. General instructions  Take over-the-counter and prescription medicines only as told by your doctor. ? Talk with your doctor if you have questions about how or when to take your medicines. ? Make note if you need to use your medicines more often than usual.  Do not use any products that contain nicotine or tobacco, such as cigarettes and e-cigarettes. If you need help quitting,  ask your doctor.  Stay away from secondhand smoke.  Avoid doing things outdoors when allergen counts are high and when air quality is low.  Wear a ski mask when doing outdoor activities in the winter. The mask should cover your nose and mouth. Exercise indoors on cold days if you can.  Warm up before you exercise. Take time to cool down after exercise.  Use a peak flow meter as told by your doctor. A peak  flow meter is a tool that measures how well the lungs are working.  Keep track of the peak flow meter's readings. Write them down.  Follow your asthma action plan. This is a written plan for taking care of your asthma and treating your attacks.  Make sure you get all the shots (vaccines) that your doctor recommends. Ask your doctor about a flu shot and a pneumonia shot.  Keep all follow-up visits as told by your doctor. This is important. Contact a doctor if:  You have wheezing, shortness of breath, or a cough even while taking medicine to prevent attacks.  The mucus you cough up (sputum) is thicker than usual.  The mucus you cough up changes from clear or white to yellow, green, gray, or bloody.  You have problems from the medicine you are taking, such as: ? A rash. ? Itching. ? Swelling. ? Trouble breathing.  You need reliever medicines more than 2-3 times a week.  Your peak flow reading is still at 50-79% of your personal best after following the action plan for 1 hour.  You have a fever. Get help right away if:  You seem to be worse and are not responding to medicine during an asthma attack.  You are short of breath even at rest.  You get short of breath when doing very little activity.  You have trouble eating, drinking, or talking.  You have chest pain or tightness.  You have a fast heartbeat.  Your lips or fingernails start to turn blue.  You are light-headed or dizzy, or you faint.  Your peak flow is less than 50% of your personal best.  You feel too tired to breathe normally. Summary  Asthma is a long-term (chronic) condition in which the airways get tight and narrow. An asthma attack can make it hard to breathe.  Asthma cannot be cured, but medicines and lifestyle changes can help control it.  Make sure you understand how to avoid triggers and how and when to use your medicines. This information is not intended to replace advice given to you by your  health care provider. Make sure you discuss any questions you have with your health care provider. Document Released: 06/13/2007 Document Revised: 02/27/2018 Document Reviewed: 01/30/2016 Elsevier Patient Education  2020 [employer] Sorry

## 2018-07-31 ENCOUNTER — Telehealth: Payer: Self-pay

## 2018-07-31 LAB — CBC WITH DIFFERENTIAL/PLATELET
Basophils Absolute: 0 10*3/uL (ref 0.0–0.2)
Basos: 1 %
EOS (ABSOLUTE): 0.2 10*3/uL (ref 0.0–0.4)
Eos: 3 %
Hematocrit: 35.8 % (ref 34.0–46.6)
Hemoglobin: 12.2 g/dL (ref 11.1–15.9)
Immature Grans (Abs): 0 10*3/uL (ref 0.0–0.1)
Immature Granulocytes: 0 %
Lymphocytes Absolute: 1.7 10*3/uL (ref 0.7–3.1)
Lymphs: 36 %
MCH: 30.3 pg (ref 26.6–33.0)
MCHC: 34.1 g/dL (ref 31.5–35.7)
MCV: 89 fL (ref 79–97)
Monocytes Absolute: 0.3 10*3/uL (ref 0.1–0.9)
Monocytes: 7 %
Neutrophils Absolute: 2.6 10*3/uL (ref 1.4–7.0)
Neutrophils: 53 %
Platelets: 283 10*3/uL (ref 150–450)
RBC: 4.03 x10E6/uL (ref 3.77–5.28)
RDW: 12.4 % (ref 11.7–15.4)
WBC: 4.9 10*3/uL (ref 3.4–10.8)

## 2018-07-31 LAB — CMP14+EGFR
ALT: 14 IU/L (ref 0–32)
AST: 23 IU/L (ref 0–40)
Albumin/Globulin Ratio: 1.8 (ref 1.2–2.2)
Albumin: 4.6 g/dL (ref 3.8–4.9)
Alkaline Phosphatase: 85 IU/L (ref 39–117)
BUN/Creatinine Ratio: 16 (ref 9–23)
BUN: 13 mg/dL (ref 6–24)
Bilirubin Total: <0.2 mg/dL (ref 0.0–1.2)
CO2: 21 mmol/L (ref 20–29)
Calcium: 10 mg/dL (ref 8.7–10.2)
Chloride: 101 mmol/L (ref 96–106)
Creatinine, Ser: 0.81 mg/dL (ref 0.57–1.00)
GFR calc Af Amer: 94 mL/min/{1.73_m2} (ref >59)
GFR calc non Af Amer: 81 mL/min/{1.73_m2} (ref >59)
Globulin, Total: 2.5 g/dL (ref 1.5–4.5)
Glucose: 70 mg/dL (ref 65–99)
Potassium: 4.3 mmol/L (ref 3.5–5.2)
Sodium: 142 mmol/L (ref 134–144)
Total Protein: 7.1 g/dL (ref 6.0–8.5)

## 2018-07-31 LAB — THYROID PANEL WITH TSH
Free Thyroxine Index: 1.8 (ref 1.2–4.9)
T3 Uptake Ratio: 23 % — ABNORMAL LOW (ref 24–39)
T4, Total: 8 ug/dL (ref 4.5–12.0)
TSH: 3.98 u[IU]/mL (ref 0.450–4.500)

## 2018-07-31 NOTE — Telephone Encounter (Signed)
[facility] - Left Message.

## 2018-08-04 ENCOUNTER — Telehealth: Payer: Self-pay

## 2018-08-04 NOTE — Telephone Encounter (Signed)
2nd attempt - [facility]

## 2018-08-04 NOTE — Progress Notes (Signed)
BP 126/80    Pulse (!) 111    Temp (!) 97.2 F (36.2 C) (Oral)    Ht 5' 5"  (1.651 m)    Wt 171 lb 3.2 oz (77.7 kg)    BMI 28.49 kg/m    Subjective:    Patient ID: Phyllis Parker, female    DOB: [birth year], 58 y.o.   MRN: [id number]  HPI: Phyllis Parker is a 58 y.o. female presenting on 07/30/2018 for Depression  This patient comes in for periodic recheck on her chronic medical conditions which do include GERD, hypothyroidism, depression with anxiety and insomnia.  She does need medication for the continued dyspnea and wheezing.  It is worse on the hot and humid days.  She found that she is having to use the albuterol twice a week or more.  We have talked about using Symbicort as a controller medication and she is willing to try this.  We have also looked at her medications in the past she has tried Prozac, Zoloft, Paxil, Lexapro.  She has all take taken Cymbalta and Effexor.  She did not tolerate this very well.  Depression screen [facility] 2/9 07/30/2018 03/21/2018 12/18/2017 09/17/2017 06/10/2017  Decreased Interest 0 1 1 1 1   Down, Depressed, Hopeless 2 1 1 1 1   PHQ - 2 Score 2 2 2 2 2   Altered sleeping 2 1 1 1 1   Tired, decreased energy 2 1 1 1 1   Change in appetite 0 1 1 1 1   Feeling bad or failure about yourself  2 1 1 1 1   Trouble concentrating 2 1 1 1 1   Moving slowly or fidgety/restless 0 0 0 1 1  Suicidal thoughts 0 0 0 0 0  PHQ-9 Score 10 7 7 8 8   Some recent data might be hidden     Past Medical History:  Diagnosis Date   ADD (attention deficit disorder)    Anxiety    Depression    Relevant past medical, surgical, family and social history reviewed and updated as indicated. Interim medical history since our last visit reviewed. Allergies and medications reviewed and updated. DATA REVIEWED: CHART IN EPIC  Family History reviewed for pertinent findings.  Review of Systems  Constitutional: Negative.  Negative for activity change, fatigue and fever.  HENT: Negative.   Eyes:  Negative.   Respiratory: Positive for cough, shortness of breath and wheezing.   Cardiovascular: Negative.  Negative for chest pain.  Gastrointestinal: Negative.  Negative for abdominal pain.  Endocrine: Negative.   Genitourinary: Negative.  Negative for dysuria.  Musculoskeletal: Negative.   Skin: Negative.   Neurological: Negative.     Allergies as of 07/30/2018      Reactions   Sulfa Antibiotics Nausea And Vomiting   Vioxx [rofecoxib]       Medication List       Accurate as of July 30, 2018 11:59 PM. If you have any questions, ask your nurse or doctor.        STOP taking these medications   sertraline 100 MG tablet Commonly known as: ZOLOFT Stopped by: Terald Sleeper, PA-C     TAKE these medications   ALPRAZolam 1 MG tablet Commonly known as: XANAX Take 1 tablet (1 mg total) by mouth 3 (three) times daily as needed for anxiety.   amphetamine-dextroamphetamine 10 MG 24 hr capsule Commonly known as: Adderall XR Take 1 capsule (10 mg total) by mouth daily.   amphetamine-dextroamphetamine 10 MG 24 hr capsule Commonly known as: Adderall  XR Take 1 capsule (10 mg total) by mouth daily.   amphetamine-dextroamphetamine 10 MG 24 hr capsule Commonly known as: Adderall XR Take 1 capsule (10 mg total) by mouth daily.   atorvastatin 10 MG tablet Commonly known as: LIPITOR Take 1 tablet (10 mg total) by mouth daily.   budesonide-formoterol 80-4.5 MCG/ACT inhaler Commonly known as: SYMBICORT Inhale 2 puffs into the lungs 2 (two) times daily. Started by: Terald Sleeper, PA-C   buPROPion 150 MG 24 hr tablet Commonly known as: WELLBUTRIN XL TAKE (1) TABLET DAILY IN THE MORNING.   dicyclomine 10 MG capsule Commonly known as: BENTYL TAKE 1 CAPSULE FOUR TIMES DAILY BEFORE MEALS AND AT BEDTIME AS NEEDED   escitalopram 20 MG tablet Commonly known as: Lexapro Take 1 tablet (20 mg total) by mouth daily. Started by: Terald Sleeper, PA-C   esomeprazole 40 MG capsule Commonly  known as: NEXIUM Take 1 capsule (40 mg total) by mouth daily at 12 noon.   gabapentin 100 MG capsule Commonly known as: NEURONTIN Take 1-3 capsules (100-300 mg total) by mouth at bedtime.   levothyroxine 125 MCG tablet Commonly known as: SYNTHROID Take 1 tablet (125 mcg total) by mouth daily.   meclizine 25 MG tablet Commonly known as: ANTIVERT TAKE (1) TABLET THREE TIMES DAILY AS NEEDED FOR DIZZINESS.   meloxicam 15 MG tablet Commonly known as: MOBIC Take 1 tablet (15 mg total) by mouth daily.   montelukast 10 MG tablet Commonly known as: SINGULAIR Take 1 tablet (10 mg total) by mouth at bedtime.   SUMAtriptan 50 MG tablet Commonly known as: IMITREX TAKE 1 OR 2 TABLETS AT ONSET OF MIGRAINE. MAY REPEAT IN 2 HOURS IF NEEDED   traMADol 50 MG tablet Commonly known as: ULTRAM Take 2 tablets (100 mg total) by mouth 4 (four) times daily. TAKE (2) TABLETS EVERY SIX HOURS AS NEEDED.   Ventolin HFA 108 (90 Base) MCG/ACT inhaler Generic drug: albuterol Inhale 2 puffs into the lungs every 6 hours as needed for wheezing orshortness of breath.   zolpidem 10 MG tablet Commonly known as: AMBIEN Take 1 tablet (10 mg total) by mouth at bedtime as needed for sleep. Started by: Terald Sleeper, PA-C          Objective:    BP 126/80    Pulse (!) 111    Temp (!) 97.2 F (36.2 C) (Oral)    Ht 5' 5"  (1.651 m)    Wt 171 lb 3.2 oz (77.7 kg)    BMI 28.49 kg/m   Allergies  Allergen Reactions   Sulfa Antibiotics Nausea And Vomiting   Vioxx [Rofecoxib]     Wt Readings from Last 3 Encounters:  07/30/18 171 lb 3.2 oz (77.7 kg)  03/21/18 165 lb (74.8 kg)  12/18/17 157 lb 3.2 oz (71.3 kg)    Physical Exam Constitutional:      Appearance: She is well-developed.  HENT:     Head: Normocephalic and atraumatic.  Eyes:     Conjunctiva/sclera: Conjunctivae normal.     Pupils: Pupils are equal, round, and reactive to light.  Cardiovascular:     Rate and Rhythm: Normal rate and regular  rhythm.     Heart sounds: Normal heart sounds.  Pulmonary:     Effort: Pulmonary effort is normal.  Skin:    General: Skin is warm and dry.     Findings: No rash.  Neurological:     Mental Status: She is alert and oriented to person, place,  and time.     Deep Tendon Reflexes: Reflexes are normal and symmetric.  Psychiatric:        Behavior: Behavior normal.     Results for orders placed or performed in visit on 07/30/18  CBC with Differential/Platelet  Result Value Ref Range   WBC 4.9 3.4 - 10.8 x10E3/uL   RBC 4.03 3.77 - 5.28 x10E6/uL   Hemoglobin 12.2 11.1 - 15.9 g/dL   Hematocrit 35.8 34.0 - 46.6 %   MCV 89 79 - 97 fL   MCH 30.3 26.6 - 33.0 pg   MCHC 34.1 31.5 - 35.7 g/dL   RDW 12.4 11.7 - 15.4 %   Platelets 283 150 - 450 x10E3/uL   Neutrophils 53 Not Estab. %   Lymphs 36 Not Estab. %   Monocytes 7 Not Estab. %   Eos 3 Not Estab. %   Basos 1 Not Estab. %   Neutrophils Absolute 2.6 1.4 - 7.0 x10E3/uL   Lymphocytes Absolute 1.7 0.7 - 3.1 x10E3/uL   Monocytes Absolute 0.3 0.1 - 0.9 x10E3/uL   EOS (ABSOLUTE) 0.2 0.0 - 0.4 x10E3/uL   Basophils Absolute 0.0 0.0 - 0.2 x10E3/uL   Immature Granulocytes 0 Not Estab. %   Immature Grans (Abs) 0.0 0.0 - 0.1 x10E3/uL  CMP14+EGFR  Result Value Ref Range   Glucose 70 65 - 99 mg/dL   BUN 13 6 - 24 mg/dL   Creatinine, Ser 0.81 0.57 - 1.00 mg/dL   GFR calc non Af Amer 81 >59 mL/min/1.73   GFR calc Af Amer 94 >59 mL/min/1.73   BUN/Creatinine Ratio 16 9 - 23   Sodium 142 134 - 144 mmol/L   Potassium 4.3 3.5 - 5.2 mmol/L   Chloride 101 96 - 106 mmol/L   CO2 21 20 - 29 mmol/L   Calcium 10.0 8.7 - 10.2 mg/dL   Total Protein 7.1 6.0 - 8.5 g/dL   Albumin 4.6 3.8 - 4.9 g/dL   Globulin, Total 2.5 1.5 - 4.5 g/dL   Albumin/Globulin Ratio 1.8 1.2 - 2.2   Bilirubin Total <0.2 0.0 - 1.2 mg/dL   Alkaline Phosphatase 85 39 - 117 IU/L   AST 23 0 - 40 IU/L   ALT 14 0 - 32 IU/L  Thyroid Panel With TSH  Result Value Ref Range   TSH 3.980  0.450 - 4.500 uIU/mL   T4, Total 8.0 4.5 - 12.0 ug/dL   T3 Uptake Ratio 23 (L) 24 - 39 %   Free Thyroxine Index 1.8 1.2 - 4.9      Assessment & Plan:   1. Acquired hypothyroidism - levothyroxine (SYNTHROID) 125 MCG tablet; Take 1 tablet (125 mcg total) by mouth daily.  Dispense: 30 tablet; Refill: 11 - CBC with Differential/Platelet - CMP14+EGFR - Thyroid Panel With TSH  2. Gastroesophageal reflux disease without esophagitis - CBC with Differential/Platelet - CMP14+EGFR - Thyroid Panel With TSH  3. Adjustment disorder with anxious mood - escitalopram (LEXAPRO) 20 MG tablet; Take 1 tablet (20 mg total) by mouth daily.  Dispense: 30 tablet; Refill: 2 - CBC with Differential/Platelet - CMP14+EGFR - Thyroid Panel With TSH  4. Wheeze - budesonide-formoterol (SYMBICORT) 80-4.5 MCG/ACT inhaler; Inhale 2 puffs into the lungs 2 (two) times daily.  Dispense: 1 Inhaler; Refill: 3  5. Primary insomnia - zolpidem (AMBIEN) 10 MG tablet; Take 1 tablet (10 mg total) by mouth at bedtime as needed for sleep.  Dispense: 30 tablet; Refill: 1   Continue all other maintenance medications as listed  above.  Follow up plan: Return in about 5 weeks (around 09/03/2018).  Educational handout given for depression  Terald Sleeper PA-C [facility] [address]  [place], [place] [address] [phone]   08/04/2018, 8:19 PM

## 2018-08-06 ENCOUNTER — Telehealth: Payer: Self-pay

## 2018-08-06 DIAGNOSIS — F411 Generalized anxiety disorder: Secondary | ICD-10-CM
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
## 2018-08-06 NOTE — BH Specialist Note (Signed)
° ° °
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
## 2018-08-25 ENCOUNTER — Other Ambulatory Visit: Payer: Self-pay | Admitting: Physician Assistant

## 2018-08-25 DIAGNOSIS — R42 Dizziness and giddiness: Secondary | ICD-10-CM

## 2018-08-27 ENCOUNTER — Telehealth: Payer: Self-pay

## 2018-08-27 DIAGNOSIS — F411 Generalized anxiety disorder: Secondary | ICD-10-CM

## 2018-08-27 NOTE — BH Specialist Note (Signed)
° ° ° ° °

## 2018-08-27 NOTE — BH Specialist Note (Signed)
[place] [facility] Telephone Follow-up  MRN: [id number] NAME: Phyllis Parker Date: 08/27/18   Start time: Start Time: 1030 End time: Stop Time: 1100 Total time: Total Time in Minutes (Visit): 30 Call number: Visit Number: 2- Second Visit    Reason for call today: Reason for Contact: PHQ9-2 weeks    PHQ-9 Scores:  Depression screen [facility] 2/9 08/27/2018 08/06/2018 07/30/2018 03/21/2018 12/18/2017  Decreased Interest 2 3 0 1 1  Down, Depressed, Hopeless 2 2 2 1 1   PHQ - 2 Score 4 5 2 2 2   Altered sleeping 0 1 2 1 1   Tired, decreased energy 0 0 2 1 1   Change in appetite 0 0 0 1 1  Feeling bad or failure about yourself  2 3 2 1 1   Trouble concentrating 1 0 2 1 1   Moving slowly or fidgety/restless 0 1 0 0 0  Suicidal thoughts 1 0 0 0 0  PHQ-9 Score 8 10 10 7 7   Difficult doing work/chores Somewhat difficult Somewhat difficult - - -  Some recent data might be hidden    GAD-7 Scores:  GAD 7 : Generalized Anxiety Score 08/27/2018 08/06/2018  Nervous, Anxious, on Edge 2 3  Control/stop worrying 2 2  Worry too much - different things 1 3  Trouble relaxing 1 1  Restless 1 1  Easily annoyed or irritable 1 1  Afraid - awful might happen 2 3  Total GAD 7 Score 10 14  Anxiety Difficulty Somewhat difficult Somewhat difficult     Stress Current stressors: Current Stressors: Body image Sleep: Sleep: No problems Appetite: Appetite: No problems Coping ability: Coping ability: Normal Patient taking medications as prescribed: Patient taking medications as prescribed: Yes    Current medications:  Outpatient Encounter Medications as of 08/27/2018  Medication Sig   ALPRAZolam (XANAX) 1 MG tablet Take 1 tablet (1 mg total) by mouth 3 (three) times daily as needed for anxiety.   amphetamine-dextroamphetamine (ADDERALL XR) 10 MG 24 hr capsule Take 1 capsule (10 mg total) by mouth daily.   amphetamine-dextroamphetamine (ADDERALL XR) 10 MG 24 hr capsule Take 1 capsule (10 mg total) by mouth  daily.   amphetamine-dextroamphetamine (ADDERALL XR) 10 MG 24 hr capsule Take 1 capsule (10 mg total) by mouth daily.   atorvastatin (LIPITOR) 10 MG tablet Take 1 tablet (10 mg total) by mouth daily.   budesonide-formoterol (SYMBICORT) 80-4.5 MCG/ACT inhaler Inhale 2 puffs into the lungs 2 (two) times daily.   buPROPion (WELLBUTRIN XL) 150 MG 24 hr tablet TAKE (1) TABLET DAILY IN THE MORNING.   dicyclomine (BENTYL) 10 MG capsule TAKE 1 CAPSULE FOUR TIMES DAILY BEFORE MEALS AND AT BEDTIME AS NEEDED   escitalopram (LEXAPRO) 20 MG tablet Take 1 tablet (20 mg total) by mouth daily.   esomeprazole (NEXIUM) 40 MG capsule Take 1 capsule (40 mg total) by mouth daily at 12 noon.   gabapentin (NEURONTIN) 100 MG capsule Take 1-3 capsules (100-300 mg total) by mouth at bedtime.   levothyroxine (SYNTHROID) 125 MCG tablet Take 1 tablet (125 mcg total) by mouth daily.   meclizine (ANTIVERT) 25 MG tablet TAKE (1) TABLET THREE TIMES DAILY AS NEEDED FOR DIZZINESS.   meloxicam (MOBIC) 15 MG tablet Take 1 tablet (15 mg total) by mouth daily.   montelukast (SINGULAIR) 10 MG tablet Take 1 tablet (10 mg total) by mouth at bedtime.   SUMAtriptan (IMITREX) 50 MG tablet TAKE 1 OR 2 TABLETS AT ONSET OF MIGRAINE. MAY REPEAT IN 2 HOURS IF  NEEDED   traMADol (ULTRAM) 50 MG tablet Take 2 tablets (100 mg total) by mouth 4 (four) times daily. TAKE (2) TABLETS EVERY SIX HOURS AS NEEDED.   VENTOLIN HFA 108 (90 Base) MCG/ACT inhaler Inhale 2 puffs into the lungs every 6 hours as needed for wheezing orshortness of breath.   zolpidem (AMBIEN) 10 MG tablet Take 1 tablet (10 mg total) by mouth at bedtime as needed for sleep.   No facility-administered encounter medications on file as of 08/27/2018.      Self-harm Behaviors Risk Assessment Self-harm risk factors: Self-harm risk factors: (None Reported) Patient endorses recent thoughts of harming self: Have you recently had any thoughts about harming yourself?:  No    Danger to Others Risk Assessment Danger to others risk factors: Danger to Others Risk Factors: No risk factors noted Patient endorses recent thoughts of harming others: Notification required: No need or identified person    Substance Use Assessment Patient recently consumed alcohol:  None Reported Patient recently used drugs:  None Reported    Goals, Interventions and Follow-up Plan Goals: Increase healthy adjustment to current life circumstances Interventions: Behavioral Activation and Supportive Counseling Follow-up Plan: [facility] Phone Follow UP     Summary:   Patient is a 58 year old female. Patient has a decrease in her PHQ and GAD score.    Patient reports improved mood since she has to take walks and take picutres with her camera.  Patient reports that engaging in self-care activities for has been very good for her.    Patient reports that she is still struggling with her self-image due to having 8 missing teeth.  Patient reports that her dental insurance cannot pay for all of the dental work that she needs.   Writer will provide the patient with a list of free dental resources that she will have to contact to determine if she meet their guidelines because she is employed.   Writer discussed communication techniques that she is able to utilize with her husband. Patient report that her husband will tell her oldest daughter things that he is going to purchase before he will tell her.  Patient reports that her husband recently purchased a pony for her granddaughter, and he plan to build a barn for the pony so that the pony will live in their back yard.    Medication:  Patient reports that she will be switched to Lexapro after she is being tapered off the Zoloft.  Patient reports that she is starting the Lexapro now and she has not taken it enough to tell a difference.   Patient reports that she takes Xanax 1mg  3x daily.  The Xanax is helpful.  Patient reports that if she does  not take the Xanax she will experience daily panic attacks.  She has been taking Xanax since 1993.     Patient denies SI/HI/Psychosis/Substance Abuse. If your symptoms worsen or you have thoughts of suicide/homicide, PLEASE SEEK IMMEDIATE MEDICAL ATTENTION.  You may always call:  National Suicide Hotline: 7252736569(414)381-4870; [place] Crisis Line: (504)314-2828(604) 305-5939; Crisis Recovery in [place]: 804 135 78107163162163.  These are available 24 hours a day, 7 days a week.    During the next session:   Patient will continue to go for a walk every day and take her camera with her.  Patient will find items on her walk that are beautiful to her or that make her happy and we will discuss it in the next session.  When Clinical research associatewriter finds any free dental resources  it will be emailed to her at  [email or web address] Patient will also complete the following  1. Journaling her food intake 2. 30 minutes a day walking 3. Writer down 3 positives that she likes about her self 4.   Graciella Freer LaVerne, LCAS-A

## 2018-09-06 ENCOUNTER — Other Ambulatory Visit: Payer: Self-pay | Admitting: Physician Assistant

## 2018-09-06 DIAGNOSIS — M25561 Pain in right knee: Secondary | ICD-10-CM

## 2018-09-06 DIAGNOSIS — M25562 Pain in left knee: Secondary | ICD-10-CM

## 2018-09-16 ENCOUNTER — Other Ambulatory Visit: Payer: Self-pay

## 2018-09-16 ENCOUNTER — Ambulatory Visit: Payer: 59 | Admitting: Physician Assistant

## 2018-09-16 ENCOUNTER — Telehealth: Payer: Self-pay

## 2018-09-16 ENCOUNTER — Encounter: Payer: Self-pay | Admitting: Physician Assistant

## 2018-09-16 VITALS — BP 120/81 | HR 98 | Temp 99.8°F | Ht 65.0 in | Wt 173.0 lb

## 2018-09-16 DIAGNOSIS — R062 Wheezing: Secondary | ICD-10-CM | POA: Diagnosis not present

## 2018-09-16 DIAGNOSIS — J4520 Mild intermittent asthma, uncomplicated: Secondary | ICD-10-CM

## 2018-09-16 DIAGNOSIS — F5101 Primary insomnia: Secondary | ICD-10-CM | POA: Diagnosis not present

## 2018-09-16 DIAGNOSIS — F4322 Adjustment disorder with anxiety: Secondary | ICD-10-CM | POA: Diagnosis not present

## 2018-09-16 DIAGNOSIS — M25561 Pain in right knee: Secondary | ICD-10-CM | POA: Diagnosis not present

## 2018-09-16 DIAGNOSIS — E7841 Elevated Lipoprotein(a): Secondary | ICD-10-CM

## 2018-09-16 DIAGNOSIS — M25562 Pain in left knee: Secondary | ICD-10-CM

## 2018-09-16 DIAGNOSIS — F411 Generalized anxiety disorder: Secondary | ICD-10-CM

## 2018-09-16 DIAGNOSIS — F900 Attention-deficit hyperactivity disorder, predominantly inattentive type: Secondary | ICD-10-CM

## 2018-09-16 MED ORDER — TRAMADOL HCL 50 MG PO TABS: 100.0000 mg | ORAL_TABLET | Freq: Four times a day (QID) | ORAL | 5 refills | Status: DC

## 2018-09-16 MED ORDER — ATORVASTATIN CALCIUM 10 MG PO TABS: 10.0000 mg | ORAL_TABLET | Freq: Every day | ORAL | 3 refills | Status: DC

## 2018-09-16 MED ORDER — ESCITALOPRAM OXALATE 20 MG PO TABS: 20.0000 mg | ORAL_TABLET | Freq: Every day | ORAL | 2 refills | Status: DC

## 2018-09-16 MED ORDER — AMPHETAMINE-DEXTROAMPHET ER 20 MG PO CP24: 20.0000 mg | ORAL_CAPSULE | Freq: Every day | ORAL | 0 refills | Status: DC

## 2018-09-16 MED ORDER — ALPRAZOLAM 1 MG PO TABS: 1.0000 mg | ORAL_TABLET | Freq: Three times a day (TID) | ORAL | 5 refills | Status: DC | PRN

## 2018-09-16 MED ORDER — ZOLPIDEM TARTRATE 10 MG PO TABS: 10.0000 mg | ORAL_TABLET | Freq: Every evening | ORAL | 1 refills | Status: DC | PRN

## 2018-09-16 NOTE — Progress Notes (Signed)
BP 120/81    Pulse 98    Temp 99.8 F (37.7 C) (Temporal)    Ht 5' 5"  (1.651 m)    Wt 173 lb (78.5 kg)    SpO2 99%    BMI 28.79 kg/m    Subjective:    Patient ID: Phyllis Parker, female    DOB: 1960-12-09, 58 y.o.   MRN: [id number]  HPI: Phyllis Parker is a 58 y.o. female presenting on 09/16/2018 for Depression (5 week follow up ) and Anxiety  Patient reports that overall she is doing about the same.  She did have she feels like since switching over to the Lexapro.  She has had opportunity to speak to a counselor every couple of weeks or so.  She states she has not talked to her in about 3 weeks.  She was quite upset today.  Have encouraged her to reach out and continue to talk to her on a every 1 to 2-week basis.  She agrees with this plan.  She continues with some wheezing more related to humidity when she is outside.  She has been using the Symbicort 1 daily.  And on the very humid days she states that she has left control and is still having upper airway tightness.  We will make a referral to pulmonology for further evaluation of her reactive airway.  She states her GERD is well controlled.  She does need some refills on some of her other medication.  We will` that today and we will plan for her to come back in 4 weeks and see how her depression is improved.  Depression screen [facility] 2/9 09/16/2018 08/27/2018 08/06/2018 07/30/2018 03/21/2018  Decreased Interest 2 2 3  0 1  Down, Depressed, Hopeless 2 2 2 2 1   PHQ - 2 Score 4 4 5 2 2   Altered sleeping 2 0 1 2 1   Tired, decreased energy 2 0 0 2 1  Change in appetite 2 0 0 0 1  Feeling bad or failure about yourself  1 2 3 2 1   Trouble concentrating 1 1 0 2 1  Moving slowly or fidgety/restless 1 0 1 0 0  Suicidal thoughts 0 1 0 0 0  PHQ-9 Score 13 8 10 10 7   Difficult doing work/chores - Somewhat difficult Somewhat difficult - -  Some recent data might be hidden   ANXIETY ASSESSMENT Cause of anxiety: Generalized anxiety disorder This patient returns for  a  month recheck on narcotic use for the above named condition(s)  Current medications-alprazolam lexapro 20 Other medications tried: BuSpar, Zoloft, Wellbutrin Medication side effects-none Any concerns-ongoing depression Any change in general medical condition-none Effectiveness of current meds-good PMP AWARE website reviewed: Yes Any suspicious activity on PMP Aware: No LME daily dose: 6  Contract on file 03/28/2018 Last UDS  03/21/2018  History of overdose or risk of abuse no  Past Medical History:  Diagnosis Date   ADD (attention deficit disorder)    Anxiety    Depression    Relevant past medical, surgical, family and social history reviewed and updated as indicated. Interim medical history since our last visit reviewed. Allergies and medications reviewed and updated. DATA REVIEWED: CHART IN EPIC  Family History reviewed for pertinent findings.  Review of Systems  Constitutional: Negative.   HENT: Negative.   Eyes: Negative.   Respiratory: Positive for shortness of breath and wheezing.   Cardiovascular: Negative.   Gastrointestinal: Negative.   Genitourinary: Negative.   Musculoskeletal: Positive for arthralgias.  Psychiatric/Behavioral:  Positive for decreased concentration and dysphoric mood. The patient is nervous/anxious.     Allergies as of 09/16/2018      Reactions   Sulfa Antibiotics Nausea And Vomiting   Vioxx [rofecoxib]       Medication List       Accurate as of September 16, 2018  1:09 PM. If you have any questions, ask your nurse or doctor.        STOP taking these medications   amphetamine-dextroamphetamine 10 MG 24 hr capsule Commonly known as: Adderall XR Replaced by: amphetamine-dextroamphetamine 20 MG 24 hr capsule Stopped by: Terald Sleeper, PA-C   amphetamine-dextroamphetamine 10 MG 24 hr capsule Commonly known as: Adderall XR Replaced by: amphetamine-dextroamphetamine 20 MG 24 hr capsule Stopped by: Terald Sleeper, PA-C    amphetamine-dextroamphetamine 10 MG 24 hr capsule Commonly known as: Adderall XR Replaced by: amphetamine-dextroamphetamine 20 MG 24 hr capsule Stopped by: Terald Sleeper, PA-C     TAKE these medications   ALPRAZolam 1 MG tablet Commonly known as: XANAX Take 1 tablet (1 mg total) by mouth 3 (three) times daily as needed for anxiety.   amphetamine-dextroamphetamine 20 MG 24 hr capsule Commonly known as: Adderall XR Take 1 capsule (20 mg total) by mouth daily. Replaces: amphetamine-dextroamphetamine 10 MG 24 hr capsule Started by: Terald Sleeper, PA-C   amphetamine-dextroamphetamine 20 MG 24 hr capsule Commonly known as: Adderall XR Take 1 capsule (20 mg total) by mouth daily. Replaces: amphetamine-dextroamphetamine 10 MG 24 hr capsule Started by: Terald Sleeper, PA-C   amphetamine-dextroamphetamine 20 MG 24 hr capsule Commonly known as: Adderall XR Take 1 capsule (20 mg total) by mouth daily. Replaces: amphetamine-dextroamphetamine 10 MG 24 hr capsule Started by: Terald Sleeper, PA-C   atorvastatin 10 MG tablet Commonly known as: LIPITOR Take 1 tablet (10 mg total) by mouth daily.   budesonide-formoterol 80-4.5 MCG/ACT inhaler Commonly known as: SYMBICORT Inhale 2 puffs into the lungs 2 (two) times daily.   buPROPion 150 MG 24 hr tablet Commonly known as: WELLBUTRIN XL TAKE (1) TABLET DAILY IN THE MORNING.   dicyclomine 10 MG capsule Commonly known as: BENTYL TAKE 1 CAPSULE FOUR TIMES DAILY BEFORE MEALS AND AT BEDTIME AS NEEDED   escitalopram 20 MG tablet Commonly known as: Lexapro Take 1 tablet (20 mg total) by mouth daily.   esomeprazole 40 MG capsule Commonly known as: NEXIUM Take 1 capsule (40 mg total) by mouth daily at 12 noon.   gabapentin 100 MG capsule Commonly known as: NEURONTIN Take 1-3 capsules (100-300 mg total) by mouth at bedtime.   levothyroxine 125 MCG tablet Commonly known as: SYNTHROID Take 1 tablet (125 mcg total) by mouth daily.    meclizine 25 MG tablet Commonly known as: ANTIVERT TAKE (1) TABLET THREE TIMES DAILY AS NEEDED FOR DIZZINESS.   meloxicam 15 MG tablet Commonly known as: MOBIC TAKE 1 TABLET DAILY   montelukast 10 MG tablet Commonly known as: SINGULAIR Take 1 tablet (10 mg total) by mouth at bedtime.   SUMAtriptan 50 MG tablet Commonly known as: IMITREX TAKE 1 OR 2 TABLETS AT ONSET OF MIGRAINE. MAY REPEAT IN 2 HOURS IF NEEDED   traMADol 50 MG tablet Commonly known as: ULTRAM Take 2 tablets (100 mg total) by mouth 4 (four) times daily. TAKE (2) TABLETS EVERY SIX HOURS AS NEEDED.   Ventolin HFA 108 (90 Base) MCG/ACT inhaler Generic drug: albuterol Inhale 2 puffs into the lungs every 6 hours as needed for wheezing orshortness of  breath.   zolpidem 10 MG tablet Commonly known as: AMBIEN Take 1 tablet (10 mg total) by mouth at bedtime as needed for sleep.          Objective:    BP 120/81    Pulse 98    Temp 99.8 F (37.7 C) (Temporal)    Ht 5' 5"  (1.651 m)    Wt 173 lb (78.5 kg)    SpO2 99%    BMI 28.79 kg/m   Allergies  Allergen Reactions   Sulfa Antibiotics Nausea And Vomiting   Vioxx [Rofecoxib]     Wt Readings from Last 3 Encounters:  09/16/18 173 lb (78.5 kg)  07/30/18 171 lb 3.2 oz (77.7 kg)  03/21/18 165 lb (74.8 kg)    Physical Exam Constitutional:      General: She is not in acute distress.    Appearance: Normal appearance. She is well-developed.  HENT:     Head: Normocephalic and atraumatic.  Cardiovascular:     Rate and Rhythm: Normal rate.  Pulmonary:     Effort: Pulmonary effort is normal.  Skin:    General: Skin is warm and dry.     Findings: No rash.  Neurological:     Mental Status: She is alert and oriented to person, place, and time.     Deep Tendon Reflexes: Reflexes are normal and symmetric.     Results for orders placed or performed in visit on 07/30/18  CBC with Differential/Platelet  Result Value Ref Range   WBC 4.9 3.4 - 10.8 x10E3/uL   RBC  4.03 3.77 - 5.28 x10E6/uL   Hemoglobin 12.2 11.1 - 15.9 g/dL   Hematocrit 35.8 34.0 - 46.6 %   MCV 89 79 - 97 fL   MCH 30.3 26.6 - 33.0 pg   MCHC 34.1 31.5 - 35.7 g/dL   RDW 12.4 11.7 - 15.4 %   Platelets 283 150 - 450 x10E3/uL   Neutrophils 53 Not Estab. %   Lymphs 36 Not Estab. %   Monocytes 7 Not Estab. %   Eos 3 Not Estab. %   Basos 1 Not Estab. %   Neutrophils Absolute 2.6 1.4 - 7.0 x10E3/uL   Lymphocytes Absolute 1.7 0.7 - 3.1 x10E3/uL   Monocytes Absolute 0.3 0.1 - 0.9 x10E3/uL   EOS (ABSOLUTE) 0.2 0.0 - 0.4 x10E3/uL   Basophils Absolute 0.0 0.0 - 0.2 x10E3/uL   Immature Granulocytes 0 Not Estab. %   Immature Grans (Abs) 0.0 0.0 - 0.1 x10E3/uL  CMP14+EGFR  Result Value Ref Range   Glucose 70 65 - 99 mg/dL   BUN 13 6 - 24 mg/dL   Creatinine, Ser 0.81 0.57 - 1.00 mg/dL   GFR calc non Af Amer 81 >59 mL/min/1.73   GFR calc Af Amer 94 >59 mL/min/1.73   BUN/Creatinine Ratio 16 9 - 23   Sodium 142 134 - 144 mmol/L   Potassium 4.3 3.5 - 5.2 mmol/L   Chloride 101 96 - 106 mmol/L   CO2 21 20 - 29 mmol/L   Calcium 10.0 8.7 - 10.2 mg/dL   Total Protein 7.1 6.0 - 8.5 g/dL   Albumin 4.6 3.8 - 4.9 g/dL   Globulin, Total 2.5 1.5 - 4.5 g/dL   Albumin/Globulin Ratio 1.8 1.2 - 2.2   Bilirubin Total <0.2 0.0 - 1.2 mg/dL   Alkaline Phosphatase 85 39 - 117 IU/L   AST 23 0 - 40 IU/L   ALT 14 0 - 32 IU/L  Thyroid Panel With  TSH  Result Value Ref Range   TSH 3.980 0.450 - 4.500 uIU/mL   T4, Total 8.0 4.5 - 12.0 ug/dL   T3 Uptake Ratio 23 (L) 24 - 39 %   Free Thyroxine Index 1.8 1.2 - 4.9      Assessment & Plan:   1. Adjustment disorder with anxious mood - escitalopram (LEXAPRO) 20 MG tablet; Take 1 tablet (20 mg total) by mouth daily.  Dispense: 30 tablet; Refill: 2  2. Arthralgia of both lower legs - traMADol (ULTRAM) 50 MG tablet; Take 2 tablets (100 mg total) by mouth 4 (four) times daily. TAKE (2) TABLETS EVERY SIX HOURS AS NEEDED.  Dispense: 90 tablet; Refill: 5  3.  Primary insomnia - zolpidem (AMBIEN) 10 MG tablet; Take 1 tablet (10 mg total) by mouth at bedtime as needed for sleep.  Dispense: 30 tablet; Refill: 1  4. GAD (generalized anxiety disorder) - ALPRAZolam (XANAX) 1 MG tablet; Take 1 tablet (1 mg total) by mouth 3 (three) times daily as needed for anxiety.  Dispense: 90 tablet; Refill: 5  5. Attention deficit hyperactivity disorder (ADHD), predominantly inattentive type - amphetamine-dextroamphetamine (ADDERALL XR) 20 MG 24 hr capsule; Take 1 capsule (20 mg total) by mouth daily.  Dispense: 30 capsule; Refill: 0 - amphetamine-dextroamphetamine (ADDERALL XR) 20 MG 24 hr capsule; Take 1 capsule (20 mg total) by mouth daily.  Dispense: 30 capsule; Refill: 0 - amphetamine-dextroamphetamine (ADDERALL XR) 20 MG 24 hr capsule; Take 1 capsule (20 mg total) by mouth daily.  Dispense: 30 capsule; Refill: 0  6. Wheeze - Ambulatory referral to Pulmonology  7. Mild intermittent reactive airway disease without complication - Ambulatory referral to Pulmonology  8. Elevated lipoprotein(a) - atorvastatin (LIPITOR) 10 MG tablet; Take 1 tablet (10 mg total) by mouth daily.  Dispense: 90 tablet; Refill: 3 - Lipid panel   Continue all other maintenance medications as listed above.  Follow up plan: Return in about 6 weeks (around 10/28/2018).  Educational handout given for [place] PA-C [place] [address]  [place], [place] [address] [phone]   09/16/2018, 1:09 PM

## 2018-09-16 NOTE — Telephone Encounter (Signed)
[facility] - Left Msg

## 2018-09-17 LAB — LIPID PANEL
Chol/HDL Ratio: 4 ratio (ref 0.0–4.4)
Cholesterol, Total: 250 mg/dL — ABNORMAL HIGH (ref 100–199)
HDL: 62 mg/dL (ref >39)
LDL Chol Calc (NIH): 140 mg/dL — ABNORMAL HIGH (ref 0–99)
Triglycerides: 267 mg/dL — ABNORMAL HIGH (ref 0–149)
VLDL Cholesterol Cal: 48 mg/dL — ABNORMAL HIGH (ref 5–40)

## 2018-09-24 ENCOUNTER — Telehealth: Payer: Self-pay

## 2018-09-24 NOTE — Telephone Encounter (Signed)
2nd attempt - [facility]  ° °

## 2018-09-29 ENCOUNTER — Telehealth: Payer: Self-pay

## 2018-09-29 NOTE — Telephone Encounter (Signed)
3rd attempt - [facility]

## 2018-09-30 ENCOUNTER — Telehealth: Payer: Self-pay

## 2018-09-30 NOTE — Telephone Encounter (Signed)
Patient reports that she is at work and she will call me back when she gets off at 4pm

## 2018-09-30 NOTE — Telephone Encounter (Signed)
[facility] - Patient did not call back at 4pm.  Writer attempted to contact the patient but the phone just rang and I was not able to leave a voice mail message.

## 2018-10-08 ENCOUNTER — Telehealth: Payer: Self-pay

## 2018-10-08 NOTE — Telephone Encounter (Signed)
Several attempts have been made to contact patient without success. Patient will be placed on the inactive list.  If services are needed again.  Please contact [facility] at [phone].    Information will be routed to the PCP and Dr. Modesta Messing

## 2018-12-16 ENCOUNTER — Other Ambulatory Visit: Payer: Self-pay | Admitting: Physician Assistant

## 2018-12-16 DIAGNOSIS — F5101 Primary insomnia: Secondary | ICD-10-CM

## 2019-01-05 ENCOUNTER — Other Ambulatory Visit: Payer: Self-pay | Admitting: Physician Assistant

## 2019-01-05 DIAGNOSIS — J209 Acute bronchitis, unspecified: Secondary | ICD-10-CM

## 2019-01-06 ENCOUNTER — Other Ambulatory Visit: Payer: Self-pay | Admitting: Physician Assistant

## 2019-01-06 DIAGNOSIS — G43809 Other migraine, not intractable, without status migrainosus: Secondary | ICD-10-CM

## 2019-01-12 ENCOUNTER — Other Ambulatory Visit: Payer: Self-pay | Admitting: Physician Assistant

## 2019-01-12 DIAGNOSIS — F5101 Primary insomnia: Secondary | ICD-10-CM

## 2019-01-13 ENCOUNTER — Other Ambulatory Visit: Payer: Self-pay | Admitting: Physician Assistant

## 2019-01-13 DIAGNOSIS — F5101 Primary insomnia: Secondary | ICD-10-CM

## 2019-02-02 ENCOUNTER — Other Ambulatory Visit: Payer: Self-pay

## 2019-02-03 ENCOUNTER — Encounter: Payer: Self-pay | Admitting: Physician Assistant

## 2019-02-03 ENCOUNTER — Ambulatory Visit: Payer: 59 | Admitting: Physician Assistant

## 2019-02-03 VITALS — BP 131/78 | HR 74 | Temp 98.4°F | Ht 65.0 in | Wt 174.2 lb

## 2019-02-03 DIAGNOSIS — J209 Acute bronchitis, unspecified: Secondary | ICD-10-CM | POA: Diagnosis not present

## 2019-02-03 DIAGNOSIS — F411 Generalized anxiety disorder: Secondary | ICD-10-CM

## 2019-02-03 DIAGNOSIS — E039 Hypothyroidism, unspecified: Secondary | ICD-10-CM

## 2019-02-03 DIAGNOSIS — F5101 Primary insomnia: Secondary | ICD-10-CM

## 2019-02-03 DIAGNOSIS — R062 Wheezing: Secondary | ICD-10-CM

## 2019-02-03 DIAGNOSIS — E7841 Elevated Lipoprotein(a): Secondary | ICD-10-CM

## 2019-02-03 DIAGNOSIS — M25562 Pain in left knee: Secondary | ICD-10-CM

## 2019-02-03 DIAGNOSIS — M25561 Pain in right knee: Secondary | ICD-10-CM

## 2019-02-03 MED ORDER — ZOLPIDEM TARTRATE 10 MG PO TABS: 10.0000 mg | ORAL_TABLET | Freq: Every evening | ORAL | 5 refills | Status: DC | PRN

## 2019-02-03 MED ORDER — ALPRAZOLAM 1 MG PO TABS: 1.0000 mg | ORAL_TABLET | Freq: Three times a day (TID) | ORAL | 5 refills | Status: DC | PRN

## 2019-02-03 MED ORDER — ALBUTEROL SULFATE HFA 108 (90 BASE) MCG/ACT IN AERS: INHALATION_SPRAY | RESPIRATORY_TRACT | 2 refills | Status: DC

## 2019-02-03 MED ORDER — DICYCLOMINE HCL 10 MG PO CAPS: ORAL_CAPSULE | ORAL | 2 refills | Status: DC

## 2019-02-03 MED ORDER — MELOXICAM 15 MG PO TABS: 15.0000 mg | ORAL_TABLET | Freq: Every day | ORAL | 11 refills | Status: DC

## 2019-02-03 MED ORDER — BUSPIRONE HCL 10 MG PO TABS: 10.0000 mg | ORAL_TABLET | Freq: Three times a day (TID) | ORAL | 2 refills | Status: DC

## 2019-02-03 MED ORDER — BUDESONIDE-FORMOTEROL FUMARATE 80-4.5 MCG/ACT IN AERO: 2.0000 | INHALATION_SPRAY | Freq: Two times a day (BID) | RESPIRATORY_TRACT | 3 refills | Status: DC

## 2019-02-03 NOTE — Patient Instructions (Signed)
Stress, Adult Stress is a normal reaction to life events. Stress is what you feel when life demands more than you are used to, or more than you think you can handle. Some stress can be useful, such as studying for a test or meeting a deadline at work. Stress that occurs too often or for too long can cause problems. It can affect your emotional health and interfere with relationships and normal daily activities. Too much stress can weaken your body's defense system (immune system) and increase your risk for physical illness. If you already have a medical problem, stress can make it worse. What are the causes? All sorts of life events can cause stress. An event that causes stress for one person may not be stressful for another person. Major life events, whether positive or negative, commonly cause stress. Examples include:  Losing a job or starting a new job.  Losing a loved one.  Moving to a new town or home.  Getting married or divorced.  Having a baby.  Getting injured or sick. Less obvious life events can also cause stress, especially if they occur day after day or in combination with each other. Examples include:  Working long hours.  Driving in traffic.  Caring for children.  Being in debt.  Being in a difficult relationship. What are the signs or symptoms? Stress can cause emotional symptoms, including:  Anxiety. This is feeling worried, afraid, on edge, overwhelmed, or out of control.  Anger, including irritation or impatience.  Depression. This is feeling sad, down, helpless, or guilty.  Trouble focusing, remembering, or making decisions. Stress can cause physical symptoms, including:  Aches and pains. These may affect your head, neck, back, stomach, or other areas of your body.  Tight muscles or a clenched jaw.  Low energy.  Trouble sleeping. Stress can cause unhealthy behaviors, including:  Eating to feel better (overeating) or skipping meals.  Working too  much or putting off tasks.  Smoking, drinking alcohol, or using drugs to feel better. How is this diagnosed? Stress is diagnosed through an assessment by your health care provider. He or she may diagnose this condition based on:  Your symptoms and any stressful life events.  Your medical history.  Tests to rule out other causes of your symptoms. Depending on your condition, your health care provider may refer you to a specialist for further evaluation. How is this treated?  Stress management techniques are the recommended treatment for stress. Medicine is not typically recommended for the treatment of stress. Techniques to reduce your reaction to stressful life events include:  Stress identification. Monitor yourself for symptoms of stress and identify what causes stress for you. These skills may help you to avoid or prepare for stressful events.  Time management. Set your priorities, keep a calendar of events, and learn to say no. Taking these actions can help you avoid making too many commitments. Techniques for coping with stress include:  Rethinking the problem. Try to think realistically about stressful events rather than ignoring them or overreacting. Try to find the positives in a stressful situation rather than focusing on the negatives.  Exercise. Physical exercise can release both physical and emotional tension. The key is to find a form of exercise that you enjoy and do it regularly.  Relaxation techniques. These relax the body and mind. The key is to find one or more that you enjoy and use the techniques regularly. Examples include: ? Meditation, deep breathing, or progressive relaxation techniques. ? Yoga or   tai chi. ? Biofeedback, mindfulness techniques, or journaling. ? Listening to music, being out in nature, or participating in other hobbies.  Practicing a healthy lifestyle. Eat a balanced diet, drink plenty of water, limit or avoid caffeine, and get plenty of  sleep.  Having a strong support network. Spend time with family, friends, or other people you enjoy being around. Express your feelings and talk things over with someone you trust. Counseling or talk therapy with a mental health professional may be helpful if you are having trouble managing stress on your own. Follow these instructions at home: Lifestyle   Avoid drugs.  Do not use any products that contain nicotine or tobacco, such as cigarettes, e-cigarettes, and chewing tobacco. If you need help quitting, ask your health care provider.  Limit alcohol intake to no more than 1 drink a day for nonpregnant women and 2 drinks a day for men. One drink equals 12 oz of beer, 5 oz of wine, or 1 oz of hard liquor  Do not use alcohol or drugs to relax.  Eat a balanced diet that includes fresh fruits and vegetables, whole grains, lean meats, fish, eggs, and beans, and low-fat dairy. Avoid processed foods and foods high in added fat, sugar, and salt.  Exercise at least 30 minutes on 5 or more days each week.  Get 7-8 hours of sleep each night. General instructions   Practice stress management techniques as discussed with your health care provider.  Drink enough fluid to keep your urine clear or pale yellow.  Take over-the-counter and prescription medicines only as told by your health care provider.  Keep all follow-up visits as told by your health care provider. This is important. Contact a health care provider if:  Your symptoms get worse.  You have new symptoms.  You feel overwhelmed by your problems and can no longer manage them on your own. Get help right away if:  You have thoughts of hurting yourself or others. If you ever feel like you may hurt yourself or others, or have thoughts about taking your own life, get help right away. You can go to your nearest emergency department or call:  Your local emergency services (911 in the [place]  A suicide crisis helpline, such as the  [place] at (316) 250-6172. This is open 24 hours a day. Summary  Stress is a normal reaction to life events. It can cause problems if it happens too often or for too long.  Practicing stress management techniques is the best way to treat stress.  Counseling or talk therapy with a mental health professional may be helpful if you are having trouble managing stress on your own. This information is not intended to replace advice given to you by your health care provider. Make sure you discuss any questions you have with your health care provider. Document Revised: 07/25/2018 Document Reviewed: 02/15/2016 Elsevier Patient Education  [place].

## 2019-02-04 DIAGNOSIS — F411 Generalized anxiety disorder: Secondary | ICD-10-CM | POA: Insufficient documentation

## 2019-02-04 DIAGNOSIS — E7841 Elevated Lipoprotein(a): Secondary | ICD-10-CM | POA: Insufficient documentation

## 2019-02-04 DIAGNOSIS — F5101 Primary insomnia: Secondary | ICD-10-CM | POA: Insufficient documentation

## 2019-02-04 LAB — CBC WITH DIFFERENTIAL/PLATELET
Basophils Absolute: 0 10*3/uL (ref 0.0–0.2)
Basos: 1 %
EOS (ABSOLUTE): 0.2 10*3/uL (ref 0.0–0.4)
Eos: 5 %
Hematocrit: 36.3 % (ref 34.0–46.6)
Hemoglobin: 12.2 g/dL (ref 11.1–15.9)
Immature Grans (Abs): 0 10*3/uL (ref 0.0–0.1)
Immature Granulocytes: 0 %
Lymphocytes Absolute: 1.7 10*3/uL (ref 0.7–3.1)
Lymphs: 36 %
MCH: 30.4 pg (ref 26.6–33.0)
MCHC: 33.6 g/dL (ref 31.5–35.7)
MCV: 91 fL (ref 79–97)
Monocytes Absolute: 0.3 10*3/uL (ref 0.1–0.9)
Monocytes: 6 %
Neutrophils Absolute: 2.4 10*3/uL (ref 1.4–7.0)
Neutrophils: 52 %
Platelets: 278 10*3/uL (ref 150–450)
RBC: 4.01 x10E6/uL (ref 3.77–5.28)
RDW: 12.9 % (ref 11.7–15.4)
WBC: 4.6 10*3/uL (ref 3.4–10.8)

## 2019-02-04 LAB — CMP14+EGFR
ALT: 22 IU/L (ref 0–32)
AST: 27 IU/L (ref 0–40)
Albumin/Globulin Ratio: 2.1 (ref 1.2–2.2)
Albumin: 4.8 g/dL (ref 3.8–4.9)
Alkaline Phosphatase: 103 IU/L (ref 39–117)
BUN/Creatinine Ratio: 13 (ref 9–23)
BUN: 12 mg/dL (ref 6–24)
Bilirubin Total: 0.3 mg/dL (ref 0.0–1.2)
CO2: 21 mmol/L (ref 20–29)
Calcium: 9.6 mg/dL (ref 8.7–10.2)
Chloride: 107 mmol/L — ABNORMAL HIGH (ref 96–106)
Creatinine, Ser: 0.89 mg/dL (ref 0.57–1.00)
GFR calc Af Amer: 84 mL/min/{1.73_m2} (ref >59)
GFR calc non Af Amer: 73 mL/min/{1.73_m2} (ref >59)
Globulin, Total: 2.3 g/dL (ref 1.5–4.5)
Glucose: 73 mg/dL (ref 65–99)
Potassium: 4 mmol/L (ref 3.5–5.2)
Sodium: 145 mmol/L — ABNORMAL HIGH (ref 134–144)
Total Protein: 7.1 g/dL (ref 6.0–8.5)

## 2019-02-04 LAB — THYROID PANEL WITH TSH
Free Thyroxine Index: 1.7 (ref 1.2–4.9)
T3 Uptake Ratio: 22 % — ABNORMAL LOW (ref 24–39)
T4, Total: 7.9 ug/dL (ref 4.5–12.0)
TSH: 3.75 u[IU]/mL (ref 0.450–4.500)

## 2019-02-04 LAB — LIPID PANEL
Chol/HDL Ratio: 2.8 ratio (ref 0.0–4.4)
Cholesterol, Total: 234 mg/dL — ABNORMAL HIGH (ref 100–199)
HDL: 85 mg/dL (ref >39)
LDL Chol Calc (NIH): 133 mg/dL — ABNORMAL HIGH (ref 0–99)
Triglycerides: 92 mg/dL (ref 0–149)
VLDL Cholesterol Cal: 16 mg/dL (ref 5–40)
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
## 2019-03-17 ENCOUNTER — Ambulatory Visit: Payer: 59 | Admitting: Physician Assistant

## 2019-03-23 ENCOUNTER — Other Ambulatory Visit: Payer: Self-pay

## 2019-03-23 ENCOUNTER — Ambulatory Visit: Payer: 59 | Admitting: Physician Assistant

## 2019-03-23 ENCOUNTER — Encounter: Payer: Self-pay | Admitting: Physician Assistant

## 2019-03-23 VITALS — BP 116/73 | HR 81 | Temp 98.0°F | Ht 65.0 in | Wt 175.4 lb

## 2019-03-23 DIAGNOSIS — M25562 Pain in left knee: Secondary | ICD-10-CM

## 2019-03-23 DIAGNOSIS — M25561 Pain in right knee: Secondary | ICD-10-CM

## 2019-03-23 DIAGNOSIS — F411 Generalized anxiety disorder: Secondary | ICD-10-CM

## 2019-03-23 MED ORDER — GABAPENTIN 100 MG PO CAPS: 100.0000 mg | ORAL_CAPSULE | Freq: Every day | ORAL | 3 refills | Status: DC

## 2019-03-23 MED ORDER — BUSPIRONE HCL 10 MG PO TABS: 10.0000 mg | ORAL_TABLET | Freq: Three times a day (TID) | ORAL | 5 refills | Status: DC

## 2019-03-23 NOTE — Progress Notes (Signed)
BP 116/73   Pulse 81   Temp 98 F (36.7 C)   Ht 5' 5"  (1.651 m)   Wt 175 lb 6.4 oz (79.6 kg)   SpO2 100%   BMI 29.19 kg/m    Subjective:    Patient ID: Phyllis Parker, female    DOB: [birth year], 59 y.o.   MRN: [id number]  Anxiety Presents for follow-up visit. Symptoms include decreased concentration, excessive worry, nervous/anxious behavior and panic. Symptoms occur constantly. The quality of sleep is poor.    Foot Injury  The incident occurred more than 1 week ago. The injury mechanism was an eversion injury. The pain is at a severity of 5/10. The pain is moderate.   1. Arthralgia of both lower legs  2. GAD (generalized anxiety disorder)   HPI: Phyllis Parker is a 59 y.o. female presenting on 03/23/2019 for Medical Management of Chronic Issues and Anxiety    Past Medical History:  Diagnosis Date  . ADD (attention deficit disorder)   . Anxiety   . Depression    Relevant past medical, surgical, family and social history reviewed and updated as indicated. Interim medical history since our last visit reviewed. Allergies and medications reviewed and updated. DATA REVIEWED: CHART IN EPIC  Family History reviewed for pertinent findings.  Review of Systems  Constitutional: Negative.   HENT: Negative.   Eyes: Negative.   Respiratory: Negative.   Gastrointestinal: Negative.   Genitourinary: Negative.   Musculoskeletal: Positive for arthralgias and gait problem.  Psychiatric/Behavioral: Positive for decreased concentration, dysphoric mood and sleep disturbance. The patient is nervous/anxious.     Allergies as of 03/23/2019      Reactions   Sulfa Antibiotics Nausea And Vomiting   Vioxx [rofecoxib]       Medication List       Accurate as of March 23, 2019 11:59 PM. If you have any questions, ask your nurse or doctor.        STOP taking these medications   buPROPion 150 MG 24 hr tablet Commonly known as: WELLBUTRIN XL Stopped by: Terald Sleeper, PA-C    traMADol 50 MG tablet Commonly known as: ULTRAM Stopped by: Terald Sleeper, PA-C     TAKE these medications   albuterol 108 (90 Base) MCG/ACT inhaler Commonly known as: Ventolin HFA Inhale 2 puffs into the lungs every 6 hours as needed for wheezing or shortness of breath.   ALPRAZolam 1 MG tablet Commonly known as: XANAX Take 1 tablet (1 mg total) by mouth 3 (three) times daily as needed for anxiety.   atorvastatin 10 MG tablet Commonly known as: LIPITOR Take 1 tablet (10 mg total) by mouth daily.   budesonide-formoterol 80-4.5 MCG/ACT inhaler Commonly known as: SYMBICORT Inhale 2 puffs into the lungs 2 (two) times daily.   busPIRone 10 MG tablet Commonly known as: BUSPAR Take 1 tablet (10 mg total) by mouth 3 (three) times daily.   dicyclomine 10 MG capsule Commonly known as: BENTYL TAKE 1 CAPSULE FOUR TIMES DAILY BEFORE MEALS AND AT BEDTIME AS NEEDED   esomeprazole 40 MG capsule Commonly known as: NEXIUM Take 1 capsule (40 mg total) by mouth daily at 12 noon.   gabapentin 100 MG capsule Commonly known as: NEURONTIN Take 1-3 capsules (100-300 mg total) by mouth at bedtime.   levothyroxine 125 MCG tablet Commonly known as: SYNTHROID Take 1 tablet (125 mcg total) by mouth daily.   meclizine 25 MG tablet Commonly known as: ANTIVERT TAKE (1) TABLET THREE  TIMES DAILY AS NEEDED FOR DIZZINESS.   meloxicam 15 MG tablet Commonly known as: MOBIC Take 1 tablet (15 mg total) by mouth daily.   montelukast 10 MG tablet Commonly known as: SINGULAIR Take 1 tablet (10 mg total) by mouth at bedtime.   sertraline 100 MG tablet Commonly known as: ZOLOFT Take 200 mg by mouth daily.   SUMAtriptan 50 MG tablet Commonly known as: IMITREX TAKE 1 OR 2 TABLETS AT ONSET OF MIGRAINE. MAY REPEAT IN 2 HOURS IF NEEDED   zolpidem 10 MG tablet Commonly known as: AMBIEN Take 1 tablet (10 mg total) by mouth at bedtime as needed. for sleep          Objective:    BP 116/73    Pulse 81   Temp 98 F (36.7 C)   Ht 5' 5"  (1.651 m)   Wt 175 lb 6.4 oz (79.6 kg)   SpO2 100%   BMI 29.19 kg/m   Allergies  Allergen Reactions  . Sulfa Antibiotics Nausea And Vomiting  . Vioxx [Rofecoxib]     Wt Readings from Last 3 Encounters:  03/23/19 175 lb 6.4 oz (79.6 kg)  02/03/19 174 lb 3.2 oz (79 kg)  09/16/18 173 lb (78.5 kg)    Physical Exam Constitutional:      General: She is not in acute distress.    Appearance: Normal appearance. She is well-developed.  HENT:     Head: Normocephalic and atraumatic.  Cardiovascular:     Rate and Rhythm: Normal rate.  Pulmonary:     Effort: Pulmonary effort is normal.  Skin:    General: Skin is warm and dry.     Findings: No rash.  Neurological:     Mental Status: She is alert and oriented to person, place, and time.     Deep Tendon Reflexes: Reflexes are normal and symmetric.     Results for orders placed or performed in visit on 02/03/19  CBC with Differential/Platelet  Result Value Ref Range   WBC 4.6 3.4 - 10.8 x10E3/uL   RBC 4.01 3.77 - 5.28 x10E6/uL   Hemoglobin 12.2 11.1 - 15.9 g/dL   Hematocrit 36.3 34.0 - 46.6 %   MCV 91 79 - 97 fL   MCH 30.4 26.6 - 33.0 pg   MCHC 33.6 31.5 - 35.7 g/dL   RDW 12.9 11.7 - 15.4 %   Platelets 278 150 - 450 x10E3/uL   Neutrophils 52 Not Estab. %   Lymphs 36 Not Estab. %   Monocytes 6 Not Estab. %   Eos 5 Not Estab. %   Basos 1 Not Estab. %   Neutrophils Absolute 2.4 1.4 - 7.0 x10E3/uL   Lymphocytes Absolute 1.7 0.7 - 3.1 x10E3/uL   Monocytes Absolute 0.3 0.1 - 0.9 x10E3/uL   EOS (ABSOLUTE) 0.2 0.0 - 0.4 x10E3/uL   Basophils Absolute 0.0 0.0 - 0.2 x10E3/uL   Immature Granulocytes 0 Not Estab. %   Immature Grans (Abs) 0.0 0.0 - 0.1 x10E3/uL  CMP14+EGFR  Result Value Ref Range   Glucose 73 65 - 99 mg/dL   BUN 12 6 - 24 mg/dL   Creatinine, Ser 0.89 0.57 - 1.00 mg/dL   GFR calc non Af Amer 73 >59 mL/min/1.73   GFR calc Af Amer 84 >59 mL/min/1.73   BUN/Creatinine Ratio 13  9 - 23   Sodium 145 (H) 134 - 144 mmol/L   Potassium 4.0 3.5 - 5.2 mmol/L   Chloride 107 (H) 96 - 106 mmol/L  CO2 21 20 - 29 mmol/L   Calcium 9.6 8.7 - 10.2 mg/dL   Total Protein 7.1 6.0 - 8.5 g/dL   Albumin 4.8 3.8 - 4.9 g/dL   Globulin, Total 2.3 1.5 - 4.5 g/dL   Albumin/Globulin Ratio 2.1 1.2 - 2.2   Bilirubin Total 0.3 0.0 - 1.2 mg/dL   Alkaline Phosphatase 103 39 - 117 IU/L   AST 27 0 - 40 IU/L   ALT 22 0 - 32 IU/L  Lipid panel  Result Value Ref Range   Cholesterol, Total 234 (H) 100 - 199 mg/dL   Triglycerides 92 0 - 149 mg/dL   HDL 85 >39 mg/dL   VLDL Cholesterol Cal 16 5 - 40 mg/dL   LDL Chol Calc (NIH) 133 (H) 0 - 99 mg/dL   Chol/HDL Ratio 2.8 0.0 - 4.4 ratio  Thyroid Panel With TSH  Result Value Ref Range   TSH 3.750 0.450 - 4.500 uIU/mL   T4, Total 7.9 4.5 - 12.0 ug/dL   T3 Uptake Ratio 22 (L) 24 - 39 %   Free Thyroxine Index 1.7 1.2 - 4.9      Assessment & Plan:   1. Arthralgia of both lower legs - gabapentin (NEURONTIN) 100 MG capsule; Take 1-3 capsules (100-300 mg total) by mouth at bedtime.  Dispense: 90 capsule; Refill: 3  2. GAD (generalized anxiety disorder) - sertraline (ZOLOFT) 100 MG tablet; Take 200 mg by mouth daily. - busPIRone (BUSPAR) 10 MG tablet; Take 1 tablet (10 mg total) by mouth 3 (three) times daily.  Dispense: 90 tablet; Refill: 5 - ToxASSURE Select 13 (MW), Urine   Continue all other maintenance medications as listed above.  Follow up plan: Return in about 3 months (around 06/30/2019) for recheck medications.  Educational handout given for [place] PA-C [place] [address]  [place], [place] [address] [phone]   03/24/2019, 6:05 PM

## 2019-03-25 LAB — TOXASSURE SELECT 13 (MW), URINE

## 2019-04-23 ENCOUNTER — Other Ambulatory Visit: Payer: Self-pay | Admitting: *Deleted

## 2019-04-23 DIAGNOSIS — G43809 Other migraine, not intractable, without status migrainosus: Secondary | ICD-10-CM

## 2019-04-24 MED ORDER — SUMATRIPTAN SUCCINATE 50 MG PO TABS: ORAL_TABLET | ORAL | 0 refills | Status: DC

## 2019-04-28 ENCOUNTER — Other Ambulatory Visit: Payer: Self-pay | Admitting: *Deleted

## 2019-04-28 DIAGNOSIS — F411 Generalized anxiety disorder: Secondary | ICD-10-CM

## 2019-04-29 MED ORDER — SERTRALINE HCL 100 MG PO TABS: 200.0000 mg | ORAL_TABLET | Freq: Every day | ORAL | 1 refills | Status: DC

## 2019-06-24 ENCOUNTER — Ambulatory Visit: Payer: 59 | Admitting: Nurse Practitioner

## 2019-06-24 ENCOUNTER — Ambulatory Visit: Payer: 59 | Admitting: Physician Assistant

## 2019-07-08 ENCOUNTER — Encounter: Payer: Self-pay | Admitting: Family

## 2019-07-08 ENCOUNTER — Other Ambulatory Visit: Payer: Self-pay

## 2019-07-08 ENCOUNTER — Ambulatory Visit: Payer: 59 | Admitting: Family

## 2019-07-08 VITALS — BP 94/64 | HR 101 | Temp 97.6°F | Ht 65.0 in | Wt 171.6 lb

## 2019-07-08 DIAGNOSIS — F4322 Adjustment disorder with anxiety: Secondary | ICD-10-CM

## 2019-07-08 DIAGNOSIS — M25562 Pain in left knee: Secondary | ICD-10-CM

## 2019-07-08 DIAGNOSIS — K219 Gastro-esophageal reflux disease without esophagitis: Secondary | ICD-10-CM | POA: Diagnosis not present

## 2019-07-08 DIAGNOSIS — E039 Hypothyroidism, unspecified: Secondary | ICD-10-CM

## 2019-07-08 DIAGNOSIS — R062 Wheezing: Secondary | ICD-10-CM

## 2019-07-08 DIAGNOSIS — F331 Major depressive disorder, recurrent, moderate: Secondary | ICD-10-CM

## 2019-07-08 DIAGNOSIS — F99 Mental disorder, not otherwise specified: Secondary | ICD-10-CM

## 2019-07-08 DIAGNOSIS — M25561 Pain in right knee: Secondary | ICD-10-CM

## 2019-07-08 DIAGNOSIS — F411 Generalized anxiety disorder: Secondary | ICD-10-CM

## 2019-07-08 DIAGNOSIS — F1320 Sedative, hypnotic or anxiolytic dependence, uncomplicated: Secondary | ICD-10-CM

## 2019-07-08 DIAGNOSIS — F5101 Primary insomnia: Secondary | ICD-10-CM

## 2019-07-08 DIAGNOSIS — Z79899 Other long term (current) drug therapy: Secondary | ICD-10-CM

## 2019-07-08 DIAGNOSIS — F5105 Insomnia due to other mental disorder: Secondary | ICD-10-CM

## 2019-07-08 MED ORDER — BUPROPION HCL ER (XL) 150 MG PO TB24: 150.0000 mg | ORAL_TABLET | Freq: Every day | ORAL | 1 refills | Status: DC

## 2019-07-08 MED ORDER — ZOLPIDEM TARTRATE 10 MG PO TABS: 10.0000 mg | ORAL_TABLET | Freq: Every evening | ORAL | 5 refills | Status: DC | PRN

## 2019-07-08 MED ORDER — GABAPENTIN 100 MG PO CAPS: 100.0000 mg | ORAL_CAPSULE | Freq: Every day | ORAL | 3 refills | Status: DC

## 2019-07-08 MED ORDER — BUSPIRONE HCL 15 MG PO TABS: 15.0000 mg | ORAL_TABLET | Freq: Three times a day (TID) | ORAL | 2 refills | Status: DC

## 2019-07-08 MED ORDER — ALPRAZOLAM 1 MG PO TABS: 1.0000 mg | ORAL_TABLET | Freq: Three times a day (TID) | ORAL | 5 refills | Status: DC | PRN

## 2019-07-08 NOTE — Patient Instructions (Signed)

## 2019-07-08 NOTE — Progress Notes (Signed)
Subjective:    Patient ID: Phyllis Parker, female    DOB: Sep 10, 1960, 59 y.o.   MRN: [id number]  Chief Complaint  Patient presents with  . Medical Management of Chronic Issues    discuss wellbutrin   PT presents to the office today to establish care.  Anxiety Presents for follow-up visit. Symptoms include depressed mood, irritability, nervous/anxious behavior and restlessness. Symptoms occur occasionally. The severity of symptoms is moderate.    Hyperlipidemia This is a chronic problem. The current episode started more than 1 year ago. The problem is uncontrolled. Recent lipid tests were reviewed and are high. Exacerbating diseases include obesity. Current antihyperlipidemic treatment includes statins. The current treatment provides moderate improvement of lipids. Risk factors for coronary artery disease include dyslipidemia and hypertension.  Gastroesophageal Reflux She complains of belching and heartburn. This is a chronic problem. The current episode started more than 1 year ago. The problem occurs occasionally. The problem has been waxing and waning. The heartburn limits her activity. The symptoms are aggravated by certain foods. Pertinent negatives include no fatigue. She has tried a PPI for the symptoms. The treatment provided moderate relief.  Thyroid Problem Presents for follow-up visit. Symptoms include anxiety and depressed mood. Patient reports no fatigue. Her past medical history is significant for hyperlipidemia.  Depression        This is a chronic problem.  The current episode started more than 1 year ago.   The onset quality is gradual.   The problem occurs intermittently.  The problem has been waxing and waning since onset.  Associated symptoms include helplessness, hopelessness, irritable and restlessness.  Associated symptoms include no fatigue.  Past medical history includes thyroid problem and anxiety.       Review of Systems  Constitutional: Positive for irritability.  Negative for fatigue.  Gastrointestinal: Positive for heartburn.  Psychiatric/Behavioral: Positive for depression. The patient is nervous/anxious.        Objective:   Physical Exam Vitals reviewed.  Constitutional:      General: She is irritable. She is not in acute distress.    Appearance: She is well-developed. She is obese.  HENT:     Head: Normocephalic and atraumatic.     Right Ear: Tympanic membrane normal.     Left Ear: Tympanic membrane normal.  Eyes:     Pupils: Pupils are equal, round, and reactive to light.  Neck:     Thyroid: No thyromegaly.  Cardiovascular:     Rate and Rhythm: Normal rate and regular rhythm.     Heart sounds: Normal heart sounds. No murmur heard.   Pulmonary:     Effort: Pulmonary effort is normal. No respiratory distress.     Breath sounds: Normal breath sounds. No wheezing.  Abdominal:     General: Bowel sounds are normal. There is no distension.     Palpations: Abdomen is soft.     Tenderness: There is no abdominal tenderness.  Musculoskeletal:        General: No tenderness. Normal range of motion.     Cervical back: Normal range of motion and neck supple.  Skin:    General: Skin is warm and dry.  Neurological:     Mental Status: She is alert and oriented to person, place, and time.     Cranial Nerves: No cranial nerve deficit.     Deep Tendon Reflexes: Reflexes are normal and symmetric.  Psychiatric:        Mood and Affect: Mood is anxious.  Behavior: Behavior normal.        Thought Content: Thought content normal.        Judgment: Judgment normal.     BP 94/64   Pulse (!) 101   Temp 97.6 F (36.4 C) (Temporal)   Ht 5' 5"  (1.651 m)   Wt 171 lb 9.6 oz (77.8 kg)   SpO2 93%   BMI 28.56 kg/m        Assessment & Plan:  Phyllis Parker comes in today with chief complaint of Medical Management of Chronic Issues (discuss wellbutrin)   Diagnosis and orders addressed:  1. Gastroesophageal reflux disease without  esophagitis - CMP14+EGFR - CBC with Differential/Platelet  2. Acquired hypothyroidism - CMP14+EGFR - CBC with Differential/Platelet - TSH  3. Adjustment disorder with anxious mood -Will increase Buspar to 15 mg TID from 10 mg and will add wellbutrin 150 mg today We will decrease xanax to #60 from #90 Stress management discussed She does not wish for a referral to behavorial health placed today - buPROPion (WELLBUTRIN XL) 150 MG 24 hr tablet; Take 1 tablet (150 mg total) by mouth daily.  Dispense: 90 each; Refill: 1 - CMP14+EGFR - CBC with Differential/Platelet  4. Moderate episode of recurrent major depressive disorder (HCC)  - CMP14+EGFR - CBC with Differential/Platelet  5. GAD (generalized anxiety disorder) -Will increase Buspar to 15 mg TID from 10 mg and will add wellbutrin 150 mg today We will decrease xanax to #60 from #90 Stress management discussed She does not wish for a referral to behavorial health placed today - buPROPion (WELLBUTRIN XL) 150 MG 24 hr tablet; Take 1 tablet (150 mg total) by mouth daily.  Dispense: 90 each; Refill: 1 - ALPRAZolam (XANAX) 1 MG tablet; Take 1 tablet (1 mg total) by mouth 3 (three) times daily as needed for anxiety.  Dispense: 60 tablet; Refill: 5 - CMP14+EGFR - CBC with Differential/Platelet  6. Insomnia due to other mental disorder - CMP14+EGFR - CBC with Differential/Platelet  7. Wheezing - Ambulatory referral to Pulmonology - CMP14+EGFR - CBC with Differential/Platelet  8. Controlled substance agreement signed - CMP14+EGFR - CBC with Differential/Platelet - ToxASSURE Select 13 (MW), Urine  9. Benzodiazepine dependence (HCC) - CMP14+EGFR - CBC with Differential/Platelet - ToxASSURE Select 13 (MW), Urine  10. Arthralgia of both lower legs - gabapentin (NEURONTIN) 100 MG capsule; Take 1-3 capsules (100-300 mg total) by mouth at bedtime.  Dispense: 90 capsule; Refill: 3  11. Primary insomnia - zolpidem (AMBIEN) 10 MG  tablet; Take 1 tablet (10 mg total) by mouth at bedtime as needed. for sleep  Dispense: 30 tablet; Refill: 5   Labs pending Patient reviewed in [place] controlled database, no flags noted. Contract and drug screen are up dated today.  Health Maintenance reviewed Diet and exercise encouraged  Follow up plan: 3 months    Evelina Dun, FNP

## 2019-07-09 ENCOUNTER — Other Ambulatory Visit: Payer: Self-pay | Admitting: Family

## 2019-07-09 LAB — CMP14+EGFR
ALT: 22 IU/L (ref 0–32)
AST: 34 IU/L (ref 0–40)
Albumin/Globulin Ratio: 1.7 (ref 1.2–2.2)
Albumin: 4.5 g/dL (ref 3.8–4.9)
Alkaline Phosphatase: 105 IU/L (ref 48–121)
BUN/Creatinine Ratio: 12 (ref 9–23)
BUN: 11 mg/dL (ref 6–24)
Bilirubin Total: 0.3 mg/dL (ref 0.0–1.2)
CO2: 21 mmol/L (ref 20–29)
Calcium: 9.7 mg/dL (ref 8.7–10.2)
Chloride: 104 mmol/L (ref 96–106)
Creatinine, Ser: 0.9 mg/dL (ref 0.57–1.00)
GFR calc Af Amer: 82 mL/min/{1.73_m2} (ref >59)
GFR calc non Af Amer: 71 mL/min/{1.73_m2} (ref >59)
Globulin, Total: 2.6 g/dL (ref 1.5–4.5)
Glucose: 105 mg/dL — ABNORMAL HIGH (ref 65–99)
Potassium: 4.2 mmol/L (ref 3.5–5.2)
Sodium: 142 mmol/L (ref 134–144)
Total Protein: 7.1 g/dL (ref 6.0–8.5)

## 2019-07-09 LAB — CBC WITH DIFFERENTIAL/PLATELET
Basophils Absolute: 0 10*3/uL (ref 0.0–0.2)
Basos: 1 %
EOS (ABSOLUTE): 0.1 10*3/uL (ref 0.0–0.4)
Eos: 3 %
Hematocrit: 38.1 % (ref 34.0–46.6)
Hemoglobin: 12.6 g/dL (ref 11.1–15.9)
Immature Grans (Abs): 0 10*3/uL (ref 0.0–0.1)
Immature Granulocytes: 0 %
Lymphocytes Absolute: 1.5 10*3/uL (ref 0.7–3.1)
Lymphs: 39 %
MCH: 30.4 pg (ref 26.6–33.0)
MCHC: 33.1 g/dL (ref 31.5–35.7)
MCV: 92 fL (ref 79–97)
Monocytes Absolute: 0.3 10*3/uL (ref 0.1–0.9)
Monocytes: 8 %
Neutrophils Absolute: 1.9 10*3/uL (ref 1.4–7.0)
Neutrophils: 49 %
Platelets: 273 10*3/uL (ref 150–450)
RBC: 4.15 x10E6/uL (ref 3.77–5.28)
RDW: 13 % (ref 11.7–15.4)
WBC: 3.8 10*3/uL (ref 3.4–10.8)

## 2019-07-09 LAB — TSH: TSH: 4.77 u[IU]/mL — ABNORMAL HIGH (ref 0.450–4.500)

## 2019-07-09 MED ORDER — LEVOTHYROXINE SODIUM 150 MCG PO TABS
150.0000 ug | ORAL_TABLET | Freq: Every day | ORAL | 11 refills | Status: DC
Start: 2019-07-09 — End: 2019-11-12

## 2019-07-15 LAB — TOXASSURE SELECT 13 (MW), URINE

## 2019-07-17 ENCOUNTER — Telehealth: Payer: Self-pay | Admitting: *Deleted

## 2019-07-17 DIAGNOSIS — K219 Gastro-esophageal reflux disease without esophagitis: Secondary | ICD-10-CM

## 2019-07-17 MED ORDER — ESOMEPRAZOLE MAGNESIUM 40 MG PO CPDR: 40.0000 mg | DELAYED_RELEASE_CAPSULE | Freq: Every day | ORAL | 11 refills | Status: DC

## 2019-07-17 NOTE — Telephone Encounter (Signed)
Esomeprazole DR 40 mg refill request from pharmacy-pt saw [facility] 07/08/19

## 2019-08-06 ENCOUNTER — Other Ambulatory Visit: Payer: Self-pay | Admitting: *Deleted

## 2019-08-06 DIAGNOSIS — R42 Dizziness and giddiness: Secondary | ICD-10-CM

## 2019-08-06 MED ORDER — MECLIZINE HCL 25 MG PO TABS: ORAL_TABLET | ORAL | 0 refills | Status: DC

## 2019-08-31 ENCOUNTER — Other Ambulatory Visit: Payer: Self-pay | Admitting: *Deleted

## 2019-08-31 MED ORDER — DICYCLOMINE HCL 10 MG PO CAPS: ORAL_CAPSULE | ORAL | 2 refills | Status: DC

## 2019-09-08 ENCOUNTER — Other Ambulatory Visit: Payer: Self-pay | Admitting: *Deleted

## 2019-09-08 DIAGNOSIS — R062 Wheezing: Secondary | ICD-10-CM

## 2019-09-08 MED ORDER — MONTELUKAST SODIUM 10 MG PO TABS: 10.0000 mg | ORAL_TABLET | Freq: Every day | ORAL | 5 refills | Status: DC

## 2019-09-09 ENCOUNTER — Encounter: Payer: Self-pay | Admitting: *Deleted

## 2019-10-06 ENCOUNTER — Other Ambulatory Visit: Payer: Self-pay | Admitting: Nurse Practitioner

## 2019-10-06 ENCOUNTER — Encounter: Payer: Self-pay | Admitting: *Deleted

## 2019-10-06 DIAGNOSIS — F411 Generalized anxiety disorder: Secondary | ICD-10-CM

## 2019-10-23 ENCOUNTER — Ambulatory Visit: Payer: 59 | Admitting: Family

## 2019-10-28 ENCOUNTER — Ambulatory Visit: Payer: 59 | Admitting: Family

## 2019-11-02 ENCOUNTER — Other Ambulatory Visit: Payer: Self-pay | Admitting: Nurse Practitioner

## 2019-11-02 DIAGNOSIS — F411 Generalized anxiety disorder: Secondary | ICD-10-CM

## 2019-11-10 ENCOUNTER — Ambulatory Visit: Payer: 59 | Admitting: Family

## 2019-11-10 ENCOUNTER — Encounter: Payer: Self-pay | Admitting: Family

## 2019-11-10 ENCOUNTER — Other Ambulatory Visit: Payer: Self-pay

## 2019-11-10 VITALS — BP 123/81 | HR 96 | Temp 97.6°F | Ht 65.0 in | Wt 168.0 lb

## 2019-11-10 DIAGNOSIS — R42 Dizziness and giddiness: Secondary | ICD-10-CM

## 2019-11-10 DIAGNOSIS — K219 Gastro-esophageal reflux disease without esophagitis: Secondary | ICD-10-CM

## 2019-11-10 DIAGNOSIS — E039 Hypothyroidism, unspecified: Secondary | ICD-10-CM

## 2019-11-10 DIAGNOSIS — F411 Generalized anxiety disorder: Secondary | ICD-10-CM | POA: Diagnosis not present

## 2019-11-10 DIAGNOSIS — Z1159 Encounter for screening for other viral diseases: Secondary | ICD-10-CM

## 2019-11-10 DIAGNOSIS — Z23 Encounter for immunization: Secondary | ICD-10-CM | POA: Diagnosis not present

## 2019-11-10 DIAGNOSIS — F5101 Primary insomnia: Secondary | ICD-10-CM

## 2019-11-10 DIAGNOSIS — F331 Major depressive disorder, recurrent, moderate: Secondary | ICD-10-CM

## 2019-11-10 DIAGNOSIS — Z114 Encounter for screening for human immunodeficiency virus [HIV]: Secondary | ICD-10-CM

## 2019-11-10 DIAGNOSIS — Z1211 Encounter for screening for malignant neoplasm of colon: Secondary | ICD-10-CM

## 2019-11-10 MED ORDER — ALPRAZOLAM 1 MG PO TABS: 1.0000 mg | ORAL_TABLET | Freq: Three times a day (TID) | ORAL | 5 refills | Status: DC | PRN

## 2019-11-10 MED ORDER — MECLIZINE HCL 25 MG PO TABS: ORAL_TABLET | ORAL | 0 refills | Status: DC

## 2019-11-10 MED ORDER — ZOLPIDEM TARTRATE 10 MG PO TABS: 10.0000 mg | ORAL_TABLET | Freq: Every evening | ORAL | 5 refills | Status: DC | PRN

## 2019-11-10 NOTE — Patient Instructions (Signed)

## 2019-11-10 NOTE — Progress Notes (Signed)
Subjective:    Patient ID: Phyllis Parker, female    DOB: 01-Jul-1960, 59 y.o.   MRN: [id number]  Chief Complaint  Patient presents with   Medical Management of Chronic Issues   PT presents to the office today for chronic follow up.  Anxiety Presents for follow-up visit. Symptoms include excessive worry, insomnia, irritability, nervous/anxious behavior and restlessness. Symptoms occur most days. The severity of symptoms is moderate.    Gastroesophageal Reflux She complains of belching and heartburn. This is a chronic problem. The current episode started more than 1 year ago. The problem occurs occasionally. Associated symptoms include fatigue. She has tried a PPI for the symptoms. The treatment provided moderate relief.  Insomnia Primary symptoms: difficulty falling asleep, frequent awakening.  The current episode started more than one year. The onset quality is gradual. The problem occurs intermittently. PMH includes: depression.  Depression        This is a chronic problem.  The current episode started more than 1 year ago.   The problem occurs intermittently.  Associated symptoms include fatigue, helplessness, hopelessness, insomnia, irritable and restlessness.  Past treatments include SSRIs - Selective serotonin reuptake inhibitors.  Past medical history includes thyroid problem and anxiety.   Thyroid Problem Presents for follow-up visit. Symptoms include anxiety, fatigue and heat intolerance. The symptoms have been worsening. Her past medical history is significant for hyperlipidemia.  Hyperlipidemia This is a chronic problem. The current episode started more than 1 year ago. The problem is uncontrolled. Recent lipid tests were reviewed and are high. Current antihyperlipidemic treatment includes statins. The current treatment provides moderate improvement of lipids. Risk factors for coronary artery disease include stress and a sedentary lifestyle.      Review of Systems   Constitutional: Positive for fatigue and irritability.  Gastrointestinal: Positive for heartburn.  Endocrine: Positive for heat intolerance.  Psychiatric/Behavioral: Positive for depression. The patient is nervous/anxious and has insomnia.   All other systems reviewed and are negative.      Objective:   Physical Exam Vitals reviewed.  Constitutional:      General: She is irritable. She is not in acute distress.    Appearance: She is well-developed.  HENT:     Head: Normocephalic and atraumatic.     Right Ear: Tympanic membrane normal.     Left Ear: Tympanic membrane normal.  Eyes:     Pupils: Pupils are equal, round, and reactive to light.  Neck:     Thyroid: No thyromegaly.  Cardiovascular:     Rate and Rhythm: Normal rate and regular rhythm.     Heart sounds: Normal heart sounds. No murmur heard.   Pulmonary:     Effort: Pulmonary effort is normal. No respiratory distress.     Breath sounds: Normal breath sounds. No wheezing.  Abdominal:     General: Bowel sounds are normal. There is no distension.     Palpations: Abdomen is soft.     Tenderness: There is no abdominal tenderness.  Musculoskeletal:        General: No tenderness. Normal range of motion.     Cervical back: Normal range of motion and neck supple.  Skin:    General: Skin is warm and dry.  Neurological:     Mental Status: She is alert and oriented to person, place, and time.     Cranial Nerves: No cranial nerve deficit.     Deep Tendon Reflexes: Reflexes are normal and symmetric.  Psychiatric:  Behavior: Behavior normal.        Thought Content: Thought content normal.        Judgment: Judgment normal.       BP 123/81    Pulse 96    Temp 97.6 F (36.4 C) (Temporal)    Ht _0  (1.651 m)    Wt 168 lb (76.2 kg)    SpO2 98%    BMI 27.96 kg/m      Assessment & Plan:  Phyllis Parker comes in today with chief complaint of Medical Management of Chronic Issues   Diagnosis and orders addressed:  1.  GAD (generalized anxiety disorder) - ALPRAZolam (XANAX) 1 MG tablet; Take 1 tablet (1 mg total) by mouth 3 (three) times daily as needed for anxiety.  Dispense: 60 tablet; Refill: 5 - CMP14+EGFR - CBC with Differential/Platelet  2. Primary insomnia - zolpidem (AMBIEN) 10 MG tablet; Take 1 tablet (10 mg total) by mouth at bedtime as needed. for sleep  Dispense: 30 tablet; Refill: 5 - CMP14+EGFR - CBC with Differential/Platelet  3. Dizziness - meclizine (ANTIVERT) 25 MG tablet; TAKE (1) TABLET THREE TIMES DAILY AS NEEDED FOR DIZZINESS.  Dispense: 60 tablet; Refill: 0 - CMP14+EGFR - CBC with Differential/Platelet  4. Gastroesophageal reflux disease without esophagitis - CMP14+EGFR - CBC with Differential/Platelet  5. Acquired hypothyroidism - CMP14+EGFR - CBC with Differential/Platelet - TSH  6. Moderate episode of recurrent major depressive disorder (HCC) - CMP14+EGFR - CBC with Differential/Platelet  7. Encounter for screening for HIV - CMP14+EGFR - CBC with Differential/Platelet - HIV Antibody (routine testing w rflx)  8. Need for hepatitis C screening test - CMP14+EGFR - CBC with Differential/Platelet - Hepatitis C antibody  9. Colon cancer screening - CMP14+EGFR - CBC with Differential/Platelet - Ambulatory referral to Gastroenterology   Labs pending Patient reviewed in [place] controlled database, no flags noted. Contract and drug screen are up to date.  Health Maintenance reviewed Diet and exercise encouraged  Follow up plan: 4 months    Evelina Dun, FNP

## 2019-11-11 LAB — CMP14+EGFR
ALT: 14 IU/L (ref 0–32)
AST: 21 IU/L (ref 0–40)
Albumin/Globulin Ratio: 2 (ref 1.2–2.2)
Albumin: 4.9 g/dL (ref 3.8–4.9)
Alkaline Phosphatase: 108 IU/L (ref 44–121)
BUN/Creatinine Ratio: 13 (ref 9–23)
BUN: 12 mg/dL (ref 6–24)
Bilirubin Total: 0.2 mg/dL (ref 0.0–1.2)
CO2: 24 mmol/L (ref 20–29)
Calcium: 9.7 mg/dL (ref 8.7–10.2)
Chloride: 103 mmol/L (ref 96–106)
Creatinine, Ser: 0.9 mg/dL (ref 0.57–1.00)
GFR calc Af Amer: 82 mL/min/{1.73_m2} (ref >59)
GFR calc non Af Amer: 71 mL/min/{1.73_m2} (ref >59)
Globulin, Total: 2.5 g/dL (ref 1.5–4.5)
Glucose: 78 mg/dL (ref 65–99)
Potassium: 4.4 mmol/L (ref 3.5–5.2)
Sodium: 142 mmol/L (ref 134–144)
Total Protein: 7.4 g/dL (ref 6.0–8.5)

## 2019-11-11 LAB — CBC WITH DIFFERENTIAL/PLATELET
Basophils Absolute: 0 10*3/uL (ref 0.0–0.2)
Basos: 1 %
EOS (ABSOLUTE): 0.1 10*3/uL (ref 0.0–0.4)
Eos: 2 %
Hematocrit: 37.7 % (ref 34.0–46.6)
Hemoglobin: 12.8 g/dL (ref 11.1–15.9)
Immature Grans (Abs): 0 10*3/uL (ref 0.0–0.1)
Immature Granulocytes: 0 %
Lymphocytes Absolute: 1.9 10*3/uL (ref 0.7–3.1)
Lymphs: 37 %
MCH: 30.7 pg (ref 26.6–33.0)
MCHC: 34 g/dL (ref 31.5–35.7)
MCV: 90 fL (ref 79–97)
Monocytes Absolute: 0.3 10*3/uL (ref 0.1–0.9)
Monocytes: 6 %
Neutrophils Absolute: 2.8 10*3/uL (ref 1.4–7.0)
Neutrophils: 54 %
Platelets: 280 10*3/uL (ref 150–450)
RBC: 4.17 x10E6/uL (ref 3.77–5.28)
RDW: 12.1 % (ref 11.7–15.4)
WBC: 5.2 10*3/uL (ref 3.4–10.8)

## 2019-11-11 LAB — TSH: TSH: 0.05 u[IU]/mL — ABNORMAL LOW (ref 0.450–4.500)

## 2019-11-11 LAB — HIV ANTIBODY (ROUTINE TESTING W REFLEX): HIV Screen 4th Generation wRfx: NONREACTIVE

## 2019-11-11 LAB — HEPATITIS C ANTIBODY: Hep C Virus Ab: <0.1 s/co ratio (ref 0.0–0.9)

## 2019-11-12 ENCOUNTER — Other Ambulatory Visit: Payer: Self-pay | Admitting: Family

## 2019-11-12 MED ORDER — LEVOTHYROXINE SODIUM 137 MCG PO TABS: 137.0000 ug | ORAL_TABLET | Freq: Every day | ORAL | 3 refills | Status: DC

## 2019-11-17 ENCOUNTER — Encounter: Payer: Self-pay | Admitting: Internal Medicine

## 2019-12-08 ENCOUNTER — Other Ambulatory Visit: Payer: Self-pay | Admitting: Family

## 2019-12-08 ENCOUNTER — Other Ambulatory Visit: Payer: Self-pay | Admitting: Nurse Practitioner

## 2019-12-08 DIAGNOSIS — F411 Generalized anxiety disorder: Secondary | ICD-10-CM

## 2019-12-08 DIAGNOSIS — R42 Dizziness and giddiness: Secondary | ICD-10-CM

## 2019-12-14 ENCOUNTER — Other Ambulatory Visit: Payer: Self-pay | Admitting: *Deleted

## 2019-12-14 MED ORDER — BUSPIRONE HCL 15 MG PO TABS
15.0000 mg | ORAL_TABLET | Freq: Three times a day (TID) | ORAL | 2 refills | Status: DC
Start: 2019-12-14 — End: 2021-12-08

## 2019-12-25 ENCOUNTER — Ambulatory Visit: Payer: 59 | Admitting: Gastroenterology

## 2020-01-13 ENCOUNTER — Other Ambulatory Visit: Payer: Self-pay | Admitting: Family

## 2020-01-13 DIAGNOSIS — R42 Dizziness and giddiness: Secondary | ICD-10-CM

## 2020-02-03 ENCOUNTER — Encounter: Payer: Self-pay | Admitting: Nurse Practitioner

## 2020-02-03 ENCOUNTER — Ambulatory Visit (INDEPENDENT_AMBULATORY_CARE_PROVIDER_SITE_OTHER): Payer: 59 | Admitting: Nurse Practitioner

## 2020-02-03 DIAGNOSIS — J0110 Acute frontal sinusitis, unspecified: Secondary | ICD-10-CM | POA: Diagnosis not present

## 2020-02-03 MED ORDER — PREDNISONE 10 MG (21) PO TBPK: ORAL_TABLET | ORAL | 0 refills | Status: DC

## 2020-02-03 MED ORDER — AZITHROMYCIN 250 MG PO TABS: ORAL_TABLET | ORAL | 0 refills | Status: DC

## 2020-02-03 MED ORDER — DM-GUAIFENESIN ER 30-600 MG PO TB12: 1.0000 | ORAL_TABLET | Freq: Two times a day (BID) | ORAL | 0 refills | Status: DC

## 2020-02-03 NOTE — Assessment & Plan Note (Signed)
Subacute frontal sinusitis symptoms not well controlled.  Patient reports symptoms of wheezing, chest congestion, cough, nausea, and shortness of breath in the last 4 to 6 days.  Patient has had 2 negative  home COVID-19 swab, and 1 negative pharmacy COVID-19 test. Started patient on azithromycin, encourage hydration, guaifenesin for cough and congestion, Tylenol for headache.  Follow-up with worsening or unresolved symptoms.  Rx sent to pharmacy.

## 2020-02-03 NOTE — Patient Instructions (Signed)
Sinusitis, Adult Sinusitis is inflammation of your sinuses. Sinuses are hollow spaces in the bones around your face. Your sinuses are located:  Around your eyes.  In the middle of your forehead.  Behind your nose.  In your cheekbones. Mucus normally drains out of your sinuses. When your nasal tissues become inflamed or swollen, mucus can become trapped or blocked. This allows bacteria, viruses, and fungi to grow, which leads to infection. Most infections of the sinuses are caused by a virus. Sinusitis can develop quickly. It can last for up to 4 weeks (acute) or for more than 12 weeks (chronic). Sinusitis often develops after a cold. What are the causes? This condition is caused by anything that creates swelling in the sinuses or stops mucus from draining. This includes:  Allergies.  Asthma.  Infection from bacteria or viruses.  Deformities or blockages in your nose or sinuses.  Abnormal growths in the nose (nasal polyps).  Pollutants, such as chemicals or irritants in the air.  Infection from fungi (rare). What increases the risk? You are more likely to develop this condition if you:  Have a weak body defense system (immune system).  Do a lot of swimming or diving.  Overuse nasal sprays.  Smoke. What are the signs or symptoms? The main symptoms of this condition are pain and a feeling of pressure around the affected sinuses. Other symptoms include:  Stuffy nose or congestion.  Thick drainage from your nose.  Swelling and warmth over the affected sinuses.  Headache.  Upper toothache.  A cough that may get worse at night.  Extra mucus that collects in the throat or the back of the nose (postnasal drip).  Decreased sense of smell and taste.  Fatigue.  A fever.  Sore throat.  Bad breath. How is this diagnosed? This condition is diagnosed based on:  Your symptoms.  Your medical history.  A physical exam.  Tests to find out if your condition is  acute or chronic. This may include: ? Checking your nose for nasal polyps. ? Viewing your sinuses using a device that has a light (endoscope). ? Testing for allergies or bacteria. ? Imaging tests, such as an MRI or CT scan. In rare cases, a bone biopsy may be done to rule out more serious types of fungal sinus disease. How is this treated? Treatment for sinusitis depends on the cause and whether your condition is chronic or acute.  If caused by a virus, your symptoms should go away on their own within 10 days. You may be given medicines to relieve symptoms. They include: ? Medicines that shrink swollen nasal passages (topical intranasal decongestants). ? Medicines that treat allergies (antihistamines). ? A spray that eases inflammation of the nostrils (topical intranasal corticosteroids). ? Rinses that help get rid of thick mucus in your nose (nasal saline washes).  If caused by bacteria, your health care provider may recommend waiting to see if your symptoms improve. Most bacterial infections will get better without antibiotic medicine. You may be given antibiotics if you have: ? A severe infection. ? A weak immune system.  If caused by narrow nasal passages or nasal polyps, you may need to have surgery. Follow these instructions at home: Medicines  Take, use, or apply over-the-counter and prescription medicines only as told by your health care provider. These may include nasal sprays.  If you were prescribed an antibiotic medicine, take it as told by your health care provider. Do not stop taking the antibiotic even if you start   to feel better. Hydrate and humidify  Drink enough fluid to keep your urine pale yellow. Staying hydrated will help to thin your mucus.  Use a cool mist humidifier to keep the humidity level in your home above 50%.  Inhale steam for 10-15 minutes, 3-4 times a day, or as told by your health care provider. You can do this in the bathroom while a hot shower is  running.  Limit your exposure to cool or dry air.   Rest  Rest as much as possible.  Sleep with your head raised (elevated).  Make sure you get enough sleep each night. General instructions  Apply a warm, moist washcloth to your face 3-4 times a day or as told by your health care provider. This will help with discomfort.  Wash your hands often with soap and water to reduce your exposure to germs. If soap and water are not available, use hand sanitizer.  Do not smoke. Avoid being around people who are smoking (secondhand smoke).  Keep all follow-up visits as told by your health care provider. This is important.   Contact a health care provider if:  You have a fever.  Your symptoms get worse.  Your symptoms do not improve within 10 days. Get help right away if:  You have a severe headache.  You have persistent vomiting.  You have severe pain or swelling around your face or eyes.  You have vision problems.  You develop confusion.  Your neck is stiff.  You have trouble breathing. Summary  Sinusitis is soreness and inflammation of your sinuses. Sinuses are hollow spaces in the bones around your face.  This condition is caused by nasal tissues that become inflamed or swollen. The swelling traps or blocks the flow of mucus. This allows bacteria, viruses, and fungi to grow, which leads to infection.  If you were prescribed an antibiotic medicine, take it as told by your health care provider. Do not stop taking the antibiotic even if you start to feel better.  Keep all follow-up visits as told by your health care provider. This is important. This information is not intended to replace advice given to you by your health care provider. Make sure you discuss any questions you have with your health care provider. Document Revised: 05/27/2017 Document Reviewed: 05/27/2017 Elsevier Patient Education  2021 [employer] Inc.  

## 2020-02-03 NOTE — Progress Notes (Signed)
   Virtual Visit via telephone Note Due to COVID-19 pandemic this visit was conducted virtually. This visit type was conducted due to national recommendations for restrictions regarding the COVID-19 Pandemic (e.g. social distancing, sheltering in place) in an effort to limit this patient's exposure and mitigate transmission in our community. All issues noted in this document were discussed and addressed.  A physical exam was not performed with this format.  I connected with Phyllis Parker on 02/03/20 at 02:30pm  by telephone and verified that I am speaking with the correct person using two identifiers. Phyllis Parker is currently located at home during visit. The provider, Daryll Drown, NP is located in their office at time of visit.  I discussed the limitations, risks, security and privacy concerns of performing an evaluation and management service by telephone and the availability of in person appointments. I also discussed with the patient that there may be a patient responsible charge related to this service. The patient expressed understanding and agreed to proceed.   History and Present Illness:  Sinusitis This is a recurrent problem. The current episode started in the past 7 days. The problem is unchanged. The maximum temperature recorded prior to her arrival was 100.4 - 100.9 F. The fever has been present for 1 to 2 days. The pain is moderate. Associated symptoms include chills, congestion, coughing, ear pain, headaches, shortness of breath and sinus pressure. Past treatments include nothing.      Review of Systems  Constitutional: Positive for chills and fever.  HENT: Positive for congestion, ear pain and sinus pressure.   Respiratory: Positive for cough and shortness of breath.   Cardiovascular: Negative.   Gastrointestinal: Positive for nausea.  Genitourinary: Negative.   Musculoskeletal: Negative.   Neurological: Positive for headaches.  All other systems reviewed and are  negative.    Observations/Objective:  Tele- Visit  Assessment and Plan:  Subacute frontal sinusitis Subacute frontal sinusitis symptoms not well controlled.  Patient reports symptoms of wheezing, chest congestion, cough, nausea, and shortness of breath in the last 4 to 6 days.  Patient has had 2 negative  home COVID-19 swab, and 1 negative pharmacy COVID-19 test. Started patient on azithromycin, encourage hydration, guaifenesin for cough and congestion, Tylenol for headache.  Follow-up with worsening or unresolved symptoms.  Rx sent to pharmacy.       Follow Up Instructions:  Follow up with worsening or unresolved symptoms    I discussed the assessment and treatment plan with the patient. The patient was provided an opportunity to ask questions and all were answered. The patient agreed with the plan and demonstrated an understanding of the instructions.   The patient was advised to call back or seek an in-person evaluation if the symptoms worsen or if the condition fails to improve as anticipated.  The above assessment and management plan was discussed with the patient. The patient verbalized understanding of and has agreed to the management plan. Patient is aware to call the clinic if symptoms persist or worsen. Patient is aware when to return to the clinic for a follow-up visit. Patient educated on when it is appropriate to go to the emergency department.   Time call ended: 02:41 pm   I provided  11 minutes of non-face-to-face time during this encounter.    Daryll Drown, NP

## 2020-03-17 ENCOUNTER — Ambulatory Visit: Payer: 59 | Admitting: Family

## 2020-03-17 ENCOUNTER — Other Ambulatory Visit: Payer: Self-pay

## 2020-03-17 ENCOUNTER — Telehealth: Payer: Self-pay | Admitting: *Deleted

## 2020-03-17 ENCOUNTER — Encounter: Payer: Self-pay | Admitting: Family

## 2020-03-17 VITALS — BP 143/77 | HR 98 | Temp 98.0°F | Ht 65.0 in | Wt 164.2 lb

## 2020-03-17 DIAGNOSIS — F331 Major depressive disorder, recurrent, moderate: Secondary | ICD-10-CM | POA: Diagnosis not present

## 2020-03-17 DIAGNOSIS — K219 Gastro-esophageal reflux disease without esophagitis: Secondary | ICD-10-CM | POA: Diagnosis not present

## 2020-03-17 DIAGNOSIS — F4322 Adjustment disorder with anxiety: Secondary | ICD-10-CM | POA: Diagnosis not present

## 2020-03-17 DIAGNOSIS — F5101 Primary insomnia: Secondary | ICD-10-CM

## 2020-03-17 DIAGNOSIS — E039 Hypothyroidism, unspecified: Secondary | ICD-10-CM | POA: Diagnosis not present

## 2020-03-17 DIAGNOSIS — F5105 Insomnia due to other mental disorder: Secondary | ICD-10-CM

## 2020-03-17 DIAGNOSIS — F99 Mental disorder, not otherwise specified: Secondary | ICD-10-CM

## 2020-03-17 DIAGNOSIS — F411 Generalized anxiety disorder: Secondary | ICD-10-CM

## 2020-03-17 MED ORDER — VORTIOXETINE HBR 10 MG PO TABS: ORAL_TABLET | ORAL | 0 refills | Status: AC

## 2020-03-17 MED ORDER — SERTRALINE HCL 100 MG PO TABS: 100.0000 mg | ORAL_TABLET | Freq: Every day | ORAL | 1 refills | Status: DC

## 2020-03-17 MED ORDER — ALPRAZOLAM 1 MG PO TABS: 1.0000 mg | ORAL_TABLET | Freq: Three times a day (TID) | ORAL | 5 refills | Status: DC | PRN

## 2020-03-17 MED ORDER — VORTIOXETINE HBR 20 MG PO TABS: 20.0000 mg | ORAL_TABLET | Freq: Every day | ORAL | 2 refills | Status: DC

## 2020-03-17 NOTE — Telephone Encounter (Signed)
Tritellix 10 mg rejected from plan   These are preference from plan: Citalopram Hydrobromide Not Required DULoxetine HCl Not Required Escitalopram Oxalate Not Required FLUoxetine HCl Not Required PARoxetine HCl Not Required Sertraline HCl Not Required Venlafaxine HCl ER Not Required Viibryd Not Required Viibryd Starter Pack Not Required   If you prefer - we can attempt PA

## 2020-03-17 NOTE — Patient Instructions (Signed)
[email or web address]">  Panic Attack A panic attack is a sudden episode of severe anxiety, fear, or discomfort that causes physical and emotional symptoms. The attack may be in response to something frightening, or it may occur for no known reason. Symptoms of a panic attack can be similar to symptoms of a heart attack or stroke. It is important to see your health care provider when you have a panic attack so that these conditions can be ruled out. A panic attack is a symptom of another condition. Most panic attacks go away with treatment of the underlying problem. If you have panic attacks often, you may have a condition called panic disorder. What are the causes? A panic attack may be caused by:  An extreme, life-threatening situation, such as a war or natural disaster.  An anxiety disorder, such as post-traumatic stress disorder.  Depression.  Certain medical conditions, including heart problems, neurological conditions, and infections.  Certain over-the-counter and prescription medicines.  Illegal drugs that increase heart rate and blood pressure, such as methamphetamine.  Alcohol.  Supplements that increase anxiety.  Panic disorder. What increases the risk? You are more likely to develop this condition if:  You have an anxiety disorder.  You have another mental health condition.  You take certain medicines.  You use alcohol, illegal drugs, or other substances.  You are under extreme stress.  A life event is causing increased feelings of anxiety and depression. What are the signs or symptoms? A panic attack starts suddenly, usually lasts about 20 minutes, and occurs with one or more of the following:  A pounding heart.  A feeling that your heart is beating irregularly or faster than normal (palpitations).  Sweating.  Trembling or shaking.  Shortness of breath or feeling smothered.  Feeling choked.  Chest pain or  discomfort.  Nausea or a strange feeling in your stomach.  Dizziness, feeling lightheaded, or feeling like you might faint.  Chills or hot flashes.  Numbness or tingling in your lips, hands, or feet.  Feeling confused, or feeling that you are not yourself.  Fear of losing control or being emotionally unstable.  Fear of dying. How is this diagnosed? A panic attack is diagnosed with an assessment by your health care provider. During the assessment your health care provider will ask questions about:  Your history of anxiety, depression, and panic attacks.  Your medical history.  Whether you drink alcohol, use illegal drugs, take supplements, or take medicines. Be honest about your substance use. Your health care provider may also:  Order blood tests or other kinds of tests to rule out serious medical conditions.  Refer you to a mental health professional for further evaluation.   How is this treated? Treatment depends on the cause of the panic attack:  If the cause is a medical problem, your health care provider will either treat that problem or refer you to a specialist.  If the cause is emotional, you may be given anti-anxiety medicines or referred to a counselor. These medicines may reduce how often attacks happen, reduce how severe the attacks are, and lower anxiety.  If the cause is a medicine, your health care provider may tell you to stop the medicine, change your dose, or take a different medicine.  If the cause is a drug, treatment may involve letting the drug wear off and taking medicine to help the drug leave your body or to counteract its effects. Attacks caused by drug abuse may continue even if you stop using the  drug. Follow these instructions at home:  Take over-the-counter and prescription medicines only as told by your health care provider.  If you feel anxious, limit your caffeine intake.  Take good care of your physical and mental health by: ? Eating a  balanced diet that includes plenty of fresh fruits and vegetables, whole grains, lean meats, and low-fat dairy. ? Getting plenty of rest. Try to get 7-8 hours of uninterrupted sleep each night. ? Exercising regularly. Try to get 30 minutes of physical activity at least 5 days a week. ? Not smoking. Talk to your health care provider if you need help quitting. ? Limiting alcohol intake to no more than 1 drink a day for nonpregnant women and 2 drinks a day for men. One drink equals 12 oz of beer, 5 oz of wine, or 1 oz of hard liquor.  Keep all follow-up visits as told by your health care provider. This is important. Panic attacks may have underlying physical or emotional problems that take time to accurately diagnose. Contact a health care provider if:  Your symptoms do not improve, or they get worse.  You are not able to take your medicine as prescribed because of side effects. Get help right away if:  You have serious thoughts about hurting yourself or others.  You have symptoms of a panic attack. Do not drive yourself to the hospital. Have someone else drive you or call an ambulance. If you ever feel like you may hurt yourself or others, or you have thoughts about taking your own life, get help right away. You can go to your nearest emergency department or call:  Your local emergency services (911 in the [place]  A suicide crisis helpline, such as the National Suicide Prevention Lifeline at (307)388-6927. This is open 24 hours a day. Summary  A panic attack is a sign of a serious health or mental health condition. Get help right away. Do not drive yourself to the hospital. Have someone else drive you or call an ambulance.  Always see a health care provider to have the reasons for the panic attack correctly diagnosed.  If your panic attack was caused by a physical problem, follow your health care provider's suggestions for medicine, referral to a specialist, and lifestyle changes.  If  your panic attack was caused by an emotional problem, follow through with counseling from a qualified mental health specialist.  If you feel like you may hurt yourself or others, call 911 and get help right away. This information is not intended to replace advice given to you by your health care provider. Make sure you discuss any questions you have with your health care provider. Document Revised: 06/25/2019 Document Reviewed: 06/25/2019 Elsevier Patient Education  2021 [employer].

## 2020-03-17 NOTE — Progress Notes (Signed)
Subjective:    Patient ID: Phyllis Parker, female    DOB: [birth year], 60 y.o.   MRN: [id number]  Chief Complaint  Patient presents with  . Medical Management of Chronic Issues  . Night Sweats    But happens all through the day.    PT presents to the office today for chronic follow up. Pt very anxious today and crying. She states her husband was out of work for three months and struggling keeping their health insurance.  Gastroesophageal Reflux She complains of belching, heartburn, a hoarse voice and nausea. This is a chronic problem. The current episode started more than 1 year ago. The problem occurs occasionally. Associated symptoms include fatigue. She has tried a PPI for the symptoms. The treatment provided moderate relief.  Thyroid Problem Presents for follow-up visit. Symptoms include anxiety, depressed mood, fatigue, heat intolerance, hoarse voice and palpitations. The symptoms have been stable.  Insomnia Primary symptoms: difficulty falling asleep.  The current episode started more than one year. The onset quality is gradual. The problem occurs intermittently. PMH includes: depression.  Anxiety Presents for follow-up visit. Symptoms include depressed mood, excessive worry, insomnia, irritability, nausea, nervous/anxious behavior, palpitations and restlessness. Symptoms occur most days. The severity of symptoms is moderate.    Depression        This is a chronic problem.  The current episode started more than 1 year ago.   The onset quality is gradual.   The problem occurs intermittently.  Associated symptoms include fatigue, insomnia, irritable, restlessness and sad.  Past treatments include SSRIs - Selective serotonin reuptake inhibitors.  Compliance with treatment is good.  Past medical history includes thyroid problem and anxiety.       Review of Systems  Constitutional: Positive for fatigue and irritability.  HENT: Positive for hoarse voice.   Cardiovascular: Positive for  palpitations.  Gastrointestinal: Positive for heartburn and nausea.  Endocrine: Positive for heat intolerance.  Psychiatric/Behavioral: Positive for depression. The patient is nervous/anxious and has insomnia.   All other systems reviewed and are negative.      Objective:   Physical Exam Vitals reviewed.  Constitutional:      General: She is irritable. She is not in acute distress.    Appearance: She is well-developed. She is obese.  HENT:     Head: Normocephalic and atraumatic.     Right Ear: Tympanic membrane normal.     Left Ear: Tympanic membrane normal.  Eyes:     Pupils: Pupils are equal, round, and reactive to light.  Neck:     Thyroid: No thyromegaly.  Cardiovascular:     Rate and Rhythm: Normal rate and regular rhythm.     Heart sounds: Normal heart sounds. No murmur heard.   Pulmonary:     Effort: Pulmonary effort is normal. No respiratory distress.     Breath sounds: Normal breath sounds. No wheezing.  Abdominal:     General: Bowel sounds are normal. There is no distension.     Palpations: Abdomen is soft.     Tenderness: There is no abdominal tenderness.  Musculoskeletal:        General: No tenderness. Normal range of motion.     Cervical back: Normal range of motion and neck supple.  Skin:    General: Skin is warm and dry.  Neurological:     Mental Status: She is alert and oriented to person, place, and time.     Cranial Nerves: No cranial nerve deficit.     Deep  Tendon Reflexes: Reflexes are normal and symmetric.  Psychiatric:        Mood and Affect: Mood is anxious and depressed. Affect is tearful.        Behavior: Behavior normal.        Thought Content: Thought content normal.        Judgment: Judgment normal.     Comments: Pt crying and anxious today       BP (!) 143/77   Pulse 98   Temp 98 F (36.7 C) (Temporal)   Ht 5' 5"  (1.651 m)   Wt 164 lb 3.2 oz (74.5 kg)   SpO2 98%   BMI 27.32 kg/m      Assessment & Plan:  Phyllis Parker comes in  today with chief complaint of Medical Management of Chronic Issues and Night Sweats (But happens all through the day.)   Diagnosis and orders addressed:  1. Gastroesophageal reflux disease without esophagitis - CMP14+EGFR - CBC with Differential/Platelet  2. Acquired hypothyroidism - CMP14+EGFR - CBC with Differential/Platelet - TSH  3. Adjustment disorder with anxious mood - CMP14+EGFR - CBC with Differential/Platelet - vortioxetine HBr (TRINTELLIX) 10 MG TABS tablet; Take 1 tablet (10 mg total) by mouth daily for 14 days, THEN 2 tablets (20 mg total) daily for 14 days.  Dispense: 42 tablet; Refill: 0 - vortioxetine HBr (TRINTELLIX) 20 MG TABS tablet; Take 1 tablet (20 mg total) by mouth daily.  Dispense: 90 tablet; Refill: 2 - ALPRAZolam (XANAX) 1 MG tablet; Take 1 tablet (1 mg total) by mouth 3 (three) times daily as needed for anxiety.  Dispense: 60 tablet; Refill: 5 - Ambulatory referral to Psychiatry  4. Moderate episode of recurrent major depressive disorder ([facility]) - CMP14+EGFR - CBC with Differential/Platelet - vortioxetine HBr (TRINTELLIX) 10 MG TABS tablet; Take 1 tablet (10 mg total) by mouth daily for 14 days, THEN 2 tablets (20 mg total) daily for 14 days.  Dispense: 42 tablet; Refill: 0 - vortioxetine HBr (TRINTELLIX) 20 MG TABS tablet; Take 1 tablet (20 mg total) by mouth daily.  Dispense: 90 tablet; Refill: 2 - Ambulatory referral to Psychiatry  5. Insomnia due to other mental disorder - CMP14+EGFR - CBC with Differential/Platelet - Ambulatory referral to Psychiatry  6. GAD (generalized anxiety disorder) - CMP14+EGFR - CBC with Differential/Platelet - vortioxetine HBr (TRINTELLIX) 10 MG TABS tablet; Take 1 tablet (10 mg total) by mouth daily for 14 days, THEN 2 tablets (20 mg total) daily for 14 days.  Dispense: 42 tablet; Refill: 0 - vortioxetine HBr (TRINTELLIX) 20 MG TABS tablet; Take 1 tablet (20 mg total) by mouth daily.  Dispense: 90 tablet; Refill: 2 -  sertraline (ZOLOFT) 100 MG tablet; Take 1 tablet (100 mg total) by mouth daily.  Dispense: 30 tablet; Refill: 1 - ALPRAZolam (XANAX) 1 MG tablet; Take 1 tablet (1 mg total) by mouth 3 (three) times daily as needed for anxiety.  Dispense: 60 tablet; Refill: 5 - Ambulatory referral to Psychiatry  7. Primary insomnia - CMP14+EGFR - CBC with Differential/Platelet - Ambulatory referral to Psychiatry    Will start Tintellix 10 mg today for 2 weeks, then increase to 20 mg. Will decrease Zoloft to 100 mg for next week, then decrease to 50 mg then tamper off.  Labs pending Health Maintenance reviewed Diet and exercise encouraged  Follow up plan: 3 months    Evelina Dun, FNP

## 2020-03-18 ENCOUNTER — Other Ambulatory Visit: Payer: Self-pay | Admitting: Family

## 2020-03-18 LAB — CMP14+EGFR
ALT: 15 IU/L (ref 0–32)
AST: 19 IU/L (ref 0–40)
Albumin/Globulin Ratio: 1.8 (ref 1.2–2.2)
Albumin: 4.6 g/dL (ref 3.8–4.9)
Alkaline Phosphatase: 114 IU/L (ref 44–121)
BUN/Creatinine Ratio: 12 (ref 9–23)
BUN: 11 mg/dL (ref 6–24)
Bilirubin Total: 0.4 mg/dL (ref 0.0–1.2)
CO2: 21 mmol/L (ref 20–29)
Calcium: 9.9 mg/dL (ref 8.7–10.2)
Chloride: 103 mmol/L (ref 96–106)
Creatinine, Ser: 0.92 mg/dL (ref 0.57–1.00)
Globulin, Total: 2.6 g/dL (ref 1.5–4.5)
Glucose: 93 mg/dL (ref 65–99)
Potassium: 4 mmol/L (ref 3.5–5.2)
Sodium: 143 mmol/L (ref 134–144)
Total Protein: 7.2 g/dL (ref 6.0–8.5)
eGFR: 73 mL/min/{1.73_m2} (ref >59)

## 2020-03-18 LAB — CBC WITH DIFFERENTIAL/PLATELET
Basophils Absolute: 0 10*3/uL (ref 0.0–0.2)
Basos: 1 %
EOS (ABSOLUTE): 0.1 10*3/uL (ref 0.0–0.4)
Eos: 3 %
Hematocrit: 35.9 % (ref 34.0–46.6)
Hemoglobin: 12.3 g/dL (ref 11.1–15.9)
Immature Grans (Abs): 0 10*3/uL (ref 0.0–0.1)
Immature Granulocytes: 0 %
Lymphocytes Absolute: 1.7 10*3/uL (ref 0.7–3.1)
Lymphs: 36 %
MCH: 30 pg (ref 26.6–33.0)
MCHC: 34.3 g/dL (ref 31.5–35.7)
MCV: 88 fL (ref 79–97)
Monocytes Absolute: 0.3 10*3/uL (ref 0.1–0.9)
Monocytes: 7 %
Neutrophils Absolute: 2.4 10*3/uL (ref 1.4–7.0)
Neutrophils: 53 %
Platelets: 279 10*3/uL (ref 150–450)
RBC: 4.1 x10E6/uL (ref 3.77–5.28)
RDW: 12 % (ref 11.7–15.4)
WBC: 4.6 10*3/uL (ref 3.4–10.8)

## 2020-03-18 LAB — TSH: TSH: 0.032 u[IU]/mL — ABNORMAL LOW (ref 0.450–4.500)

## 2020-03-18 MED ORDER — LEVOTHYROXINE SODIUM 125 MCG PO TABS: 125.0000 ug | ORAL_TABLET | Freq: Every day | ORAL | 3 refills | Status: DC

## 2020-03-18 NOTE — Telephone Encounter (Signed)
Can we try the PA. She has tried Cymbalta in the past and could not tolerate it. Thanks!

## 2020-03-21 NOTE — Telephone Encounter (Signed)
Approved today Request Reference Number: [id number]. TRINTELLIX TAB 10MG  is approved through 03/21/2021. Your patient may now fill this prescription and it will be covered

## 2020-03-21 NOTE — Telephone Encounter (Signed)
[facility] aware  

## 2020-03-21 NOTE — Telephone Encounter (Signed)
Key: [id number] Trintellix (formerly Brintellix) 10MG  tablets  Sent to plan

## 2020-05-09 ENCOUNTER — Other Ambulatory Visit: Payer: Self-pay | Admitting: Family

## 2020-05-09 DIAGNOSIS — F5101 Primary insomnia: Secondary | ICD-10-CM

## 2020-05-10 ENCOUNTER — Telehealth: Payer: Self-pay

## 2020-05-10 ENCOUNTER — Encounter: Payer: Self-pay | Admitting: Family Medicine

## 2020-05-10 DIAGNOSIS — F5101 Primary insomnia: Secondary | ICD-10-CM

## 2020-05-10 NOTE — Telephone Encounter (Signed)
  Prescription Request  05/10/2020  What is the name of the medication or equipment? Zolpidem 10 mg. Patient was seen in March with Neysa Bonito and has upcoming appt 6-1 and needs her refill on Ambien. Thought it was going to be done at her last appt in March, Was refused  Have you contacted your pharmacy to request a refill? (if applicable) Yes  Which pharmacy would you like this sent to? [facility]   Patient notified that their request is being sent to the clinical staff for review and that they should receive a response within 2 business days.

## 2020-05-11 MED ORDER — ZOLPIDEM TARTRATE 10 MG PO TABS: 10.0000 mg | ORAL_TABLET | Freq: Every evening | ORAL | 5 refills | Status: DC | PRN

## 2020-05-11 NOTE — Telephone Encounter (Signed)
Prescription sent to pharmacy.

## 2020-05-17 ENCOUNTER — Encounter: Payer: Self-pay | Admitting: Family Medicine

## 2020-05-24 ENCOUNTER — Other Ambulatory Visit: Payer: Self-pay | Admitting: Family

## 2020-05-25 ENCOUNTER — Encounter: Payer: Self-pay | Admitting: Family Medicine

## 2020-05-30 ENCOUNTER — Telehealth: Payer: Self-pay | Admitting: Family

## 2020-05-30 DIAGNOSIS — F411 Generalized anxiety disorder: Secondary | ICD-10-CM

## 2020-05-30 DIAGNOSIS — K219 Gastro-esophageal reflux disease without esophagitis: Secondary | ICD-10-CM

## 2020-05-30 DIAGNOSIS — E039 Hypothyroidism, unspecified: Secondary | ICD-10-CM

## 2020-05-30 DIAGNOSIS — Z13220 Encounter for screening for lipoid disorders: Secondary | ICD-10-CM

## 2020-05-30 DIAGNOSIS — F331 Major depressive disorder, recurrent, moderate: Secondary | ICD-10-CM

## 2020-05-30 NOTE — Telephone Encounter (Signed)
Pt would like labs added before appt on 5/27

## 2020-05-30 NOTE — Telephone Encounter (Signed)
Labs ordered.

## 2020-06-03 ENCOUNTER — Ambulatory Visit: Payer: 59 | Admitting: Family

## 2020-06-03 ENCOUNTER — Encounter: Payer: Self-pay | Admitting: Family

## 2020-06-03 ENCOUNTER — Other Ambulatory Visit: Payer: Self-pay

## 2020-06-03 VITALS — BP 139/82 | HR 76 | Temp 96.8°F | Ht 65.0 in | Wt 161.0 lb

## 2020-06-03 DIAGNOSIS — F331 Major depressive disorder, recurrent, moderate: Secondary | ICD-10-CM | POA: Diagnosis not present

## 2020-06-03 DIAGNOSIS — F5101 Primary insomnia: Secondary | ICD-10-CM

## 2020-06-03 DIAGNOSIS — R5383 Other fatigue: Secondary | ICD-10-CM

## 2020-06-03 DIAGNOSIS — F411 Generalized anxiety disorder: Secondary | ICD-10-CM | POA: Diagnosis not present

## 2020-06-03 DIAGNOSIS — K219 Gastro-esophageal reflux disease without esophagitis: Secondary | ICD-10-CM

## 2020-06-03 DIAGNOSIS — E039 Hypothyroidism, unspecified: Secondary | ICD-10-CM

## 2020-06-03 DIAGNOSIS — F4322 Adjustment disorder with anxiety: Secondary | ICD-10-CM

## 2020-06-03 MED ORDER — ALPRAZOLAM 1 MG PO TABS: 1.0000 mg | ORAL_TABLET | Freq: Three times a day (TID) | ORAL | 2 refills | Status: DC | PRN

## 2020-06-03 MED ORDER — ZOLPIDEM TARTRATE 10 MG PO TABS: 10.0000 mg | ORAL_TABLET | Freq: Every evening | ORAL | 5 refills | Status: DC | PRN

## 2020-06-03 NOTE — Progress Notes (Signed)
Subjective:    Patient ID: Phyllis Parker, female    DOB: February 19, 1960, 60 y.o.   MRN: [id number]  Chief Complaint  Patient presents with  . Medical Management of Chronic Issues    PT presents to the office today for chronic follow up.Pt very anxious today and crying.  Gastroesophageal Reflux She complains of belching and heartburn. This is a chronic problem. The current episode started more than 1 year ago. The problem occurs occasionally. The problem has been waxing and waning. The symptoms are aggravated by certain foods. Associated symptoms include fatigue. She has tried a PPI for the symptoms. The treatment provided moderate relief.  Thyroid Problem Presents for follow-up visit. Symptoms include anxiety, depressed mood and fatigue. Patient reports no constipation, diaphoresis, diarrhea or leg swelling. The symptoms have been stable.  Insomnia Primary symptoms: difficulty falling asleep, frequent awakening, premature morning awakening.  The current episode started more than one year. The onset quality is gradual. The problem occurs intermittently. PMH includes: depression.  Anxiety Presents for follow-up visit. Symptoms include depressed mood, dry mouth, insomnia, irritability, nervous/anxious behavior and restlessness. Symptoms occur most days. The severity of symptoms is moderate. The quality of sleep is good.    Depression        This is a chronic problem.  The current episode started more than 1 year ago.   The onset quality is gradual.   The problem occurs intermittently.  Associated symptoms include fatigue, helplessness, hopelessness, insomnia, irritable, restlessness and sad.  Past medical history includes thyroid problem and anxiety.       Review of Systems  Constitutional: Positive for fatigue and irritability. Negative for diaphoresis.  Gastrointestinal: Positive for heartburn. Negative for constipation and diarrhea.  Psychiatric/Behavioral: Positive for depression. The  patient is nervous/anxious and has insomnia.   All other systems reviewed and are negative.      Objective:   Physical Exam Vitals reviewed.  Constitutional:      General: She is irritable. She is not in acute distress.    Appearance: She is well-developed.  HENT:     Head: Normocephalic and atraumatic.     Right Ear: Tympanic membrane normal.     Left Ear: Tympanic membrane normal.  Eyes:     Pupils: Pupils are equal, round, and reactive to light.  Neck:     Thyroid: No thyromegaly.  Cardiovascular:     Rate and Rhythm: Normal rate and regular rhythm.     Heart sounds: Normal heart sounds. No murmur heard.   Pulmonary:     Effort: Pulmonary effort is normal. No respiratory distress.     Breath sounds: Normal breath sounds. No wheezing.  Abdominal:     General: Bowel sounds are normal. There is no distension.     Palpations: Abdomen is soft.     Tenderness: There is no abdominal tenderness.  Musculoskeletal:        General: No tenderness. Normal range of motion.     Cervical back: Normal range of motion and neck supple.  Skin:    General: Skin is warm and dry.  Neurological:     Mental Status: She is alert and oriented to person, place, and time.     Cranial Nerves: No cranial nerve deficit.     Deep Tendon Reflexes: Reflexes are normal and symmetric.  Psychiatric:        Mood and Affect: Mood is anxious. Affect is tearful.        Behavior: Behavior normal.  Thought Content: Thought content normal.        Judgment: Judgment normal.       BP 139/82   Pulse 76   Temp (!) 96.8 F (36 C) (Temporal)   Ht 5' 5"  (1.651 m)   Wt 161 lb (73 kg)   BMI 26.79 kg/m      Assessment & Plan:  Drena Ham comes in today with chief complaint of Medical Management of Chronic Issues   Diagnosis and orders addressed:  1. Gastroesophageal reflux disease without esophagitis - CMP14+EGFR  2. Acquired hypothyroidism - CMP14+EGFR - TSH  3. GAD (generalized anxiety  disorder) Referral pending Pt will restart Trintellix, but start at 5 mg for several weeks then increase 10 mg.  - Ambulatory referral to Psychiatry - ALPRAZolam (XANAX) 1 MG tablet; Take 1 tablet (1 mg total) by mouth 3 (three) times daily as needed for anxiety.  Dispense: 60 tablet; Refill: 2 - CMP14+EGFR  4. Moderate episode of recurrent major depressive disorder ([place]) - Ambulatory referral to Psychiatry - CMP14+EGFR  5. Fatigue, unspecified type - Anemia Profile B - CMP14+EGFR  6. Primary insomnia - Ambulatory referral to Psychiatry - zolpidem (AMBIEN) 10 MG tablet; Take 1 tablet (10 mg total) by mouth at bedtime as needed. for sleep  Dispense: 30 tablet; Refill: 5 - CMP14+EGFR  7. Adjustment disorder with anxious mood - ALPRAZolam (XANAX) 1 MG tablet; Take 1 tablet (1 mg total) by mouth 3 (three) times daily as needed for anxiety.  Dispense: 60 tablet; Refill: 2 - CMP14+EGFR   Labs pending Health Maintenance reviewed Diet and exercise encouraged  Follow up plan: 3 months    Evelina Dun, FNP

## 2020-06-03 NOTE — Patient Instructions (Signed)
[email or web address]">  Generalized Anxiety Disorder, Adult Generalized anxiety disorder (GAD) is a mental health condition. Unlike normal worries, anxiety related to GAD is not triggered by a specific event. These worries do not fade or get better with time. GAD interferes with relationships, work, and school. GAD symptoms can vary from mild to severe. People with severe GAD can have intense waves of anxiety with physical symptoms that are similar to panic attacks. What are the causes? The exact cause of GAD is not known, but the following are believed to have an impact:  Differences in natural brain chemicals.  Genes passed down from parents to children.  Differences in the way threats are perceived.  Development during childhood.  Personality. What increases the risk? The following factors may make you more likely to develop this condition:  Being female.  Having a family history of anxiety disorders.  Being very shy.  Experiencing very stressful life events, such as the death of a loved one.  Having a very stressful family environment. What are the signs or symptoms? People with GAD often worry excessively about many things in their lives, such as their health and family. Symptoms may also include:  Mental and emotional symptoms: ? Worrying excessively about natural disasters. ? Fear of being late. ? Difficulty concentrating. ? Fears that others are judging your performance.  Physical symptoms: ? Fatigue. ? Headaches, muscle tension, muscle twitches, trembling, or feeling shaky. ? Feeling like your heart is pounding or beating very fast. ? Feeling out of breath or like you cannot take a deep breath. ? Having trouble falling asleep or staying asleep, or experiencing restlessness. ? Sweating. ? Nausea, diarrhea, or irritable bowel syndrome (IBS).  Behavioral symptoms: ? Experiencing erratic moods or irritability. ? Avoidance of new situations. ? Avoidance of  people. ? Extreme difficulty making decisions. How is this diagnosed? This condition is diagnosed based on your symptoms and medical history. You will also have a physical exam. Your health care provider may perform tests to rule out other possible causes of your symptoms. To be diagnosed with GAD, a person must have anxiety that:  Is out of his or her control.  Affects several different aspects of his or her life, such as work and relationships.  Causes distress that makes him or her unable to take part in normal activities.  Includes at least three symptoms of GAD, such as restlessness, fatigue, trouble concentrating, irritability, muscle tension, or sleep problems. Before your health care provider can confirm a diagnosis of GAD, these symptoms must be present more days than they are not, and they must last for 6 months or longer. How is this treated? This condition may be treated with:  Medicine. Antidepressant medicine is usually prescribed for long-term daily control. Anti-anxiety medicines may be added in severe cases, especially when panic attacks occur.  Talk therapy (psychotherapy). Certain types of talk therapy can be helpful in treating GAD by providing support, education, and guidance. Options include: ? Cognitive behavioral therapy (CBT). People learn coping skills and self-calming techniques to ease their physical symptoms. They learn to identify unrealistic thoughts and behaviors and to replace them with more appropriate thoughts and behaviors. ? Acceptance and commitment therapy (ACT). This treatment teaches people how to be mindful as a way to cope with unwanted thoughts and feelings. ? Biofeedback. This process trains you to manage your body's response (physiological response) through breathing techniques and relaxation methods. You will work with a therapist while machines are used to monitor your physical   symptoms.  Stress management techniques. These include yoga,  meditation, and exercise. A mental health specialist can help determine which treatment is best for you. Some people see improvement with one type of therapy. However, other people require a combination of therapies.   Follow these instructions at home: Lifestyle  Maintain a consistent routine and schedule.  Anticipate stressful situations. Create a plan, and allow extra time to work with your plan.  Practice stress management or self-calming techniques that you have learned from your therapist or your health care provider. General instructions  Take over-the-counter and prescription medicines only as told by your health care provider.  Understand that you are likely to have setbacks. Accept this and be kind to yourself as you persist to take better care of yourself.  Recognize and accept your accomplishments, even if you judge them as small.  Keep all follow-up visits as told by your health care provider. This is important. Contact a health care provider if:  Your symptoms do not get better.  Your symptoms get worse.  You have signs of depression, such as: ? A persistently sad or irritable mood. ? Loss of enjoyment in activities that used to bring you joy. ? Change in weight or eating. ? Changes in sleeping habits. ? Avoiding friends or family members. ? Loss of energy for normal tasks. ? Feelings of guilt or worthlessness. Get help right away if:  You have serious thoughts about hurting yourself or others. If you ever feel like you may hurt yourself or others, or have thoughts about taking your own life, get help right away. Go to your nearest emergency department or:  Call your local emergency services (911 in the [place]  Call a suicide crisis helpline, such as the National Suicide Prevention Lifeline at 1-800-273-8255. This is open 24 hours a day in the [place]  Text the Crisis Text Line at 741741 (in the [place] Summary  Generalized anxiety disorder (GAD) is a mental  health condition that involves worry that is not triggered by a specific event.  People with GAD often worry excessively about many things in their lives, such as their health and family.  GAD may cause symptoms such as restlessness, trouble concentrating, sleep problems, frequent sweating, nausea, diarrhea, headaches, and trembling or muscle twitching.  A mental health specialist can help determine which treatment is best for you. Some people see improvement with one type of therapy. However, other people require a combination of therapies. This information is not intended to replace advice given to you by your health care provider. Make sure you discuss any questions you have with your health care provider. Document Revised: 10/15/2018 Document Reviewed: 10/15/2018 Elsevier Patient Education  2021 [employer].  

## 2020-06-04 LAB — ANEMIA PROFILE B
Basophils Absolute: 0 10*3/uL (ref 0.0–0.2)
Basos: 1 %
EOS (ABSOLUTE): 0 10*3/uL (ref 0.0–0.4)
Eos: 1 %
Ferritin: 18 ng/mL (ref 15–150)
Folate: 11.5 ng/mL (ref >3.0)
Hematocrit: 33.9 % — ABNORMAL LOW (ref 34.0–46.6)
Hemoglobin: 11.5 g/dL (ref 11.1–15.9)
Immature Grans (Abs): 0 10*3/uL (ref 0.0–0.1)
Immature Granulocytes: 0 %
Iron Saturation: 16 % (ref 15–55)
Iron: 54 ug/dL (ref 27–159)
Lymphocytes Absolute: 1.8 10*3/uL (ref 0.7–3.1)
Lymphs: 45 %
MCH: 30.1 pg (ref 26.6–33.0)
MCHC: 33.9 g/dL (ref 31.5–35.7)
MCV: 89 fL (ref 79–97)
Monocytes Absolute: 0.3 10*3/uL (ref 0.1–0.9)
Monocytes: 7 %
Neutrophils Absolute: 1.9 10*3/uL (ref 1.4–7.0)
Neutrophils: 46 %
Platelets: 213 10*3/uL (ref 150–450)
RBC: 3.82 x10E6/uL (ref 3.77–5.28)
RDW: 12.7 % (ref 11.7–15.4)
Retic Ct Pct: 1.1 % (ref 0.6–2.6)
Total Iron Binding Capacity: 343 ug/dL (ref 250–450)
UIBC: 289 ug/dL (ref 131–425)
Vitamin B-12: 204 pg/mL — ABNORMAL LOW (ref 232–1245)
WBC: 4 10*3/uL (ref 3.4–10.8)

## 2020-06-04 LAB — CMP14+EGFR
ALT: 9 IU/L (ref 0–32)
AST: 16 IU/L (ref 0–40)
Albumin/Globulin Ratio: 1.9 (ref 1.2–2.2)
Albumin: 4.4 g/dL (ref 3.8–4.9)
Alkaline Phosphatase: 97 IU/L (ref 44–121)
BUN/Creatinine Ratio: 21 (ref 9–23)
BUN: 19 mg/dL (ref 6–24)
Bilirubin Total: 0.2 mg/dL (ref 0.0–1.2)
CO2: 23 mmol/L (ref 20–29)
Calcium: 9.5 mg/dL (ref 8.7–10.2)
Chloride: 107 mmol/L — ABNORMAL HIGH (ref 96–106)
Creatinine, Ser: 0.9 mg/dL (ref 0.57–1.00)
Globulin, Total: 2.3 g/dL (ref 1.5–4.5)
Glucose: 81 mg/dL (ref 65–99)
Potassium: 3.6 mmol/L (ref 3.5–5.2)
Sodium: 144 mmol/L (ref 134–144)
Total Protein: 6.7 g/dL (ref 6.0–8.5)
eGFR: 75 mL/min/{1.73_m2} (ref >59)

## 2020-06-04 LAB — TSH: TSH: 0.204 u[IU]/mL — ABNORMAL LOW (ref 0.450–4.500)

## 2020-06-08 ENCOUNTER — Other Ambulatory Visit: Payer: Self-pay | Admitting: Family

## 2020-06-08 ENCOUNTER — Ambulatory Visit: Payer: 59 | Admitting: Family

## 2020-06-08 MED ORDER — LEVOTHYROXINE SODIUM 112 MCG PO TABS
112.0000 ug | ORAL_TABLET | Freq: Every day | ORAL | 2 refills | Status: DC
Start: 2020-06-08 — End: 2020-09-08

## 2020-06-09 ENCOUNTER — Other Ambulatory Visit: Payer: Self-pay | Admitting: Family

## 2020-06-09 MED ORDER — CYANOCOBALAMIN 1000 MCG/ML IJ SOLN: 1000.0000 ug | Freq: Once | INTRAMUSCULAR | 11 refills | Status: AC

## 2020-06-09 NOTE — Progress Notes (Signed)
Prescription sent to pharmacy.

## 2020-06-27 ENCOUNTER — Other Ambulatory Visit: Payer: Self-pay | Admitting: Family

## 2020-06-27 DIAGNOSIS — M25561 Pain in right knee: Secondary | ICD-10-CM

## 2020-08-08 ENCOUNTER — Other Ambulatory Visit: Payer: Self-pay | Admitting: Family

## 2020-08-08 DIAGNOSIS — K219 Gastro-esophageal reflux disease without esophagitis: Secondary | ICD-10-CM

## 2020-08-09 ENCOUNTER — Other Ambulatory Visit: Payer: Self-pay | Admitting: Family

## 2020-08-09 DIAGNOSIS — M25561 Pain in right knee: Secondary | ICD-10-CM

## 2020-08-09 DIAGNOSIS — M25562 Pain in left knee: Secondary | ICD-10-CM

## 2020-08-16 ENCOUNTER — Emergency Department (HOSPITAL_COMMUNITY)
Admission: EM | Admit: 2020-08-16 | Discharge: 2020-08-16 | Disposition: A | Discharge status: Home / Self Care | Payer: 59 | Attending: Emergency Medicine | Admitting: Emergency Medicine

## 2020-08-16 ENCOUNTER — Emergency Department (HOSPITAL_COMMUNITY): Payer: 59

## 2020-08-16 ENCOUNTER — Encounter (HOSPITAL_COMMUNITY): Payer: Self-pay

## 2020-08-16 ENCOUNTER — Other Ambulatory Visit: Payer: Self-pay

## 2020-08-16 DIAGNOSIS — R519 Headache, unspecified: Secondary | ICD-10-CM | POA: Insufficient documentation

## 2020-08-16 DIAGNOSIS — E875 Hyperkalemia: Secondary | ICD-10-CM | POA: Diagnosis not present

## 2020-08-16 DIAGNOSIS — E039 Hypothyroidism, unspecified: Secondary | ICD-10-CM | POA: Insufficient documentation

## 2020-08-16 DIAGNOSIS — R0781 Pleurodynia: Secondary | ICD-10-CM

## 2020-08-16 DIAGNOSIS — Z79899 Other long term (current) drug therapy: Secondary | ICD-10-CM | POA: Insufficient documentation

## 2020-08-16 HISTORY — DX: Disorder of thyroid, unspecified: E07.9

## 2020-08-16 LAB — CBC WITH DIFFERENTIAL/PLATELET
Abs Immature Granulocytes: 0 10*3/uL (ref 0.00–0.07)
Basophils Absolute: 0 10*3/uL (ref 0.0–0.1)
Basophils Relative: 1 %
Eosinophils Absolute: 0 10*3/uL (ref 0.0–0.5)
Eosinophils Relative: 0 %
HCT: 38.7 % (ref 36.0–46.0)
Hemoglobin: 12.4 g/dL (ref 12.0–15.0)
Immature Granulocytes: 0 %
Lymphocytes Relative: 41 %
Lymphs Abs: 1.8 10*3/uL (ref 0.7–4.0)
MCH: 30 pg (ref 26.0–34.0)
MCHC: 32 g/dL (ref 30.0–36.0)
MCV: 93.5 fL (ref 80.0–100.0)
Monocytes Absolute: 0.3 10*3/uL (ref 0.1–1.0)
Monocytes Relative: 7 %
Neutro Abs: 2.2 10*3/uL (ref 1.7–7.7)
Neutrophils Relative %: 51 %
Platelets: 225 10*3/uL (ref 150–400)
RBC: 4.14 MIL/uL (ref 3.87–5.11)
RDW: 12.6 % (ref 11.5–15.5)
WBC: 4.3 10*3/uL (ref 4.0–10.5)
nRBC: 0 % (ref 0.0–0.2)

## 2020-08-16 LAB — BASIC METABOLIC PANEL
Anion gap: 8 (ref 5–15)
BUN: 13 mg/dL (ref 6–20)
CO2: 22 mmol/L (ref 22–32)
Calcium: 9.6 mg/dL (ref 8.9–10.3)
Chloride: 109 mmol/L (ref 98–111)
Creatinine, Ser: 0.77 mg/dL (ref 0.44–1.00)
GFR, Estimated: >60 mL/min (ref >60)
Glucose, Bld: 82 mg/dL (ref 70–99)
Potassium: 3.2 mmol/L — ABNORMAL LOW (ref 3.5–5.1)
Sodium: 139 mmol/L (ref 135–145)

## 2020-08-16 MED ORDER — CYCLOBENZAPRINE HCL 10 MG PO TABS
10.0000 mg | ORAL_TABLET | Freq: Once | ORAL | Status: AC
  Administered 2020-08-16: 10 mg via ORAL
  Filled 2020-08-16: qty 1

## 2020-08-16 MED ORDER — KETOROLAC TROMETHAMINE 30 MG/ML IJ SOLN
15.0000 mg | Freq: Once | INTRAMUSCULAR | Status: AC
  Administered 2020-08-16: 15 mg via INTRAMUSCULAR
  Filled 2020-08-16: qty 1

## 2020-08-16 MED ORDER — PROCHLORPERAZINE MALEATE 5 MG PO TABS
10.0000 mg | ORAL_TABLET | Freq: Once | ORAL | Status: AC
  Administered 2020-08-16: 10 mg via ORAL
  Filled 2020-08-16: qty 2

## 2020-08-16 MED ORDER — CYCLOBENZAPRINE HCL 10 MG PO TABS: 10.0000 mg | ORAL_TABLET | Freq: Two times a day (BID) | ORAL | 0 refills | Status: DC | PRN

## 2020-08-16 MED ORDER — KETOROLAC TROMETHAMINE 30 MG/ML IJ SOLN: 30.0000 mg | Freq: Once | INTRAMUSCULAR | Status: DC

## 2020-08-16 NOTE — Discharge Instructions (Signed)
Lab work and imaging are all reassuring.   Prescription  for a muscle relaxer this can make you drowsy do not consume alcohol or operate heavy machinery when taking this medication.  I also recommend over-the-counter pain medication like ibuprofen and Tylenol.  Please follow-up with your PCP for further evaluation.  Come back to the emergency department if you develop chest pain, shortness of breath, severe abdominal pain, uncontrolled nausea, vomiting, diarrhea.

## 2020-08-16 NOTE — ED Triage Notes (Signed)
Pt. States they believe they had a panic attack last night. They state they feel really anxious today. Pt. States they haven't been able to eat for a month without swelling up.

## 2020-08-16 NOTE — ED Provider Notes (Signed)
[facility] EMERGENCY DEPARTMENT Provider Note   CSN: [id number] Arrival date & time: 08/16/20  1138     History Chief Complaint  Patient presents with   Anxiety    Phyllis Parker is a 60 y.o. female.  HPI  Patient with significant medical history of anxiety, depression thyroid disease presents to the emergency department with chief complaint of a panic attack which has since resolved as well as left rib pain.  Patient states she had a pain attack last night, came on suddenly, states she felt anxious but this is since resolved.  She denies any suicidal or homicidal ideations, denies hallucinations or delusions, states she has been taking her antianxiety and depression medications as prescribed.  She denies any illicit drug use.  She now endorses that she wanted to be checked for her left rib pain, patient states she has been having left rib pain for approximately 3 months, states the pain comes and goes, she denies any aggravating or alleviating factors, describes it as a dull-like sensation, currently does not have this pain right now.  She denies chest pain, shortness of breath, worsening pedal edema.  She has no complaints at this time.  Past Medical History:  Diagnosis Date   ADD (attention deficit disorder)    Anxiety    Depression    Thyroid disease     Patient Active Problem List   Diagnosis Date Noted   GAD (generalized anxiety disorder) 02/04/2019   Primary insomnia 02/04/2019   Elevated lipoprotein(a) 02/04/2019   Insomnia due to other mental disorder 12/18/2017   Osteopenia 09/17/2017   Depression 11/29/2015   Benign paroxysmal positional vertigo 11/29/2015   Adjustment disorder with anxious mood 11/02/2015   Attention deficit hyperactivity disorder (ADHD), predominantly inattentive type 11/02/2015   Gastroesophageal reflux disease without esophagitis 11/02/2015   Hypothyroidism 11/02/2015   Arthralgia of both lower legs 11/02/2015    History reviewed. No pertinent  surgical history.   OB History   No obstetric history on file.     Family History  Problem Relation Age of Onset   Cancer Mother    Prostate cancer Father     Social History   Tobacco Use   Smoking status: Never   Smokeless tobacco: Never  Vaping Use   Vaping Use: Never used  Substance Use Topics   Alcohol use: No   Drug use: No    Home Medications Prior to Admission medications   Medication Sig Start Date End Date Taking? Authorizing Provider  cyclobenzaprine (FLEXERIL) 10 MG tablet Take 1 tablet (10 mg total) by mouth 2 (two) times daily as needed for muscle spasms. 08/16/20  Yes Carroll Sage, PA-C  albuterol (VENTOLIN HFA) 108 (90 Base) MCG/ACT inhaler Inhale 2 puffs into the lungs every 6 hours as needed for wheezing or shortness of breath. 02/03/19   Remus Loffler, PA-C  ALPRAZolam Prudy Feeler) 1 MG tablet Take 1 tablet (1 mg total) by mouth 3 (three) times daily as needed for anxiety. 06/03/20   Junie Spencer, FNP  atorvastatin (LIPITOR) 10 MG tablet Take 1 tablet (10 mg total) by mouth daily. 09/16/18   Remus Loffler, PA-C  budesonide-formoterol (SYMBICORT) 80-4.5 MCG/ACT inhaler Inhale 2 puffs into the lungs 2 (two) times daily. 02/03/19   Remus Loffler, PA-C  busPIRone (BUSPAR) 15 MG tablet Take 1 tablet (15 mg total) by mouth 3 (three) times daily. 12/14/19   Jannifer Rodney A, FNP  dicyclomine (BENTYL) 10 MG capsule TAKE 1 CAPSULE FOUR  TIMES DAILY BEFORE MEALS AND AT BEDTIME AS NEEDED 08/09/20   Jannifer Rodney A, FNP  esomeprazole (NEXIUM) 40 MG capsule TAKE 1 CAPSULE DAILY AT NOON 08/08/20   Jannifer Rodney A, FNP  gabapentin (NEURONTIN) 100 MG capsule Take 1 to 3 capsules (100-300 mg total) by mouth at bedtime. 08/09/20   Junie Spencer, FNP  levothyroxine (SYNTHROID) 112 MCG tablet Take 1 tablet (112 mcg total) by mouth daily. 06/08/20 06/08/21  Junie Spencer, FNP  meloxicam (MOBIC) 15 MG tablet Take 1 tablet (15 mg total) by mouth daily. 06/28/20   Jannifer Rodney A, FNP   montelukast (SINGULAIR) 10 MG tablet Take 1 tablet (10 mg total) by mouth at bedtime. 09/08/19   Jannifer Rodney A, FNP  sertraline (ZOLOFT) 100 MG tablet Take 1 tablet (100 mg total) by mouth daily. 03/17/20   Jannifer Rodney A, FNP  SUMAtriptan (IMITREX) 50 MG tablet TAKE 1 OR 2 TABLETS AT ONSET OF MIGRAINE. MAY REPEAT IN 2 HOURS IF NEEDED 04/24/19   Daphine Deutscher, Mary-Margaret, FNP  vortioxetine HBr (TRINTELLIX) 20 MG TABS tablet Take 1 tablet (20 mg total) by mouth daily. Patient not taking: Reported on 06/03/2020 03/17/20   Junie Spencer, FNP  zolpidem (AMBIEN) 10 MG tablet Take 1 tablet (10 mg total) by mouth at bedtime as needed. for sleep 06/03/20   Jannifer Rodney A, FNP    Allergies    Sulfa antibiotics and Vioxx [rofecoxib]  Review of Systems   Review of Systems  Constitutional:  Negative for chills and fever.  HENT:  Negative for congestion.   Respiratory:  Negative for shortness of breath.   Cardiovascular:  Negative for chest pain.  Gastrointestinal:  Negative for abdominal pain and vomiting.  Genitourinary:  Negative for enuresis.  Musculoskeletal:  Negative for back pain.       Left rib pain.   Skin:  Negative for rash.  Neurological:  Negative for headaches.  Hematological:  Does not bruise/bleed easily.  Psychiatric/Behavioral:  Negative for hallucinations, self-injury and suicidal ideas. The patient is not nervous/anxious.    Physical Exam Updated Vital Signs BP (!) 140/94   Pulse 84   Temp 98 F (36.7 C) (Oral)   Resp 18   Ht 5\' 4"  (1.626 m)   Wt 73 kg   SpO2 100%   BMI 27.64 kg/m   Physical Exam Vitals and nursing note reviewed.  Constitutional:      General: She is not in acute distress.    Appearance: She is not ill-appearing.  HENT:     Head: Normocephalic and atraumatic.     Nose: No congestion.  Eyes:     Conjunctiva/sclera: Conjunctivae normal.  Cardiovascular:     Rate and Rhythm: Normal rate and regular rhythm.     Pulses: Normal pulses.      Heart sounds: No murmur heard.   No friction rub. No gallop.  Pulmonary:     Effort: No respiratory distress.     Breath sounds: No wheezing, rhonchi or rales.  Musculoskeletal:     Right lower leg: No edema.     Left lower leg: No edema.     Comments: Patient's ribs were palpated she has tenderness to palpation on her left ribs 4 and 5 mid axillary no crepitus or deformities present.  Skin:    General: Skin is warm and dry.  Neurological:     Mental Status: She is alert.     Comments: No facial asymmetry, no difficulty word finding, no  slurring of her words, able to follow commands, no unilateral weakness present.  Psychiatric:        Mood and Affect: Mood normal.    ED Results / Procedures / Treatments   Labs (all labs ordered are listed, but only abnormal results are displayed) Labs Reviewed  BASIC METABOLIC PANEL - Abnormal; Notable for the following components:      Result Value   Potassium 3.2 (*)    All other components within normal limits  CBC WITH DIFFERENTIAL/PLATELET    EKG EKG Interpretation  Date/Time:  Tuesday August 16 2020 12:33:04 EDT Ventricular Rate:  69 PR Interval:  110 QRS Duration: 82 QT Interval:  410 QTC Calculation: 439 R Axis:   42 Text Interpretation: duplicate Confirmed by Susy Frizzle [phone]) on 08/16/2020 12:40:16 PM  Radiology DG Ribs Unilateral W/Chest Left  Result Date: 08/16/2020 CLINICAL DATA:  Pain along the mid axillary fourth and fifth ribs EXAM: LEFT RIBS AND CHEST - 3+ VIEW COMPARISON:  None available FINDINGS: No fracture or other bone lesions are seen involving the ribs. There is no evidence of pneumothorax or pleural effusion. Both lungs are clear. Heart size and mediastinal contours are within normal limits. IMPRESSION: Negative. Electronically Signed   By: Jasmine Pang M.D.   On: 08/16/2020 15:48    Procedures Procedures   Medications Ordered in ED Medications  prochlorperazine (COMPAZINE) tablet 10 mg (10 mg Oral  Given 08/16/20 1611)  cyclobenzaprine (FLEXERIL) tablet 10 mg (10 mg Oral Given 08/16/20 1611)  ketorolac (TORADOL) 30 MG/ML injection 15 mg (15 mg Intramuscular Given 08/16/20 1658)    ED Course  I have reviewed the triage vital signs and the nursing notes.  Pertinent labs & imaging results that were available during my care of the patient were reviewed by me and considered in my medical decision making (see chart for details).    MDM Rules/Calculators/A&P                           Initial impression-patient presents with left-sided rib pain.  She is alert, does not appear in acute stress, vital signs reassuring.  Will obtain imaging and basic lab work.   Work-up-CBC unremarkable, BMP shows slight hyperkalemia 3.2, DG of ribs negative for acute findings.  Reassessment-patient endorses that she is having a headache, in the front of her head, no associated change in vision, paresthesia weakness upper lower extremities, will provide patient with Compazine.  Updated patient on lab or imaging, patient is agreeable for discharge at this time.  Rule out-low suspicion for ACS as patient has chest pain, shortness of breath, history is atypical, patient has low risk factors, EKG without signs of ischemia.  Low suspicion for hyperthyroidism she denies  neck/ throat pain, patient is nontachycardic.  Low suspicion for rib fracture, pneumothorax, infection as imaging is negative for acute findings.  Low suspicion for psychiatric emergency as patient denies suicidal homicidal ideations, hallucinations or delusions.  She does not appear to be responding to internal stimuli.  Low suspicion for intracranial head bleed and or CVA as there is no focal deficits present on exam, patient denies recent head trauma not on antecolic, denies change in vision, paresthesias or weakness to upper and lower extremities.  Plan-  Right-sided rib pain suspect this is likely muscular in nature, will provide patient with muscle  relaxers, anti-inflammatories,  follow-up with PCP for further evaluation.  Vital signs have remained stable, no indication for hospital admission.  Patient given at home care as well strict return precautions.  Patient verbalized that they understood agreed to said plan.   Final Clinical Impression(s) / ED Diagnoses Final diagnoses:  Rib pain on left side    Rx / DC Orders ED Discharge Orders          Ordered    cyclobenzaprine (FLEXERIL) 10 MG tablet  2 times daily PRN        08/16/20 1703             Carroll SageFaulkner, Romina Divirgilio J, PA-C 08/16/20 1704    Benjiman CorePickering, Nathan, MD 08/17/20 [phone]

## 2020-08-29 ENCOUNTER — Other Ambulatory Visit: Payer: Self-pay | Admitting: Family

## 2020-08-29 DIAGNOSIS — R062 Wheezing: Secondary | ICD-10-CM

## 2020-09-06 ENCOUNTER — Ambulatory Visit: Payer: 59 | Admitting: Family

## 2020-09-06 ENCOUNTER — Other Ambulatory Visit: Payer: Self-pay

## 2020-09-06 ENCOUNTER — Encounter: Payer: Self-pay | Admitting: Family

## 2020-09-06 VITALS — BP 123/79 | HR 84 | Temp 97.7°F | Ht 64.0 in | Wt 155.6 lb

## 2020-09-06 DIAGNOSIS — F99 Mental disorder, not otherwise specified: Secondary | ICD-10-CM

## 2020-09-06 DIAGNOSIS — Z79899 Other long term (current) drug therapy: Secondary | ICD-10-CM | POA: Insufficient documentation

## 2020-09-06 DIAGNOSIS — F331 Major depressive disorder, recurrent, moderate: Secondary | ICD-10-CM

## 2020-09-06 DIAGNOSIS — M2550 Pain in unspecified joint: Secondary | ICD-10-CM

## 2020-09-06 DIAGNOSIS — F411 Generalized anxiety disorder: Secondary | ICD-10-CM | POA: Diagnosis not present

## 2020-09-06 DIAGNOSIS — Z0001 Encounter for general adult medical examination with abnormal findings: Secondary | ICD-10-CM

## 2020-09-06 DIAGNOSIS — K219 Gastro-esophageal reflux disease without esophagitis: Secondary | ICD-10-CM

## 2020-09-06 DIAGNOSIS — F5105 Insomnia due to other mental disorder: Secondary | ICD-10-CM

## 2020-09-06 DIAGNOSIS — F5101 Primary insomnia: Secondary | ICD-10-CM

## 2020-09-06 DIAGNOSIS — Z1211 Encounter for screening for malignant neoplasm of colon: Secondary | ICD-10-CM

## 2020-09-06 DIAGNOSIS — E039 Hypothyroidism, unspecified: Secondary | ICD-10-CM

## 2020-09-06 DIAGNOSIS — Z0000 Encounter for general adult medical examination without abnormal findings: Secondary | ICD-10-CM

## 2020-09-06 DIAGNOSIS — F4322 Adjustment disorder with anxiety: Secondary | ICD-10-CM | POA: Diagnosis not present

## 2020-09-06 MED ORDER — ALPRAZOLAM 1 MG PO TABS: 1.0000 mg | ORAL_TABLET | Freq: Three times a day (TID) | ORAL | 2 refills | Status: DC | PRN

## 2020-09-06 MED ORDER — ZOLPIDEM TARTRATE 10 MG PO TABS: 10.0000 mg | ORAL_TABLET | Freq: Every evening | ORAL | 5 refills | Status: DC | PRN

## 2020-09-06 NOTE — Addendum Note (Signed)
Addended by: Ignacia Bayley on: 09/06/2020 08:48 AM   Modules accepted: Orders

## 2020-09-06 NOTE — Addendum Note (Signed)
Addended by: Jannifer Rodney A on: 09/06/2020 08:44 AM   Modules accepted: Level of Service

## 2020-09-06 NOTE — Progress Notes (Signed)
Subjective:    Patient ID: Phyllis Parker, female    DOB: 1960-08-12, 60 y.o.   MRN: [id number]  Chief Complaint  Patient presents with   Medical Management of Chronic Issues    Discuss genesight testing    Pt presents to the office today for CPE without pap. She is anxious and states she could not tolerate the Trintellix, Celexa, Lexapro, Wellbutrin, and Cymbalta. States none of these seemed to work.   She does report her job role changed that has slightly helped with her stress.   She is also complaining of generalized joint pain in bilateral wrist, thoracic pain/ribs, left shoulder pain of 5 out 10. This started 2-3 months ago, but does seem to be improving. She has taken motrin with mild relief.  Gastroesophageal Reflux She complains of belching and heartburn. This is a chronic problem. The current episode started more than 1 year ago. The problem occurs occasionally. Associated symptoms include fatigue. She has tried a PPI for the symptoms. The treatment provided moderate relief.  Thyroid Problem Presents for follow-up visit. Symptoms include anxiety, depressed mood and fatigue. Patient reports no dry skin. The symptoms have been stable. Her past medical history is significant for hyperlipidemia.  Insomnia Primary symptoms: difficulty falling asleep, frequent awakening.   PMH includes: depression.   Anxiety Presents for follow-up visit. Symptoms include depressed mood, excessive worry, insomnia, irritability, nervous/anxious behavior and restlessness. Symptoms occur most days. The severity of symptoms is moderate.    Depression        This is a chronic problem.  The current episode started more than 1 year ago.   Associated symptoms include fatigue, helplessness, hopelessness, insomnia, irritable, restlessness, decreased interest and sad.  Past treatments include SSRIs - Selective serotonin reuptake inhibitors.  Compliance with treatment is good.  Past medical history includes thyroid  problem and anxiety.   Hyperlipidemia This is a chronic problem. The current episode started more than 1 year ago. Exacerbating diseases include obesity. Current antihyperlipidemic treatment includes statins. The current treatment provides moderate improvement of lipids. Risk factors for coronary artery disease include dyslipidemia and a sedentary lifestyle.     Review of Systems  Constitutional:  Positive for fatigue and irritability.  Gastrointestinal:  Positive for heartburn.  Psychiatric/Behavioral:  Positive for depression. The patient is nervous/anxious and has insomnia.   All other systems reviewed and are negative.  Family History  Problem Relation Age of Onset   Cancer Mother    Prostate cancer Father    Social History   Socioeconomic History   Marital status: Married    Spouse name: Not on file   Number of children: Not on file   Years of education: Not on file   Highest education level: Not on file  Occupational History   Not on file  Tobacco Use   Smoking status: Never   Smokeless tobacco: Never  Vaping Use   Vaping Use: Never used  Substance and Sexual Activity   Alcohol use: No   Drug use: No   Sexual activity: Not on file  Other Topics Concern   Not on file  Social History Narrative   ** Merged History Encounter **       Social Determinants of Health   Financial Resource Strain: Not on file  Food Insecurity: Not on file  Transportation Needs: Not on file  Physical Activity: Not on file  Stress: Not on file  Social Connections: Not on file       Objective:  Physical Exam Vitals reviewed.  Constitutional:      General: She is irritable. She is not in acute distress.    Appearance: She is well-developed.  HENT:     Head: Normocephalic and atraumatic.     Right Ear: Tympanic membrane normal.     Left Ear: Tympanic membrane normal.  Eyes:     Pupils: Pupils are equal, round, and reactive to light.  Neck:     Thyroid: No thyromegaly.   Cardiovascular:     Rate and Rhythm: Normal rate and regular rhythm.     Heart sounds: Normal heart sounds. No murmur heard. Pulmonary:     Effort: Pulmonary effort is normal. No respiratory distress.     Breath sounds: Normal breath sounds. No wheezing.  Abdominal:     General: Bowel sounds are normal. There is no distension.     Palpations: Abdomen is soft.     Tenderness: There is no abdominal tenderness.  Musculoskeletal:        General: No tenderness. Normal range of motion.     Cervical back: Normal range of motion and neck supple.  Skin:    General: Skin is warm and dry.  Neurological:     Mental Status: She is alert and oriented to person, place, and time.     Cranial Nerves: No cranial nerve deficit.     Deep Tendon Reflexes: Reflexes are normal and symmetric.  Psychiatric:        Behavior: Behavior normal.        Thought Content: Thought content normal.        Judgment: Judgment normal.      BP 123/79   Pulse 84   Temp 97.7 F (36.5 C) (Temporal)   Ht 5' 4"  (1.626 m)   Wt 155 lb 9.6 oz (70.6 kg)   BMI 26.71 kg/m      Assessment & Plan:  Phyllis Parker comes in today with chief complaint of Medical Management of Chronic Issues (Discuss genesight testing/)   Diagnosis and orders addressed:  1. Annual physical exam - ToxASSURE Select 13 (MW), Urine - CBC with Differential/Platelet - CMP14+EGFR - Lipid panel - TSH - Ambulatory referral to Gastroenterology  2. GAD (generalized anxiety disorder)  - ALPRAZolam (XANAX) 1 MG tablet; Take 1 tablet (1 mg total) by mouth 3 (three) times daily as needed for anxiety.  Dispense: 60 tablet; Refill: 2 - CBC with Differential/Platelet - CMP14+EGFR  3. Adjustment disorder with anxious mood - ALPRAZolam (XANAX) 1 MG tablet; Take 1 tablet (1 mg total) by mouth 3 (three) times daily as needed for anxiety.  Dispense: 60 tablet; Refill: 2 - CBC with Differential/Platelet - CMP14+EGFR  4. Primary insomnia - zolpidem  (AMBIEN) 10 MG tablet; Take 1 tablet (10 mg total) by mouth at bedtime as needed. for sleep  Dispense: 30 tablet; Refill: 5 - CBC with Differential/Platelet - CMP14+EGFR  5. Gastroesophageal reflux disease without esophagitis - CBC with Differential/Platelet - CMP14+EGFR  6. Acquired hypothyroidism - CBC with Differential/Platelet - CMP14+EGFR - TSH  7. Moderate episode of recurrent major depressive disorder (HCC) - CBC with Differential/Platelet - CMP14+EGFR  8. Insomnia due to other mental disorder - CBC with Differential/Platelet - CMP14+EGFR  9. Colon cancer screening - CBC with Differential/Platelet - CMP14+EGFR - Ambulatory referral to Gastroenterology  10. Controlled substance agreement signed  - ALPRAZolam (XANAX) 1 MG tablet; Take 1 tablet (1 mg total) by mouth 3 (three) times daily as needed for anxiety.  Dispense: 60 tablet;  Refill: 2 - zolpidem (AMBIEN) 10 MG tablet; Take 1 tablet (10 mg total) by mouth at bedtime as needed. for sleep  Dispense: 30 tablet; Refill: 5 - ToxASSURE Select 13 (MW), Urine - CBC with Differential/Platelet - CMP14+EGFR  11. Generalized joint pain Worse while working, continue [employer] pending Health Maintenance reviewed Diet and exercise encouraged  Follow up plan: 3 months    Evelina Dun, FNP

## 2020-09-06 NOTE — Patient Instructions (Signed)
Health Maintenance, Female Adopting a healthy lifestyle and getting preventive care are important in promoting health and wellness. Ask your health care provider about: The right schedule for you to have regular tests and exams. Things you can do on your own to prevent diseases and keep yourself healthy. What should I know about diet, weight, and exercise? Eat a healthy diet  Eat a diet that includes plenty of vegetables, fruits, low-fat dairy products, and lean protein. Do not eat a lot of foods that are high in solid fats, added sugars, or sodium.  Maintain a healthy weight Body mass index (BMI) is used to identify weight problems. It estimates body fat based on height and weight. Your health care provider can help determineyour BMI and help you achieve or maintain a healthy weight. Get regular exercise Get regular exercise. This is one of the most important things you can do for your health. Most adults should: Exercise for at least 150 minutes each week. The exercise should increase your heart rate and make you sweat (moderate-intensity exercise). Do strengthening exercises at least twice a week. This is in addition to the moderate-intensity exercise. Spend less time sitting. Even light physical activity can be beneficial. Watch cholesterol and blood lipids Have your blood tested for lipids and cholesterol at 58 years of age, then havethis test every 5 years. Have your cholesterol levels checked more often if: Your lipid or cholesterol levels are high. You are older than 58 years of age. You are at high risk for heart disease. What should I know about cancer screening? Depending on your health history and family history, you may need to have cancer screening at various ages. This may include screening for: Breast cancer. Cervical cancer. Colorectal cancer. Skin cancer. Lung cancer. What should I know about heart disease, diabetes, and high blood pressure? Blood pressure and heart  disease High blood pressure causes heart disease and increases the risk of stroke. This is more likely to develop in people who have high blood pressure readings, are of African descent, or are overweight. Have your blood pressure checked: Every 3-5 years if you are 18-39 years of age. Every year if you are 40 years old or older. Diabetes Have regular diabetes screenings. This checks your fasting blood sugar level. Have the screening done: Once every three years after age 40 if you are at a normal weight and have a low risk for diabetes. More often and at a younger age if you are overweight or have a high risk for diabetes. What should I know about preventing infection? Hepatitis B If you have a higher risk for hepatitis B, you should be screened for this virus. Talk with your health care provider to find out if you are at risk forhepatitis B infection. Hepatitis C Testing is recommended for: Everyone born from 1945 through 1965. Anyone with known risk factors for hepatitis C. Sexually transmitted infections (STIs) Get screened for STIs, including gonorrhea and chlamydia, if: You are sexually active and are younger than 58 years of age. You are older than 58 years of age and your health care provider tells you that you are at risk for this type of infection. Your sexual activity has changed since you were last screened, and you are at increased risk for chlamydia or gonorrhea. Ask your health care provider if you are at risk. Ask your health care provider about whether you are at high risk for HIV. Your health care provider may recommend a prescription medicine to help   prevent HIV infection. If you choose to take medicine to prevent HIV, you should first get tested for HIV. You should then be tested every 3 months for as long as you are taking the medicine. Pregnancy If you are about to stop having your period (premenopausal) and you may become pregnant, seek counseling before you get  pregnant. Take 400 to 800 micrograms (mcg) of folic acid every day if you become pregnant. Ask for birth control (contraception) if you want to prevent pregnancy. Osteoporosis and menopause Osteoporosis is a disease in which the bones lose minerals and strength with aging. This can result in bone fractures. If you are 65 years old or older, or if you are at risk for osteoporosis and fractures, ask your health care provider if you should: Be screened for bone loss. Take a calcium or vitamin D supplement to lower your risk of fractures. Be given hormone replacement therapy (HRT) to treat symptoms of menopause. Follow these instructions at home: Lifestyle Do not use any products that contain nicotine or tobacco, such as cigarettes, e-cigarettes, and chewing tobacco. If you need help quitting, ask your health care provider. Do not use street drugs. Do not share needles. Ask your health care provider for help if you need support or information about quitting drugs. Alcohol use Do not drink alcohol if: Your health care provider tells you not to drink. You are pregnant, may be pregnant, or are planning to become pregnant. If you drink alcohol: Limit how much you use to 0-1 drink a day. Limit intake if you are breastfeeding. Be aware of how much alcohol is in your drink. In the [place] one drink equals one 12 oz bottle of beer (355 mL), one 5 oz glass of wine (148 mL), or one 1 oz glass of hard liquor (44 mL). General instructions Schedule regular health, dental, and eye exams. Stay current with your vaccines. Tell your health care provider if: You often feel depressed. You have ever been abused or do not feel safe at home. Summary Adopting a healthy lifestyle and getting preventive care are important in promoting health and wellness. Follow your health care provider's instructions about healthy diet, exercising, and getting tested or screened for diseases. Follow your health care provider's  instructions on monitoring your cholesterol and blood pressure. This information is not intended to replace advice given to you by your health care provider. Make sure you discuss any questions you have with your healthcare provider. Document Revised: 12/18/2017 Document Reviewed: 12/18/2017 Elsevier Patient Education  2022 Elsevier Inc.  

## 2020-09-08 ENCOUNTER — Encounter: Payer: Self-pay | Admitting: Internal Medicine

## 2020-09-08 ENCOUNTER — Other Ambulatory Visit: Payer: Self-pay | Admitting: Family

## 2020-09-08 MED ORDER — LEVOTHYROXINE SODIUM 88 MCG PO TABS: 88.0000 ug | ORAL_TABLET | Freq: Every day | ORAL | 11 refills | Status: DC

## 2020-09-09 ENCOUNTER — Other Ambulatory Visit: Payer: Self-pay | Admitting: Family

## 2020-09-09 DIAGNOSIS — K219 Gastro-esophageal reflux disease without esophagitis: Secondary | ICD-10-CM

## 2020-09-13 ENCOUNTER — Other Ambulatory Visit: Payer: Self-pay | Admitting: Family

## 2020-09-13 DIAGNOSIS — Z1231 Encounter for screening mammogram for malignant neoplasm of breast: Secondary | ICD-10-CM

## 2020-09-13 LAB — CBC WITH DIFFERENTIAL/PLATELET
Basophils Absolute: 0 10*3/uL (ref 0.0–0.2)
Basos: 1 %
EOS (ABSOLUTE): 0 10*3/uL (ref 0.0–0.4)
Eos: 0 %
Hematocrit: 37.1 % (ref 34.0–46.6)
Hemoglobin: 12.2 g/dL (ref 11.1–15.9)
Immature Grans (Abs): 0 10*3/uL (ref 0.0–0.1)
Immature Granulocytes: 0 %
Lymphocytes Absolute: 1.4 10*3/uL (ref 0.7–3.1)
Lymphs: 40 %
MCH: 29 pg (ref 26.6–33.0)
MCHC: 32.9 g/dL (ref 31.5–35.7)
MCV: 88 fL (ref 79–97)
Monocytes Absolute: 0.3 10*3/uL (ref 0.1–0.9)
Monocytes: 8 %
Neutrophils Absolute: 1.8 10*3/uL (ref 1.4–7.0)
Neutrophils: 51 %
Platelets: 253 10*3/uL (ref 150–450)
RBC: 4.2 x10E6/uL (ref 3.77–5.28)
RDW: 12.4 % (ref 11.7–15.4)
WBC: 3.5 10*3/uL (ref 3.4–10.8)

## 2020-09-13 LAB — BENZODIAZEPINES,MS,WB/SP RFX

## 2020-09-13 LAB — CMP14+EGFR
ALT: 11 IU/L (ref 0–32)
AST: 18 IU/L (ref 0–40)
Albumin/Globulin Ratio: 2 (ref 1.2–2.2)
Albumin: 4.5 g/dL (ref 3.8–4.9)
Alkaline Phosphatase: 94 IU/L (ref 44–121)
BUN/Creatinine Ratio: 20 (ref 9–23)
BUN: 16 mg/dL (ref 6–24)
Bilirubin Total: <0.2 mg/dL (ref 0.0–1.2)
CO2: 21 mmol/L (ref 20–29)
Calcium: 9.2 mg/dL (ref 8.7–10.2)
Chloride: 104 mmol/L (ref 96–106)
Creatinine, Ser: 0.82 mg/dL (ref 0.57–1.00)
Globulin, Total: 2.2 g/dL (ref 1.5–4.5)
Glucose: 78 mg/dL (ref 65–99)
Potassium: 4 mmol/L (ref 3.5–5.2)
Sodium: 141 mmol/L (ref 134–144)
Total Protein: 6.7 g/dL (ref 6.0–8.5)
eGFR: 83 mL/min/{1.73_m2} (ref >59)

## 2020-09-13 LAB — DRUG SCREEN 10 W/CONF, SERUM
Amphetamines, IA: NEGATIVE ng/mL
Barbiturates, IA: NEGATIVE ug/mL
Cocaine & Metabolite, IA: NEGATIVE ng/mL
Methadone, IA: NEGATIVE ng/mL
Opiates, IA: NEGATIVE ng/mL
Oxycodones, IA: NEGATIVE ng/mL
Phencyclidine, IA: NEGATIVE ng/mL
Propoxyphene, IA: NEGATIVE ng/mL
THC(Marijuana) Metabolite, IA: NEGATIVE ng/mL

## 2020-09-13 LAB — LIPID PANEL
Chol/HDL Ratio: 3.6 ratio (ref 0.0–4.4)
Cholesterol, Total: 212 mg/dL — ABNORMAL HIGH (ref 100–199)
HDL: 59 mg/dL (ref >39)
LDL Chol Calc (NIH): 122 mg/dL — ABNORMAL HIGH (ref 0–99)
Triglycerides: 176 mg/dL — ABNORMAL HIGH (ref 0–149)
VLDL Cholesterol Cal: 31 mg/dL (ref 5–40)

## 2020-09-13 LAB — TSH: TSH: 0.024 u[IU]/mL — ABNORMAL LOW (ref 0.450–4.500)

## 2020-09-20 ENCOUNTER — Ambulatory Visit: Payer: 59 | Admitting: Family Medicine

## 2020-09-20 ENCOUNTER — Encounter: Payer: Self-pay | Admitting: Family Medicine

## 2020-09-20 ENCOUNTER — Other Ambulatory Visit: Payer: Self-pay

## 2020-09-20 VITALS — BP 127/79 | HR 85 | Temp 97.6°F | Ht 64.0 in | Wt 156.4 lb

## 2020-09-20 DIAGNOSIS — F411 Generalized anxiety disorder: Secondary | ICD-10-CM | POA: Diagnosis not present

## 2020-09-20 DIAGNOSIS — F331 Major depressive disorder, recurrent, moderate: Secondary | ICD-10-CM | POA: Diagnosis not present

## 2020-09-20 NOTE — Progress Notes (Signed)
Assessment & Plan:  1-2. Moderate episode of recurrent major depressive disorder (HCC)/GAD (generalized anxiety disorder) GeneSight testing completed today.   Follow up plan: Return in about 2 months (around 11/20/2020) for Mood with PCP.  Phyllis Boston, MSN, APRN, FNP-C [facility]  Subjective:   Patient ID: Phyllis Parker, female    DOB: [birth year], 60 y.o.   MRN: [id number]  HPI: Phyllis Parker is a 60 y.o. female presenting on 09/20/2020 for gene sight  Patient is here for GeneSight testing due to uncontrolled anxiety and depression.  She has failed treatment with Trintellix, Celexa, Lexapro, Wellbutrin, and Cymbalta.  She is currently taking BuSpar and Zoloft and does not feel well controlled.  Depression screen [facility] 2/9 09/20/2020 09/06/2020 06/03/2020  Decreased Interest 1 1 1   Down, Depressed, Hopeless 1 1 1   PHQ - 2 Score 2 2 2   Altered sleeping 1 1 1   Tired, decreased energy 1 1 1   Change in appetite 1 1 1   Feeling bad or failure about yourself  1 1 1   Trouble concentrating 1 1 1   Moving slowly or fidgety/restless 1 1 1   Suicidal thoughts 0 1 0  PHQ-9 Score 8 9 8   Difficult doing work/chores Somewhat difficult Somewhat difficult Somewhat difficult  Some recent data might be hidden   GAD 7 : Generalized Anxiety Score 09/20/2020 09/06/2020 06/03/2020 03/17/2020  Nervous, Anxious, on Edge 1 1 1 1   Control/stop worrying 1 1 1 1   Worry too much - different things 1 1 1 1   Trouble relaxing 1 1 1 1   Restless 1 1 1 1   Easily annoyed or irritable 1 1 1 1   Afraid - awful might happen 1 1 1 1   Total GAD 7 Score 7 7 7 7   Anxiety Difficulty Somewhat difficult Somewhat difficult Somewhat difficult Somewhat difficult    ROS: Negative unless specifically indicated above in HPI.   Relevant past medical history reviewed and updated as indicated.   Allergies and medications reviewed and updated.   Current Outpatient Medications:    albuterol (VENTOLIN HFA) 108 (90  Base) MCG/ACT inhaler, Inhale 2 puffs into the lungs every 6 hours as needed for wheezing or shortness of breath., Disp: 18 g, Rfl: 2   ALPRAZolam (XANAX) 1 MG tablet, Take 1 tablet (1 mg total) by mouth 3 (three) times daily as needed for anxiety., Disp: 60 tablet, Rfl: 2   atorvastatin (LIPITOR) 10 MG tablet, Take 1 tablet (10 mg total) by mouth daily., Disp: 90 tablet, Rfl: 3   budesonide-formoterol (SYMBICORT) 80-4.5 MCG/ACT inhaler, Inhale 2 puffs into the lungs 2 (two) times daily., Disp: 1 Inhaler, Rfl: 3   busPIRone (BUSPAR) 15 MG tablet, Take 1 tablet (15 mg total) by mouth 3 (three) times daily., Disp: 90 tablet, Rfl: 2   dicyclomine (BENTYL) 10 MG capsule, TAKE 1 CAPSULE FOUR TIMES DAILY BEFORE MEALS AND AT BEDTIME AS NEEDED, Disp: 90 capsule, Rfl: 0   esomeprazole (NEXIUM) 40 MG capsule, TAKE 1 CAPSULE DAILY AT NOON, Disp: 30 capsule, Rfl: 0   gabapentin (NEURONTIN) 100 MG capsule, Take 1 to 3 capsules (100-300 mg total) by mouth at bedtime., Disp: 90 capsule, Rfl: 3   levothyroxine (SYNTHROID) 88 MCG tablet, Take 1 tablet (88 mcg total) by mouth daily., Disp: 30 tablet, Rfl: 11   meloxicam (MOBIC) 15 MG tablet, Take 1 tablet (15 mg total) by mouth daily., Disp: 30 tablet, Rfl: 2   montelukast (SINGULAIR) 10 MG tablet, TAKE ONE TABLET AT BEDTIME,  Disp: 30 tablet, Rfl: 5   sertraline (ZOLOFT) 100 MG tablet, Take 1 tablet (100 mg total) by mouth daily., Disp: 30 tablet, Rfl: 1   SUMAtriptan (IMITREX) 50 MG tablet, TAKE 1 OR 2 TABLETS AT ONSET OF MIGRAINE. MAY REPEAT IN 2 HOURS IF NEEDED, Disp: 9 tablet, Rfl: 0   zolpidem (AMBIEN) 10 MG tablet, Take 1 tablet (10 mg total) by mouth at bedtime as needed. for sleep, Disp: 30 tablet, Rfl: 5  Allergies  Allergen Reactions   Sulfa Antibiotics Nausea And Vomiting   Vioxx [Rofecoxib]     Objective:   BP 127/79   Pulse 85   Temp 97.6 F (36.4 C) (Temporal)   Ht 5\' 4"  (1.626 m)   Wt 156 lb 6.4 oz (70.9 kg)   BMI 26.85 kg/m    Physical  Exam Vitals reviewed.  Constitutional:      General: She is not in acute distress.    Appearance: Normal appearance. She is not ill-appearing, toxic-appearing or diaphoretic.  HENT:     Head: Normocephalic and atraumatic.  Eyes:     General: No scleral icterus.       Right eye: No discharge.        Left eye: No discharge.     Conjunctiva/sclera: Conjunctivae normal.  Cardiovascular:     Rate and Rhythm: Normal rate.  Pulmonary:     Effort: Pulmonary effort is normal. No respiratory distress.  Musculoskeletal:        General: Normal range of motion.     Cervical back: Normal range of motion.  Skin:    General: Skin is warm and dry.     Capillary Refill: Capillary refill takes less than 2 seconds.  Neurological:     General: No focal deficit present.     Mental Status: She is alert and oriented to person, place, and time. Mental status is at baseline.  Psychiatric:        Mood and Affect: Mood normal.        Behavior: Behavior normal.        Thought Content: Thought content normal.        Judgment: Judgment normal.

## 2020-10-10 ENCOUNTER — Ambulatory Visit: Payer: 59 | Admitting: Family

## 2020-10-10 ENCOUNTER — Encounter: Payer: Self-pay | Admitting: Family

## 2020-10-10 ENCOUNTER — Other Ambulatory Visit: Payer: Self-pay

## 2020-10-10 VITALS — BP 117/78 | Temp 97.4°F | Ht 64.0 in | Wt 154.6 lb

## 2020-10-10 DIAGNOSIS — F331 Major depressive disorder, recurrent, moderate: Secondary | ICD-10-CM | POA: Diagnosis not present

## 2020-10-10 DIAGNOSIS — F411 Generalized anxiety disorder: Secondary | ICD-10-CM | POA: Diagnosis not present

## 2020-10-10 DIAGNOSIS — F5101 Primary insomnia: Secondary | ICD-10-CM

## 2020-10-10 DIAGNOSIS — Z23 Encounter for immunization: Secondary | ICD-10-CM | POA: Diagnosis not present

## 2020-10-10 MED ORDER — DESVENLAFAXINE SUCCINATE ER 100 MG PO TB24: 100.0000 mg | ORAL_TABLET | Freq: Every day | ORAL | 1 refills | Status: DC

## 2020-10-10 NOTE — Patient Instructions (Signed)
Major Depressive Disorder, Adult Major depressive disorder (MDD) is a mental health condition. It may also be called clinical depression or unipolar depression. MDD causes symptoms of sadness, hopelessness, and loss of interest in things. These symptoms last most of the day, almost every day, for 2 weeks. MDD can also cause physical symptoms. It can interfere with relationships and with everyday activities,such as work, school, and activities that are usually pleasant. MDD may be mild, moderate, or severe. It may be single-episode MDD, whichhappens once, or recurrent MDD, which may occur multiple times. What are the causes? The exact cause of this condition is not known. MDD is most likely caused by a combination of things, which may include: Your personality traits. Learned or conditioned behaviors or thoughts or feelings that reinforce negativity. Any alcohol or substance misuse. Long-term (chronic) physical or mental health illness. Going through a traumatic experience or major life changes. What increases the risk? The following factors may make someone more likely to develop MDD: A family history of depression. Being a woman. Troubled family relationships. Abnormally low levels of certain brain chemicals. Traumatic or painful events in childhood, especially abuse or loss of a parent. A lot of stress from life experiences, such as poor living conditions or discrimination. Chronic physical illness or other mental health disorders. What are the signs or symptoms? The main symptoms of MDD usually include: Constant depressed or irritable mood. A loss of interest in things and activities. Other symptoms include: Sleeping or eating too much or too little. Unexplained weight gain or weight loss. Tiredness or low energy. Being agitated, restless, or weak. Feeling hopeless, worthless, or guilty. Trouble thinking clearly or making decisions. Thoughts of suicide or thoughts of harming  others. Isolating oneself or avoiding other people or activities. Trouble completing tasks, work, or any normal obligations. Severe symptoms of this condition may include: Psychotic depression.This may include false beliefs, or delusions. It may also include seeing, hearing, tasting, smelling, or feeling things that are not real (hallucinations). Chronic depression or persistent depressive disorder. This is low-level depression that lasts for at least 2 years. Melancholic depression, or feeling extremely sad and hopeless. Catatonic depression, which includes trouble speaking and trouble moving. How is this diagnosed? This condition may be diagnosed based on: Your symptoms. Your medical and mental health history. You may be asked questions about your lifestyle, including any drug and alcohol use. A physical exam. Blood tests to rule out other conditions. MDD is confirmed if you have the following symptoms most of the day, nearly every day, in a 2-week period: Either a depressed mood or loss of interest. At least four other MDD symptoms. How is this treated? This condition is usually treated by mental health professionals, such as psychologists, psychiatrists, and clinical social workers. You may need more than one type of treatment. Treatment may include: Psychotherapy, also called talk therapy or counseling. Types of psychotherapy include: Cognitive behavioral therapy (CBT). This teaches you to recognize unhealthy feelings, thoughts, and behaviors, and replace them with positive thoughts and actions. Interpersonal therapy (IPT). This helps you to improve the way you communicate with others or relate to them. Family therapy. This treatment includes members of your family. Medicines to treat anxiety and depression. These medicines help to balance the brain chemicals that affect your emotions. Lifestyle changes. You may be asked to: Limit alcohol use and avoid drug use. Get regular  exercise. Get plenty of sleep. Make healthy eating choices. Spend more time outdoors. Brain stimulation. This may be done   if symptoms are very severe and other treatments have not worked. Examples of this treatment are electroconvulsive therapy and transcranial magnetic stimulation. Follow these instructions at home: Activity Exercise regularly and spend time outdoors. Find activities that you enjoy doing, and make time to do them. Find healthy ways to manage stress, such as: Meditation or deep breathing. Spending time in nature. Journaling. Return to your normal activities as told by your health care provider. Ask your health care provider what activities are safe for you. Alcohol and drug use If you drink alcohol: Limit how much you use to: 0-1 drink a day for women who are not pregnant. 0-2 drinks a day for men. Be aware of how much alcohol is in your drink. In the [place] one drink equals one 12 oz bottle of beer (355 mL), one 5 oz glass of wine (148 mL), or one 1 oz glass of hard liquor (44 mL). Discuss your alcohol use with your health care provider. Alcohol can affect any antidepressant medicines you are taking. Discuss any drug use with your health care provider. General instructions  Take over-the-counter and prescription medicines only as told by your health care provider. Eat a healthy diet and get plenty of sleep. Consider joining a support group. Your health care provider may be able to recommend one. Keep all follow-up visits as told by your health care provider. This is important.  Where to find more information National Alliance on Mental Illness: www.nami.org [place] National Institute of Mental Health: www.nimh.nih.gov Contact a health care provider if: Your symptoms get worse. You develop new symptoms. Get help right away if: You self-harm. You have serious thoughts about hurting yourself or others. You hallucinate. If you ever feel like you may hurt yourself or  others, or have thoughts about taking your own life, get help right away. Go to your nearest emergency department or: Call your local emergency services (911 in the [place] Call a suicide crisis helpline, such as the National Suicide Prevention Lifeline at 1-800-273-8255. This is open 24 hours a day in the [place] Text the Crisis Text Line at 741741 (in the [place] Summary Major depressive disorder (MDD) is a mental health condition. MDD causes symptoms of sadness, hopelessness, and loss of interest in things. These symptoms last most of the day, almost every day, for 2 weeks. The symptoms of MDD can interfere with relationships and with everyday activities. Treatments and support are available for people who develop MDD. You may need more than one type of treatment. Get help right away if you have serious thoughts about hurting yourself or others. This information is not intended to replace advice given to you by your health care provider. Make sure you discuss any questions you have with your healthcare provider. Document Revised: 12/06/2018 Document Reviewed: 12/06/2018 Elsevier Patient Education  2022 Elsevier Inc.  

## 2020-10-10 NOTE — Progress Notes (Signed)
Subjective:    Patient ID: Phyllis Parker, female    DOB: 1960/12/03, 60 y.o.   MRN: [id number]  Pt presents to the office today for follow up on depression and GAD. She had Gene Sight testing completed and is here to discuss her results.  Anxiety Presents for follow-up visit. Symptoms include depressed mood, excessive worry, insomnia, irritability, nervous/anxious behavior and restlessness. Symptoms occur most days. The severity of symptoms is moderate.    Depression        This is a chronic problem.  The current episode started more than 1 year ago.   The onset quality is gradual.   Associated symptoms include helplessness, hopelessness, insomnia, irritable, restlessness and sad.  Past treatments include SSRIs - Selective serotonin reuptake inhibitors.  Past medical history includes anxiety.   Insomnia Primary symptoms: difficulty falling asleep, frequent awakening.   The current episode started more than one year. The onset quality is gradual. The problem occurs intermittently. PMH includes: depression.      Review of Systems  Constitutional:  Positive for irritability.  Psychiatric/Behavioral:  Positive for depression. The patient is nervous/anxious and has insomnia.   All other systems reviewed and are negative.     Objective:   Physical Exam Vitals reviewed.  Constitutional:      General: She is irritable. She is not in acute distress.    Appearance: She is well-developed.  HENT:     Head: Normocephalic and atraumatic.     Right Ear: Tympanic membrane normal.     Left Ear: Tympanic membrane normal.  Eyes:     Pupils: Pupils are equal, round, and reactive to light.  Neck:     Thyroid: No thyromegaly.  Cardiovascular:     Rate and Rhythm: Normal rate and regular rhythm.     Heart sounds: Normal heart sounds. No murmur heard. Pulmonary:     Effort: Pulmonary effort is normal. No respiratory distress.     Breath sounds: Normal breath sounds. No wheezing.  Abdominal:      General: Bowel sounds are normal. There is no distension.     Palpations: Abdomen is soft.     Tenderness: There is no abdominal tenderness.  Musculoskeletal:        General: No tenderness. Normal range of motion.     Cervical back: Normal range of motion and neck supple.  Skin:    General: Skin is warm and dry.  Neurological:     Mental Status: She is alert and oriented to person, place, and time.     Cranial Nerves: No cranial nerve deficit.     Deep Tendon Reflexes: Reflexes are normal and symmetric.  Psychiatric:        Behavior: Behavior normal.        Thought Content: Thought content normal.        Judgment: Judgment normal.    BP 117/78   Temp (!) 97.4 F (36.3 C) (Temporal)   Ht 5\' 4"  (1.626 m)   Wt 154 lb 9.6 oz (70.1 kg)   BMI 26.54 kg/m        Assessment & Plan:  Lihanna Biever comes in today with chief complaint of Medical Management of Chronic Issues   Diagnosis and orders addressed:  1. Need for immunization against influenza - Flu Vaccine QUAD 63mo+IM (Fluarix, Fluzone & Alfiuria Quad PF)  2. Moderate episode of recurrent major depressive disorder (HCC) Start Pristiq 100 mg  Stress management  RTO in 4 weeks  - desvenlafaxine (PRISTIQ)  100 MG 24 hr tablet; Take 1 tablet (100 mg total) by mouth daily.  Dispense: 90 tablet; Refill: 1  3. GAD (generalized anxiety disorder) - desvenlafaxine (PRISTIQ) 100 MG 24 hr tablet; Take 1 tablet (100 mg total) by mouth daily.  Dispense: 90 tablet; Refill: 1  4. Primary insomnia     Jannifer Rodney, FNP

## 2020-10-17 ENCOUNTER — Other Ambulatory Visit: Payer: Self-pay | Admitting: Family Medicine

## 2020-10-17 ENCOUNTER — Other Ambulatory Visit: Payer: Self-pay | Admitting: Family

## 2020-10-17 DIAGNOSIS — K219 Gastro-esophageal reflux disease without esophagitis: Secondary | ICD-10-CM

## 2020-10-17 DIAGNOSIS — M25561 Pain in right knee: Secondary | ICD-10-CM

## 2020-11-10 ENCOUNTER — Encounter: Payer: Self-pay | Admitting: Family

## 2020-11-10 ENCOUNTER — Other Ambulatory Visit: Payer: Self-pay

## 2020-11-10 ENCOUNTER — Ambulatory Visit: Payer: 59 | Admitting: Family

## 2020-11-10 VITALS — BP 124/84 | HR 82 | Temp 97.9°F | Ht 64.0 in | Wt 150.8 lb

## 2020-11-10 DIAGNOSIS — F411 Generalized anxiety disorder: Secondary | ICD-10-CM | POA: Diagnosis not present

## 2020-11-10 DIAGNOSIS — F331 Major depressive disorder, recurrent, moderate: Secondary | ICD-10-CM | POA: Diagnosis not present

## 2020-11-10 DIAGNOSIS — F5105 Insomnia due to other mental disorder: Secondary | ICD-10-CM

## 2020-11-10 DIAGNOSIS — E039 Hypothyroidism, unspecified: Secondary | ICD-10-CM | POA: Diagnosis not present

## 2020-11-10 DIAGNOSIS — L210 Seborrhea capitis: Secondary | ICD-10-CM

## 2020-11-10 DIAGNOSIS — F99 Mental disorder, not otherwise specified: Secondary | ICD-10-CM

## 2020-11-10 NOTE — Patient Instructions (Signed)
Hyperthyroidism  Hyperthyroidism is when the thyroid gland is too active (overactive). The thyroid gland is a small gland located in the lower front part of the neck, just in front of the windpipe (trachea). This gland makes hormones that help control how the body uses food for energy (metabolism) as well as how the heart and brain function. These hormones also play a role in keeping your bones strong. When the thyroid is overactive, it produces toomuch of a hormone called thyroxine. What are the causes? This condition may be caused by: Graves' disease. This is a disorder in which the body's disease-fighting system (immune system) attacks the thyroid gland. This is the most common cause. Inflammation of the thyroid gland. A tumor in the thyroid gland. Use of certain medicines, including: Prescription thyroid hormone replacement. Herbal supplements that mimic thyroid hormones. Amiodarone therapy. Solid or fluid-filled lumps within your thyroid gland (thyroid nodules). Taking in a large amount of iodine from foods or medicines. What increases the risk? You are more likely to develop this condition if: You are female. You have a family history of thyroid conditions. You smoke tobacco. You use a medicine called lithium. You take medicines that affect the immune system (immunosuppressants). What are the signs or symptoms? Symptoms of this condition include: Nervousness. Inability to tolerate heat. Unexplained weight loss. Diarrhea. Change in the texture of hair or skin. Heart skipping beats or making extra beats. Rapid heart rate. Loss of menstruation. Shaky hands. Fatigue. Restlessness. Sleep problems. Enlarged thyroid gland or a lump in the thyroid (nodule). You may also have symptoms of Graves' disease, which may include: Protruding eyes. Dry eyes. Red or swollen eyes. Problems with vision. How is this diagnosed? This condition may be diagnosed based on: Your symptoms and  medical history. A physical exam. Blood tests. Thyroid ultrasound. This test involves using sound waves to produce images of the thyroid gland. A thyroid scan. A radioactive substance is injected into a vein, and images show how much iodine is present in the thyroid. Radioactive iodine uptake test (RAIU). A small amount of radioactive iodine is given by mouth to see how much iodine the thyroid absorbs after a certain amount of time. How is this treated? Treatment depends on the cause and severity of the condition. Treatment may include: Medicines to reduce the amount of thyroid hormone your body makes. Radioactive iodine treatment (radioiodine therapy). This involves swallowing a small dose of radioactive iodine, in capsule or liquid form, to kill thyroid cells. Surgery to remove part or all of your thyroid gland. You may need to take thyroid hormone replacement medicine for the rest of your life after thyroid surgery. Medicines to help manage your symptoms. Follow these instructions at home:  Take over-the-counter and prescription medicines only as told by your health care provider. Do not use any products that contain nicotine or tobacco, such as cigarettes and e-cigarettes. If you need help quitting, ask your health care provider. Follow any instructions from your health care provider about diet. You may be instructed to limit foods that contain iodine. Keep all follow-up visits as told by your health care provider. This is important. You will need to have blood tests regularly so that your health care provider can monitor your condition. Contact a health care provider if: Your symptoms do not get better with treatment. You have a fever. You are taking thyroid hormone replacement medicine and you: Have symptoms of depression. Feel like you are tired all the time. Gain weight. Get help right   away if: You have chest pain. You have decreased alertness or a change in your awareness. You  have abdominal pain. You feel dizzy. You have a rapid heartbeat. You have an irregular heartbeat. You have difficulty breathing. Summary The thyroid gland is a small gland located in the lower front part of the neck, just in front of the windpipe (trachea). Hyperthyroidism is when the thyroid gland is too active (overactive) and produces too much of a hormone called thyroxine. The most common cause is Graves' disease, a disorder in which your immune system attacks the thyroid gland. Hyperthyroidism can cause various symptoms, such as unexplained weight loss, nervousness, inability to tolerate heat, or changes in your heartbeat. Treatment may include medicine to reduce the amount of thyroid hormone your body makes, radioiodine therapy, surgery, or medicines to manage symptoms. This information is not intended to replace advice given to you by your health care provider. Make sure you discuss any questions you have with your healthcare provider. Document Revised: 09/10/2019 Document Reviewed: 09/10/2019 Elsevier Patient Education  2022 Elsevier Inc.  

## 2020-11-10 NOTE — Progress Notes (Signed)
Subjective:    Patient ID: Phyllis Parker, female    DOB: 11/21/1960, 60 y.o.   MRN: [id number]  Chief Complaint  Patient presents with   Hypothyroidism   Rash    On scalp    Depression    Follow up on pristiq    PT presents to the office today to follow up on depression. She was seen on 10/10/20 and we changed her medications to Pristiq 100 mg. She reports she is doing the best she has in a long time. She is crying less and less anxious.    Rash This is a new problem. The current episode started more than 1 month ago. The problem has been waxing and waning since onset. The affected locations include the scalp. The rash is characterized by itchiness and redness. She was exposed to nothing. Associated symptoms include fatigue. Past treatments include nothing. The treatment provided no relief.  Depression        This is a chronic problem.  The current episode started more than 1 year ago.   The problem occurs daily.  The problem has been gradually improving since onset.  Associated symptoms include fatigue, insomnia, restlessness, decreased interest and sad.  Associated symptoms include no helplessness and no hopelessness.  Past treatments include SNRIs - Serotonin and norepinephrine reuptake inhibitors.  Past medical history includes thyroid problem and anxiety.   Anxiety Presents for follow-up visit. Symptoms include depressed mood, excessive worry, insomnia, irritability, nervous/anxious behavior and restlessness. The severity of symptoms is moderate. The quality of sleep is good.    Insomnia Primary symptoms: difficulty falling asleep, frequent awakening.   The current episode started more than one year. The onset quality is gradual. The problem occurs intermittently. Past treatments include medication. The treatment provided moderate relief. PMH includes: depression.   Thyroid Problem Presents for follow-up visit. Symptoms include anxiety, depressed mood and fatigue. Patient reports no  leg swelling. The symptoms have been stable.     Review of Systems  Constitutional:  Positive for fatigue and irritability.  Skin:  Positive for rash.  Psychiatric/Behavioral:  Positive for depression. The patient is nervous/anxious and has insomnia.   All other systems reviewed and are negative.     Objective:   Physical Exam Vitals reviewed.  Constitutional:      General: She is not in acute distress.    Appearance: She is well-developed.  HENT:     Head: Normocephalic and atraumatic.     Right Ear: Tympanic membrane normal.     Left Ear: Tympanic membrane normal.  Eyes:     Pupils: Pupils are equal, round, and reactive to light.  Neck:     Thyroid: No thyromegaly.  Cardiovascular:     Rate and Rhythm: Normal rate and regular rhythm.     Heart sounds: Normal heart sounds. No murmur heard. Pulmonary:     Effort: Pulmonary effort is normal. No respiratory distress.     Breath sounds: Normal breath sounds. No wheezing.  Abdominal:     General: Bowel sounds are normal. There is no distension.     Palpations: Abdomen is soft.     Tenderness: There is no abdominal tenderness.  Musculoskeletal:        General: No tenderness. Normal range of motion.     Cervical back: Normal range of motion and neck supple.  Skin:    General: Skin is warm and dry.     Comments: Dry scalp and dandruff present  Neurological:  Mental Status: She is alert and oriented to person, place, and time.     Cranial Nerves: No cranial nerve deficit.     Deep Tendon Reflexes: Reflexes are normal and symmetric.  Psychiatric:        Mood and Affect: Mood is anxious.        Behavior: Behavior normal.        Thought Content: Thought content normal.        Judgment: Judgment normal.     BP 124/84   Pulse 82   Temp 97.9 F (36.6 C) (Temporal)   Ht 5\' 4"  (1.626 m)   Wt 150 lb 12.8 oz (68.4 kg)   BMI 25.88 kg/m       Assessment & Plan:  Phyllis Parker comes in today with chief complaint of  Hypothyroidism, Rash (On scalp ), and Depression (Follow up on pristiq )   Diagnosis and orders addressed:  1. Acquired hypothyroidism Labs pending  - TSH  2. Moderate episode of recurrent major depressive disorder (HCC) Continue Pristiq 100 mg  Continue Buspar as needed   3. GAD (generalized anxiety disorder)  4. Insomnia due to other mental disorder  5. Dandruff Start using Head and Shoulders   Labs pending Health Maintenance reviewed Diet and exercise encouraged  Follow up plan: 3 months    Festus Aloe, FNP

## 2020-11-11 LAB — TSH: TSH: 3.9 u[IU]/mL (ref 0.450–4.500)

## 2020-12-02 ENCOUNTER — Other Ambulatory Visit: Payer: Self-pay | Admitting: Family Medicine

## 2020-12-02 ENCOUNTER — Other Ambulatory Visit: Payer: Self-pay | Admitting: Family

## 2020-12-02 DIAGNOSIS — K219 Gastro-esophageal reflux disease without esophagitis: Secondary | ICD-10-CM

## 2020-12-02 DIAGNOSIS — M25561 Pain in right knee: Secondary | ICD-10-CM

## 2020-12-02 DIAGNOSIS — F411 Generalized anxiety disorder: Secondary | ICD-10-CM

## 2020-12-02 DIAGNOSIS — M25562 Pain in left knee: Secondary | ICD-10-CM

## 2020-12-05 ENCOUNTER — Telehealth: Payer: Self-pay | Admitting: Family

## 2020-12-05 NOTE — Telephone Encounter (Signed)
Stop Zoloft and should only be taking Prisq

## 2020-12-05 NOTE — Telephone Encounter (Signed)
Patient aware and verbalized understanding. °

## 2020-12-08 ENCOUNTER — Ambulatory Visit: Payer: 59 | Admitting: Family

## 2020-12-08 ENCOUNTER — Other Ambulatory Visit: Payer: Self-pay

## 2020-12-08 ENCOUNTER — Encounter: Payer: Self-pay | Admitting: Family

## 2020-12-08 VITALS — BP 118/78 | HR 76 | Temp 96.7°F | Ht 64.0 in | Wt 152.0 lb

## 2020-12-08 DIAGNOSIS — Z23 Encounter for immunization: Secondary | ICD-10-CM | POA: Diagnosis not present

## 2020-12-08 DIAGNOSIS — F99 Mental disorder, not otherwise specified: Secondary | ICD-10-CM

## 2020-12-08 DIAGNOSIS — F5101 Primary insomnia: Secondary | ICD-10-CM

## 2020-12-08 DIAGNOSIS — Z79899 Other long term (current) drug therapy: Secondary | ICD-10-CM

## 2020-12-08 DIAGNOSIS — F411 Generalized anxiety disorder: Secondary | ICD-10-CM

## 2020-12-08 DIAGNOSIS — J4520 Mild intermittent asthma, uncomplicated: Secondary | ICD-10-CM

## 2020-12-08 DIAGNOSIS — F331 Major depressive disorder, recurrent, moderate: Secondary | ICD-10-CM

## 2020-12-08 DIAGNOSIS — E039 Hypothyroidism, unspecified: Secondary | ICD-10-CM

## 2020-12-08 DIAGNOSIS — F5105 Insomnia due to other mental disorder: Secondary | ICD-10-CM

## 2020-12-08 DIAGNOSIS — F4322 Adjustment disorder with anxiety: Secondary | ICD-10-CM

## 2020-12-08 DIAGNOSIS — J209 Acute bronchitis, unspecified: Secondary | ICD-10-CM

## 2020-12-08 DIAGNOSIS — R062 Wheezing: Secondary | ICD-10-CM

## 2020-12-08 DIAGNOSIS — J45909 Unspecified asthma, uncomplicated: Secondary | ICD-10-CM | POA: Insufficient documentation

## 2020-12-08 DIAGNOSIS — K219 Gastro-esophageal reflux disease without esophagitis: Secondary | ICD-10-CM | POA: Diagnosis not present

## 2020-12-08 MED ORDER — ALBUTEROL SULFATE HFA 108 (90 BASE) MCG/ACT IN AERS: INHALATION_SPRAY | RESPIRATORY_TRACT | 2 refills | Status: DC

## 2020-12-08 MED ORDER — ALPRAZOLAM 1 MG PO TABS: 1.0000 mg | ORAL_TABLET | Freq: Three times a day (TID) | ORAL | 2 refills | Status: DC | PRN

## 2020-12-08 MED ORDER — ZOLPIDEM TARTRATE 10 MG PO TABS: 10.0000 mg | ORAL_TABLET | Freq: Every evening | ORAL | 5 refills | Status: DC | PRN

## 2020-12-08 MED ORDER — MONTELUKAST SODIUM 10 MG PO TABS: 10.0000 mg | ORAL_TABLET | Freq: Every day | ORAL | 2 refills | Status: DC

## 2020-12-08 MED ORDER — BUDESONIDE-FORMOTEROL FUMARATE 80-4.5 MCG/ACT IN AERO: 2.0000 | INHALATION_SPRAY | Freq: Two times a day (BID) | RESPIRATORY_TRACT | 3 refills | Status: DC

## 2020-12-08 NOTE — Patient Instructions (Signed)

## 2020-12-08 NOTE — Progress Notes (Signed)
Subjective:    Patient ID: Phyllis Eltringham, female    DOB: 1960-01-17, 60 y.o.   MRN: [id number]  Chief Complaint  Patient presents with   Medical Management of Chronic Issues   PT presents to the office today for chronic follow up. We have started her on Pristiq 100 mg and doing well. This has greatly helped her depression and anxiety.  Anxiety Presents for follow-up visit. Symptoms include depressed mood, excessive worry, insomnia, irritability, nervous/anxious behavior and restlessness. Symptoms occur most days. The severity of symptoms is moderate.   Her past medical history is significant for asthma.  Depression        This is a chronic problem.  The current episode started more than 1 year ago.   The problem occurs intermittently.  Associated symptoms include insomnia, irritable, restlessness and sad.  Associated symptoms include no fatigue, no helplessness and no hopelessness.  Past treatments include SNRIs - Serotonin and norepinephrine reuptake inhibitors.  Past medical history includes thyroid problem and anxiety.   Gastroesophageal Reflux She complains of belching, heartburn and wheezing. She reports no coughing. This is a chronic problem. The current episode started more than 1 year ago. The problem occurs occasionally. Pertinent negatives include no fatigue. She has tried a PPI for the symptoms. The treatment provided moderate relief.  Insomnia The current episode started more than one year. The treatment provided moderate relief. PMH includes: depression.   Thyroid Problem Presents for follow-up visit. Symptoms include anxiety and depressed mood. Patient reports no constipation, fatigue or hair loss. The symptoms have been stable.  Asthma She complains of wheezing. There is no cough. This is a chronic problem. Associated symptoms include heartburn. Her symptoms are aggravated by exposure to smoke, emotional stress and change in weather. Her past medical history is significant for  asthma.     Review of Systems  Constitutional:  Positive for irritability. Negative for fatigue.  Respiratory:  Positive for wheezing. Negative for cough.   Gastrointestinal:  Positive for heartburn. Negative for constipation.  Psychiatric/Behavioral:  Positive for depression. The patient is nervous/anxious and has insomnia.   All other systems reviewed and are negative.     Objective:   Physical Exam Vitals reviewed.  Constitutional:      General: She is irritable. She is not in acute distress.    Appearance: She is well-developed.  HENT:     Head: Normocephalic and atraumatic.     Right Ear: Tympanic membrane normal.     Left Ear: Tympanic membrane normal.  Eyes:     Pupils: Pupils are equal, round, and reactive to light.  Neck:     Thyroid: No thyromegaly.  Cardiovascular:     Rate and Rhythm: Normal rate and regular rhythm.     Heart sounds: Normal heart sounds. No murmur heard. Pulmonary:     Effort: Pulmonary effort is normal. No respiratory distress.     Breath sounds: Normal breath sounds. No wheezing.  Abdominal:     General: Bowel sounds are normal. There is no distension.     Palpations: Abdomen is soft.     Tenderness: There is no abdominal tenderness.  Musculoskeletal:        General: No tenderness. Normal range of motion.     Cervical back: Normal range of motion and neck supple.  Skin:    General: Skin is warm and dry.  Neurological:     Mental Status: She is alert and oriented to person, place, and time.  Cranial Nerves: No cranial nerve deficit.     Deep Tendon Reflexes: Reflexes are normal and symmetric.  Psychiatric:        Mood and Affect: Mood is anxious.        Behavior: Behavior normal.        Thought Content: Thought content normal.        Judgment: Judgment normal.      BP 118/78   Pulse 76   Temp (!) 96.7 F (35.9 C) (Temporal)   Ht 5\' 4"  (1.626 m)   Wt 152 lb (68.9 kg)   BMI 26.09 kg/m      Assessment & Plan:  Emmory Solivan  comes in today with chief complaint of Medical Management of Chronic Issues   Diagnosis and orders addressed:  1. Gastroesophageal reflux disease without esophagitis  2. Acquired hypothyroidism  3. Moderate episode of recurrent major depressive disorder (HCC)  4. Insomnia due to other mental disorder  5. GAD (generalized anxiety disorder) - ALPRAZolam (XANAX) 1 MG tablet; Take 1 tablet (1 mg total) by mouth 3 (three) times daily as needed for anxiety.  Dispense: 60 tablet; Refill: 2  6. Primary insomnia - zolpidem (AMBIEN) 10 MG tablet; Take 1 tablet (10 mg total) by mouth at bedtime as needed. for sleep  Dispense: 30 tablet; Refill: 5  7. Controlled substance agreement signed - ALPRAZolam (XANAX) 1 MG tablet; Take 1 tablet (1 mg total) by mouth 3 (three) times daily as needed for anxiety.  Dispense: 60 tablet; Refill: 2 - zolpidem (AMBIEN) 10 MG tablet; Take 1 tablet (10 mg total) by mouth at bedtime as needed. for sleep  Dispense: 30 tablet; Refill: 5  8. Acute bronchitis, unspecified organism - albuterol (VENTOLIN HFA) 108 (90 Base) MCG/ACT inhaler; Inhale 2 puffs into the lungs every 6 hours as needed for wheezing or shortness of breath.  Dispense: 18 g; Refill: 2  9. Adjustment disorder with anxious mood - ALPRAZolam (XANAX) 1 MG tablet; Take 1 tablet (1 mg total) by mouth 3 (three) times daily as needed for anxiety.  Dispense: 60 tablet; Refill: 2  10. Wheeze - budesonide-formoterol (SYMBICORT) 80-4.5 MCG/ACT inhaler; Inhale 2 puffs into the lungs 2 (two) times daily.  Dispense: 1 each; Refill: 3 - montelukast (SINGULAIR) 10 MG tablet; Take 1 tablet (10 mg total) by mouth at bedtime.  Dispense: 90 tablet; Refill: 2  11. Mild intermittent asthma, unspecified whether complicated   Patient reviewed in [place] controlled database, no flags noted. Contract and drug screen are up to date.  Health Maintenance reviewed Diet and exercise encouraged  Follow up plan: 3 months     Festus Aloe, FNP

## 2021-01-25 ENCOUNTER — Encounter: Payer: Self-pay | Admitting: Internal Medicine

## 2021-01-25 ENCOUNTER — Ambulatory Visit: Payer: Self-pay | Admitting: Gastroenterology

## 2021-02-06 ENCOUNTER — Ambulatory Visit
Admission: RE | Admit: 2021-02-06 | Discharge: 2021-02-06 | Disposition: A | Discharge status: Home / Self Care | Payer: 59 | Source: Ambulatory Visit | Attending: Family | Admitting: Family

## 2021-02-06 DIAGNOSIS — Z1231 Encounter for screening mammogram for malignant neoplasm of breast: Secondary | ICD-10-CM

## 2021-02-10 ENCOUNTER — Ambulatory Visit: Payer: 59 | Admitting: Family

## 2021-02-24 ENCOUNTER — Encounter: Payer: Self-pay | Admitting: Family Medicine

## 2021-02-24 ENCOUNTER — Ambulatory Visit (INDEPENDENT_AMBULATORY_CARE_PROVIDER_SITE_OTHER): Payer: 59 | Admitting: Family Medicine

## 2021-02-24 DIAGNOSIS — J4520 Mild intermittent asthma, uncomplicated: Secondary | ICD-10-CM

## 2021-02-24 DIAGNOSIS — J4541 Moderate persistent asthma with (acute) exacerbation: Secondary | ICD-10-CM | POA: Diagnosis not present

## 2021-02-24 MED ORDER — PREDNISONE 20 MG PO TABS: ORAL_TABLET | ORAL | 0 refills | Status: DC

## 2021-02-24 MED ORDER — BENZONATATE 100 MG PO CAPS: 200.0000 mg | ORAL_CAPSULE | Freq: Two times a day (BID) | ORAL | 0 refills | Status: DC | PRN

## 2021-02-24 NOTE — Progress Notes (Signed)
Virtual Visit via telephone Note  I connected with Phyllis Parker on 02/24/21 at 1246 by telephone and verified that I am speaking with the correct person using two identifiers. Phyllis Parker is currently located at home and patient are currently with her during visit. The provider, Fransisca Kaufmann Osei Anger, MD is located in their office at time of visit.  Call ended at 1255  I discussed the limitations, risks, security and privacy concerns of performing an evaluation and management service by telephone and the availability of in person appointments. I also discussed with the patient that there may be a patient responsible charge related to this service. The patient expressed understanding and agreed to proceed.   History and Present Illness: Patient is calling in for sore throat and congestion and chest tightness.  She has used mucinex and inhalers and hurts with coughing. Hurts with deep inspiration. She is very hoarse. She did home covid test and it was negative. She took a cold and flu medicine and it helped some.  She denies fevers or chills. She is on symbicort and albuterol. She also uses singulair.    1. Moderate persistent asthma with exacerbation   2. Mild intermittent asthma, unspecified whether complicated     Outpatient Encounter Medications as of 02/24/2021  Medication Sig   benzonatate (TESSALON PERLES) 100 MG capsule Take 2 capsules (200 mg total) by mouth 2 (two) times daily as needed for cough.   predniSONE (DELTASONE) 20 MG tablet 2 po at same time daily for 5 days   albuterol (VENTOLIN HFA) 108 (90 Base) MCG/ACT inhaler Inhale 2 puffs into the lungs every 6 hours as needed for wheezing or shortness of breath.   ALPRAZolam (XANAX) 1 MG tablet Take 1 tablet (1 mg total) by mouth 3 (three) times daily as needed for anxiety.   atorvastatin (LIPITOR) 10 MG tablet Take 1 tablet (10 mg total) by mouth daily.   budesonide-formoterol (SYMBICORT) 80-4.5 MCG/ACT inhaler Inhale 2 puffs into the  lungs 2 (two) times daily.   busPIRone (BUSPAR) 15 MG tablet Take 1 tablet (15 mg total) by mouth 3 (three) times daily.   desvenlafaxine (PRISTIQ) 100 MG 24 hr tablet Take 1 tablet (100 mg total) by mouth daily.   dicyclomine (BENTYL) 10 MG capsule TAKE 1 CAPSULE FOUR TIMES DAILY BEFORE MEALS AND AT BEDTIME AS NEEDED   esomeprazole (NEXIUM) 40 MG capsule TAKE 1 CAPSULE DAILY AT NOON   gabapentin (NEURONTIN) 100 MG capsule Take 1 to 3 capsules (100-300 mg total) by mouth at bedtime.   levothyroxine (SYNTHROID) 88 MCG tablet Take 1 tablet (88 mcg total) by mouth daily.   meloxicam (MOBIC) 15 MG tablet Take 1 tablet (15 mg total) by mouth daily.   montelukast (SINGULAIR) 10 MG tablet Take 1 tablet (10 mg total) by mouth at bedtime.   SUMAtriptan (IMITREX) 50 MG tablet TAKE 1 OR 2 TABLETS AT ONSET OF MIGRAINE. MAY REPEAT IN 2 HOURS IF NEEDED   zolpidem (AMBIEN) 10 MG tablet Take 1 tablet (10 mg total) by mouth at bedtime as needed. for sleep   No facility-administered encounter medications on file as of 02/24/2021.    Review of Systems  Constitutional:  Negative for chills and fever.  HENT:  Positive for congestion, postnasal drip, rhinorrhea, sore throat and voice change. Negative for ear discharge, ear pain, sinus pressure and sneezing.   Eyes:  Negative for pain, redness and visual disturbance.  Respiratory:  Positive for cough, chest tightness, shortness of breath and  wheezing.   Cardiovascular:  Negative for chest pain and leg swelling.  Genitourinary:  Negative for difficulty urinating and dysuria.  Musculoskeletal:  Negative for back pain and gait problem.  Skin:  Negative for rash.  Neurological:  Negative for light-headedness and headaches.  Psychiatric/Behavioral:  Negative for agitation and behavioral problems.   All other systems reviewed and are negative.  Observations/Objective:  Patient sounds very hoarse but otherwise is able to talk full sentences and in no  distress Assessment and Plan: Problem List Items Addressed This Visit       Respiratory   Asthma   Relevant Medications   predniSONE (DELTASONE) 20 MG tablet   benzonatate (TESSALON PERLES) 100 MG capsule   Other Visit Diagnoses     Moderate persistent asthma with exacerbation    -  Primary   Relevant Medications   predniSONE (DELTASONE) 20 MG tablet   benzonatate (TESSALON PERLES) 100 MG capsule       We will treat like asthma exacerbation.  Will give prednisone and Tessalon Perles and continue to use inhalers. Follow up plan: Return if symptoms worsen or fail to improve.     I discussed the assessment and treatment plan with the patient. The patient was provided an opportunity to ask questions and all were answered. The patient agreed with the plan and demonstrated an understanding of the instructions.   The patient was advised to call back or seek an in-person evaluation if the symptoms worsen or if the condition fails to improve as anticipated.  The above assessment and management plan was discussed with the patient. The patient verbalized understanding of and has agreed to the management plan. Patient is aware to call the clinic if symptoms persist or worsen. Patient is aware when to return to the clinic for a follow-up visit. Patient educated on when it is appropriate to go to the emergency department.    I provided 9 minutes of non-face-to-face time during this encounter.    Worthy Rancher, MD

## 2021-03-10 ENCOUNTER — Encounter: Payer: Self-pay | Admitting: Family

## 2021-03-10 ENCOUNTER — Ambulatory Visit: Payer: 59 | Admitting: Family

## 2021-03-10 VITALS — BP 108/73 | HR 91 | Temp 97.5°F | Ht 64.0 in | Wt 150.4 lb

## 2021-03-10 DIAGNOSIS — F411 Generalized anxiety disorder: Secondary | ICD-10-CM

## 2021-03-10 DIAGNOSIS — F99 Mental disorder, not otherwise specified: Secondary | ICD-10-CM

## 2021-03-10 DIAGNOSIS — F5101 Primary insomnia: Secondary | ICD-10-CM | POA: Diagnosis not present

## 2021-03-10 DIAGNOSIS — Z79899 Other long term (current) drug therapy: Secondary | ICD-10-CM

## 2021-03-10 DIAGNOSIS — K219 Gastro-esophageal reflux disease without esophagitis: Secondary | ICD-10-CM

## 2021-03-10 DIAGNOSIS — F4322 Adjustment disorder with anxiety: Secondary | ICD-10-CM

## 2021-03-10 DIAGNOSIS — E039 Hypothyroidism, unspecified: Secondary | ICD-10-CM

## 2021-03-10 DIAGNOSIS — F5105 Insomnia due to other mental disorder: Secondary | ICD-10-CM

## 2021-03-10 DIAGNOSIS — F331 Major depressive disorder, recurrent, moderate: Secondary | ICD-10-CM

## 2021-03-10 DIAGNOSIS — E785 Hyperlipidemia, unspecified: Secondary | ICD-10-CM | POA: Insufficient documentation

## 2021-03-10 DIAGNOSIS — J4520 Mild intermittent asthma, uncomplicated: Secondary | ICD-10-CM

## 2021-03-10 LAB — CBC WITH DIFFERENTIAL/PLATELET
Basophils Absolute: 0 10*3/uL (ref 0.0–0.2)
Basos: 1 %
EOS (ABSOLUTE): 0 10*3/uL (ref 0.0–0.4)
Eos: 0 %
Hematocrit: 36.2 % (ref 34.0–46.6)
Hemoglobin: 11.9 g/dL (ref 11.1–15.9)
Immature Grans (Abs): 0 10*3/uL (ref 0.0–0.1)
Immature Granulocytes: 0 %
Lymphocytes Absolute: 1.3 10*3/uL (ref 0.7–3.1)
Lymphs: 31 %
MCH: 29.2 pg (ref 26.6–33.0)
MCHC: 32.9 g/dL (ref 31.5–35.7)
MCV: 89 fL (ref 79–97)
Monocytes Absolute: 0.3 10*3/uL (ref 0.1–0.9)
Monocytes: 7 %
Neutrophils Absolute: 2.5 10*3/uL (ref 1.4–7.0)
Neutrophils: 61 %
Platelets: 225 10*3/uL (ref 150–450)
RBC: 4.08 x10E6/uL (ref 3.77–5.28)
RDW: 11.7 % (ref 11.7–15.4)
WBC: 4.1 10*3/uL (ref 3.4–10.8)

## 2021-03-10 LAB — CMP14+EGFR
ALT: 9 IU/L (ref 0–32)
AST: 15 IU/L (ref 0–40)
Albumin/Globulin Ratio: 2 (ref 1.2–2.2)
Albumin: 4.4 g/dL (ref 3.8–4.9)
Alkaline Phosphatase: 80 IU/L (ref 44–121)
BUN/Creatinine Ratio: 12 (ref 9–23)
BUN: 12 mg/dL (ref 6–24)
Bilirubin Total: 0.5 mg/dL (ref 0.0–1.2)
CO2: 24 mmol/L (ref 20–29)
Calcium: 9.3 mg/dL (ref 8.7–10.2)
Chloride: 103 mmol/L (ref 96–106)
Creatinine, Ser: 1.04 mg/dL — ABNORMAL HIGH (ref 0.57–1.00)
Globulin, Total: 2.2 g/dL (ref 1.5–4.5)
Glucose: 88 mg/dL (ref 70–99)
Potassium: 4.1 mmol/L (ref 3.5–5.2)
Sodium: 142 mmol/L (ref 134–144)
Total Protein: 6.6 g/dL (ref 6.0–8.5)
eGFR: 62 mL/min/{1.73_m2} (ref >59)

## 2021-03-10 LAB — TSH: TSH: 0.551 u[IU]/mL (ref 0.450–4.500)

## 2021-03-10 MED ORDER — ZOLPIDEM TARTRATE 10 MG PO TABS: 10.0000 mg | ORAL_TABLET | Freq: Every evening | ORAL | 5 refills | Status: DC | PRN

## 2021-03-10 MED ORDER — CETIRIZINE HCL 10 MG PO TABS: 10.0000 mg | ORAL_TABLET | Freq: Every day | ORAL | 3 refills | Status: DC

## 2021-03-10 MED ORDER — ALPRAZOLAM 1 MG PO TABS: 1.0000 mg | ORAL_TABLET | Freq: Three times a day (TID) | ORAL | 2 refills | Status: DC | PRN

## 2021-03-10 NOTE — Progress Notes (Signed)
? ?Subjective:  ? ? Patient ID: Phyllis Parker, female    DOB: 03/17/1960, 61 y.o.   MRN: [id number] ? ?Chief Complaint  ?Patient presents with  ? Medical Management of Chronic Issues  ?  Reaction to shingles, prednisone was given 2 weeks ago is too much for her. Needs ZDG for certerizine   ? ?PT presents to the office today for chronic follow up. We have started her on Pristiq 100 mg and doing well. This has greatly helped her depression and anxiety.  ?Asthma ?She complains of frequent throat clearing, hoarse voice and wheezing. There is no cough. This is a chronic problem. The current episode started more than 1 year ago. The problem occurs intermittently. The problem has been waxing and waning. Associated symptoms include heartburn. Her symptoms are alleviated by rest. She reports moderate improvement on treatment. Her past medical history is significant for asthma.  ?Gastroesophageal Reflux ?She complains of belching, heartburn, a hoarse voice and wheezing. She reports no coughing. This is a chronic problem. The current episode started more than 1 year ago. The problem occurs occasionally. Associated symptoms include fatigue. She has tried a PPI for the symptoms. The treatment provided moderate relief.  ?Thyroid Problem ?Presents for follow-up visit. Symptoms include anxiety, fatigue, hoarse voice and palpitations. Patient reports no depressed mood, diarrhea or dry skin. The symptoms have been stable. Her past medical history is significant for hyperlipidemia.  ?Insomnia ?Primary symptoms: difficulty falling asleep, frequent awakening.   ?The current episode started more than one year. The onset quality is gradual. The problem occurs intermittently. PMH includes: depression.   ?Anxiety ?Presents for follow-up visit. Symptoms include decreased concentration, excessive worry, insomnia, irritability, nervous/anxious behavior, palpitations and restlessness. Patient reports no depressed mood. Symptoms occur occasionally.  The severity of symptoms is moderate.  ? ?Her past medical history is significant for asthma.  ?Depression ?       This is a chronic problem.  The current episode started more than 1 year ago.   The onset quality is gradual.   The problem occurs intermittently.  Associated symptoms include decreased concentration, fatigue, insomnia, irritable, restlessness and sad.  Associated symptoms include no helplessness and no hopelessness.  Past treatments include SNRIs - Serotonin and norepinephrine reuptake inhibitors.  Past medical history includes thyroid problem and anxiety.   ?Hyperlipidemia ?This is a chronic problem. The current episode started more than 1 year ago. The problem is controlled. Recent lipid tests were reviewed and are normal. Current antihyperlipidemic treatment includes statins. The current treatment provides moderate improvement of lipids. Risk factors for coronary artery disease include dyslipidemia, hypertension, a sedentary lifestyle and post-menopausal.  ? ? ? ?Review of Systems  ?Constitutional:  Positive for fatigue and irritability.  ?HENT:  Positive for hoarse voice.   ?Respiratory:  Positive for wheezing. Negative for cough.   ?Cardiovascular:  Positive for palpitations.  ?Gastrointestinal:  Positive for heartburn. Negative for diarrhea.  ?Psychiatric/Behavioral:  Positive for decreased concentration and depression. The patient is nervous/anxious and has insomnia.   ?All other systems reviewed and are negative. ? ?   ?Objective:  ? Physical Exam ?Vitals reviewed.  ?Constitutional:   ?   General: She is irritable. She is not in acute distress. ?   Appearance: She is well-developed.  ?HENT:  ?   Head: Normocephalic and atraumatic.  ?   Right Ear: Tympanic membrane normal.  ?   Left Ear: Tympanic membrane normal.  ?Eyes:  ?   Pupils: Pupils are equal, round, and  reactive to light.  ?Neck:  ?   Thyroid: No thyromegaly.  ?Cardiovascular:  ?   Rate and Rhythm: Normal rate and regular rhythm.  ?    Heart sounds: Normal heart sounds. No murmur heard. ?Pulmonary:  ?   Effort: Pulmonary effort is normal. No respiratory distress.  ?   Breath sounds: Normal breath sounds. No wheezing.  ?Abdominal:  ?   General: Bowel sounds are normal. There is no distension.  ?   Palpations: Abdomen is soft.  ?   Tenderness: There is no abdominal tenderness.  ?Musculoskeletal:     ?   General: No tenderness. Normal range of motion.  ?   Cervical back: Normal range of motion and neck supple.  ?Skin: ?   General: Skin is warm and dry.  ?Neurological:  ?   Mental Status: She is alert and oriented to person, place, and time.  ?   Cranial Nerves: No cranial nerve deficit.  ?   Deep Tendon Reflexes: Reflexes are normal and symmetric.  ?Psychiatric:     ?   Mood and Affect: Mood is anxious.     ?   Behavior: Behavior normal.     ?   Thought Content: Thought content normal.     ?   Judgment: Judgment normal.  ? ? ? ?BP 108/73   Pulse 91   Temp (!) 97.5 ?F (36.4 ?C) (Temporal)   Ht 5' 4"  (1.626 m)   Wt 150 lb 6.4 oz (68.2 kg)   BMI 25.82 kg/m?  ? ? ?   ?Assessment & Plan:  ?Rossanna Spitzley comes in today with chief complaint of Medical Management of Chronic Issues (Reaction to shingles, prednisone was given 2 weeks ago is too much for her. Needs YBW for certerizine ) ? ? ?Diagnosis and orders addressed: ? ?1. GAD (generalized anxiety disorder) ? ?- ALPRAZolam (XANAX) 1 MG tablet; Take 1 tablet (1 mg total) by mouth 3 (three) times daily as needed for anxiety.  Dispense: 60 tablet; Refill: 2 ?- CMP14+EGFR ?- CBC with Differential/Platelet ? ?2. Controlled substance agreement signed ?- ALPRAZolam (XANAX) 1 MG tablet; Take 1 tablet (1 mg total) by mouth 3 (three) times daily as needed for anxiety.  Dispense: 60 tablet; Refill: 2 ?- zolpidem (AMBIEN) 10 MG tablet; Take 1 tablet (10 mg total) by mouth at bedtime as needed. for sleep  Dispense: 30 tablet; Refill: 5 ?- CMP14+EGFR ?- CBC with Differential/Platelet ? ?3. Adjustment disorder with  anxious mood ?- ALPRAZolam (XANAX) 1 MG tablet; Take 1 tablet (1 mg total) by mouth 3 (three) times daily as needed for anxiety.  Dispense: 60 tablet; Refill: 2 ?- CMP14+EGFR ?- CBC with Differential/Platelet ? ?4. Primary insomnia ?- zolpidem (AMBIEN) 10 MG tablet; Take 1 tablet (10 mg total) by mouth at bedtime as needed. for sleep  Dispense: 30 tablet; Refill: 5 ?- CMP14+EGFR ?- CBC with Differential/Platelet ? ?5. Mild intermittent asthma, unspecified whether complicated ?- LSL37+DSKA ?- CBC with Differential/Platelet ? ?6. Gastroesophageal reflux disease without esophagitis ?- CMP14+EGFR ?- CBC with Differential/Platelet ? ?7. Acquired hypothyroidism ?- CMP14+EGFR ?- CBC with Differential/Platelet ?- TSH ? ?8. Moderate episode of recurrent major depressive disorder ([place]) ?- CMP14+EGFR ?- CBC with Differential/Platelet ? ?9. Insomnia due to other mental disorder ?- CMP14+EGFR ?- CBC with Differential/Platelet ? ?10. Hyperlipidemia, unspecified hyperlipidemia type ?- CMP14+EGFR ?- CBC with Differential/Platelet ? ? ?Labs pending ?Patient reviewed in [place] controlled database, no flags noted. Contract and drug screen are up to date on  09/06/21. ?Health Maintenance reviewed ?Diet and exercise encouraged ? ?Follow up plan: ?3 months  ? ? ?Evelina Dun, FNP ? ?

## 2021-03-10 NOTE — Patient Instructions (Signed)

## 2021-03-15 ENCOUNTER — Ambulatory Visit: Payer: Self-pay

## 2021-04-06 ENCOUNTER — Other Ambulatory Visit: Payer: Self-pay | Admitting: Family

## 2021-04-06 DIAGNOSIS — M25561 Pain in right knee: Secondary | ICD-10-CM

## 2021-05-08 ENCOUNTER — Other Ambulatory Visit: Payer: Self-pay | Admitting: Family

## 2021-05-08 DIAGNOSIS — M25561 Pain in right knee: Secondary | ICD-10-CM

## 2021-05-22 ENCOUNTER — Telehealth: Payer: Self-pay | Admitting: Family

## 2021-05-22 DIAGNOSIS — Z79899 Other long term (current) drug therapy: Secondary | ICD-10-CM

## 2021-05-22 DIAGNOSIS — F411 Generalized anxiety disorder: Secondary | ICD-10-CM

## 2021-05-22 DIAGNOSIS — F4322 Adjustment disorder with anxiety: Secondary | ICD-10-CM

## 2021-05-22 MED ORDER — ALPRAZOLAM 2 MG PO TABS: 1.0000 mg | ORAL_TABLET | Freq: Two times a day (BID) | ORAL | 1 refills | Status: DC | PRN

## 2021-05-22 NOTE — Telephone Encounter (Signed)
Xanax Prescription sent to pharmacy.  ? ?Jannifer Rodney, FNP ? ?

## 2021-06-06 ENCOUNTER — Encounter: Payer: Self-pay | Admitting: Family

## 2021-06-06 ENCOUNTER — Ambulatory Visit: Payer: 59 | Admitting: Family

## 2021-06-06 VITALS — BP 136/88 | HR 84 | Temp 98.2°F | Ht 64.0 in | Wt 154.0 lb

## 2021-06-06 DIAGNOSIS — E785 Hyperlipidemia, unspecified: Secondary | ICD-10-CM

## 2021-06-06 DIAGNOSIS — F331 Major depressive disorder, recurrent, moderate: Secondary | ICD-10-CM

## 2021-06-06 DIAGNOSIS — E039 Hypothyroidism, unspecified: Secondary | ICD-10-CM | POA: Diagnosis not present

## 2021-06-06 DIAGNOSIS — F5101 Primary insomnia: Secondary | ICD-10-CM

## 2021-06-06 DIAGNOSIS — F5105 Insomnia due to other mental disorder: Secondary | ICD-10-CM | POA: Diagnosis not present

## 2021-06-06 DIAGNOSIS — K219 Gastro-esophageal reflux disease without esophagitis: Secondary | ICD-10-CM | POA: Diagnosis not present

## 2021-06-06 DIAGNOSIS — F99 Mental disorder, not otherwise specified: Secondary | ICD-10-CM

## 2021-06-06 DIAGNOSIS — F411 Generalized anxiety disorder: Secondary | ICD-10-CM

## 2021-06-06 DIAGNOSIS — J4520 Mild intermittent asthma, uncomplicated: Secondary | ICD-10-CM | POA: Diagnosis not present

## 2021-06-06 DIAGNOSIS — M545 Low back pain, unspecified: Secondary | ICD-10-CM

## 2021-06-06 DIAGNOSIS — Z79899 Other long term (current) drug therapy: Secondary | ICD-10-CM

## 2021-06-06 MED ORDER — PREDNISONE 10 MG (21) PO TBPK: ORAL_TABLET | ORAL | 0 refills | Status: DC

## 2021-06-06 MED ORDER — ALPRAZOLAM 2 MG PO TABS: 1.0000 mg | ORAL_TABLET | Freq: Two times a day (BID) | ORAL | 2 refills | Status: DC | PRN

## 2021-06-06 MED ORDER — ZOLPIDEM TARTRATE 10 MG PO TABS: 10.0000 mg | ORAL_TABLET | Freq: Every evening | ORAL | 2 refills | Status: DC | PRN

## 2021-06-06 MED ORDER — PREDNISONE 20 MG PO TABS: 40.0000 mg | ORAL_TABLET | Freq: Every day | ORAL | 0 refills | Status: DC

## 2021-06-06 NOTE — Progress Notes (Signed)
Subjective:    Patient ID: Phyllis Parker, female    DOB: 01-16-1960, 61 y.o.   MRN: [id number]  Chief Complaint  Patient presents with   Medical Management of Chronic Issues   Gastroesophageal Reflux   Hypothyroidism   Anxiety   PT presents to the office today for chronic follow up. She reports her GAD and depression is worse and quit her job.  Gastroesophageal Reflux She complains of belching, coughing, heartburn and a hoarse voice. She reports no wheezing. This is a chronic problem. The current episode started more than 1 year ago. The problem occurs occasionally. The problem has been waxing and waning. Associated symptoms include fatigue. Risk factors include obesity. She has tried a PPI for the symptoms. The treatment provided moderate relief.  Anxiety Presents for follow-up visit. Symptoms include excessive worry, insomnia, irritability, nervous/anxious behavior and restlessness. Patient reports no shortness of breath or suicidal ideas. Symptoms occur occasionally. The severity of symptoms is moderate.   Her past medical history is significant for asthma.  Asthma She complains of cough and hoarse voice. There is no shortness of breath or wheezing. This is a chronic problem. The current episode started more than 1 year ago. The problem occurs intermittently. Associated symptoms include heartburn. Her symptoms are aggravated by pollen. Her symptoms are alleviated by rest. Her past medical history is significant for asthma.  Thyroid Problem Presents for follow-up visit. Symptoms include anxiety, cold intolerance, dry skin, fatigue, hair loss and hoarse voice. Patient reports no constipation or diarrhea. The symptoms have been worsening. Her past medical history is significant for hyperlipidemia.  Insomnia Primary symptoms: difficulty falling asleep, frequent awakening.   The current episode started more than one year. The onset quality is gradual. The problem occurs intermittently. PMH  includes: depression.   Hyperlipidemia This is a chronic problem. The current episode started more than 1 year ago. The problem is controlled. Recent lipid tests were reviewed and are normal. Pertinent negatives include no shortness of breath. Current antihyperlipidemic treatment includes statins. The current treatment provides moderate improvement of lipids. Risk factors for coronary artery disease include dyslipidemia, hypertension, a sedentary lifestyle and post-menopausal.  Depression        This is a chronic problem.  The current episode started more than 1 year ago.   Associated symptoms include fatigue, helplessness, hopelessness, insomnia, irritable, restlessness, decreased interest and sad.  Associated symptoms include no suicidal ideas.  Past treatments include SNRIs - Serotonin and norepinephrine reuptake inhibitors.  Past medical history includes thyroid problem and anxiety.   Back Pain This is a new problem. The current episode started more than 1 month ago. The problem has been waxing and waning since onset. The pain is present in the lumbar spine. The quality of the pain is described as aching. The pain is at a severity of 4/10. The pain is moderate. She has tried ice, bed rest, heat, home exercises and NSAIDs for the symptoms. The treatment provided mild relief.     Review of Systems  Constitutional:  Positive for fatigue and irritability.  HENT:  Positive for hoarse voice.   Respiratory:  Positive for cough. Negative for shortness of breath and wheezing.   Gastrointestinal:  Positive for heartburn. Negative for constipation and diarrhea.  Endocrine: Positive for cold intolerance.  Musculoskeletal:  Positive for back pain.  Psychiatric/Behavioral:  Positive for depression. Negative for suicidal ideas. The patient is nervous/anxious and has insomnia.   All other systems reviewed and are negative.  Objective:   Physical Exam Vitals reviewed.  Constitutional:      General:  She is irritable. She is not in acute distress.    Appearance: She is well-developed.  HENT:     Head: Normocephalic and atraumatic.     Right Ear: Tympanic membrane normal.     Left Ear: Tympanic membrane normal.  Eyes:     Pupils: Pupils are equal, round, and reactive to light.  Neck:     Thyroid: No thyromegaly.  Cardiovascular:     Rate and Rhythm: Normal rate and regular rhythm.     Heart sounds: Normal heart sounds. No murmur heard. Pulmonary:     Effort: Pulmonary effort is normal. No respiratory distress.     Breath sounds: Normal breath sounds. No wheezing.  Abdominal:     General: Bowel sounds are normal. There is no distension.     Palpations: Abdomen is soft.     Tenderness: There is no abdominal tenderness.  Musculoskeletal:        General: No tenderness. Normal range of motion.     Cervical back: Normal range of motion and neck supple.  Skin:    General: Skin is warm and dry.  Neurological:     Mental Status: She is alert and oriented to person, place, and time.     Cranial Nerves: No cranial nerve deficit.     Deep Tendon Reflexes: Reflexes are normal and symmetric.  Psychiatric:        Mood and Affect: Mood is anxious. Affect is tearful.        Behavior: Behavior normal.        Thought Content: Thought content normal.        Judgment: Judgment normal.     BP 136/88   Pulse 84   Temp 98.2 F (36.8 C) (Temporal)   Ht _0  (1.626 m)   Wt 154 lb (69.9 kg)   SpO2 99%   BMI 26.43 kg/m       Assessment & Plan:  Phyllis Parker comes in today with chief complaint of Medical Management of Chronic Issues, Gastroesophageal Reflux, Hypothyroidism, and Anxiety   Diagnosis and orders addressed:  1. Mild intermittent asthma, unspecified whether complicated - XTK24+OXBD  2. Gastroesophageal reflux disease without esophagitis - CMP14+EGFR  3. Acquired hypothyroidism - CMP14+EGFR - TSH  4. Insomnia due to other mental disorder - CMP14+EGFR - Ambulatory  referral to Psychiatry  5. Hyperlipidemia, unspecified hyperlipidemia type - CMP14+EGFR  6. GAD (generalized anxiety disorder) - alprazolam (XANAX) 2 MG tablet; Take 0.5 tablets (1 mg total) by mouth 2 (two) times daily as needed for sleep.  Dispense: 30 tablet; Refill: 2 - CMP14+EGFR - Ambulatory referral to Psychiatry  7. Moderate episode of recurrent major depressive disorder ([place]) - CMP14+EGFR - Ambulatory referral to Psychiatry  8. Controlled substance agreement signed  - alprazolam (XANAX) 2 MG tablet; Take 0.5 tablets (1 mg total) by mouth 2 (two) times daily as needed for sleep.  Dispense: 30 tablet; Refill: 2 - zolpidem (AMBIEN) 10 MG tablet; Take 1 tablet (10 mg total) by mouth at bedtime as needed. for sleep  Dispense: 30 tablet; Refill: 2 - CMP14+EGFR  9. Primary insomnia  - zolpidem (AMBIEN) 10 MG tablet; Take 1 tablet (10 mg total) by mouth at bedtime as needed. for sleep  Dispense: 30 tablet; Refill: 2 - CMP14+EGFR - Ambulatory referral to Psychiatry  10. Acute right-sided low back pain without sciatica - predniSONE (DELTASONE) 20 MG tablet; Take 2  tablets (40 mg total) by mouth daily with breakfast for 5 days.  Dispense: 10 tablet; Refill: 0   Labs pending Patient reviewed in [place] controlled database, no flags noted. Contract and drug screen are up to date 09/06/20. Referral to [facility] placed. PT very tearful today.  Health Maintenance reviewed Diet and exercise encouraged  Follow up plan: 3 months   Evelina Dun, FNP

## 2021-06-06 NOTE — Patient Instructions (Signed)
Acute Back Pain, Adult Acute back pain is sudden and usually short-lived. It is often caused by an injury to the muscles and tissues in the back. The injury may result from: A muscle, tendon, or ligament getting overstretched or torn. Ligaments are tissues that connect bones to each other. Lifting something improperly can cause a back strain. Wear and tear (degeneration) of the spinal disks. Spinal disks are circular tissue that provide cushioning between the bones of the spine (vertebrae). Twisting motions, such as while playing sports or doing yard work. A hit to the back. Arthritis. You may have a physical exam, lab tests, and imaging tests to find the cause of your pain. Acute back pain usually goes away with rest and home care. Follow these instructions at home: Managing pain, stiffness, and swelling Take over-the-counter and prescription medicines only as told by your health care provider. Treatment may include medicines for pain and inflammation that are taken by mouth or applied to the skin, or muscle relaxants. Your health care provider may recommend applying ice during the first 24-48 hours after your pain starts. To do this: Put ice in a plastic bag. Place a towel between your skin and the bag. Leave the ice on for 20 minutes, 2-3 times a day. Remove the ice if your skin turns bright red. This is very important. If you cannot feel pain, heat, or cold, you have a greater risk of damage to the area. If directed, apply heat to the affected area as often as told by your health care provider. Use the heat source that your health care provider recommends, such as a moist heat pack or a heating pad. Place a towel between your skin and the heat source. Leave the heat on for 20-30 minutes. Remove the heat if your skin turns bright red. This is especially important if you are unable to feel pain, heat, or cold. You have a greater risk of getting burned. Activity  Do not stay in bed. Staying in  bed for more than 1-2 days can delay your recovery. Sit up and stand up straight. Avoid leaning forward when you sit or hunching over when you stand. If you work at a desk, sit close to it so you do not need to lean over. Keep your chin tucked in. Keep your neck drawn back, and keep your elbows bent at a 90-degree angle (right angle). Sit high and close to the steering wheel when you drive. Add lower back (lumbar) support to your car seat, if needed. Take short walks on even surfaces as soon as you are able. Try to increase the length of time you walk each day. Do not sit, drive, or stand in one place for more than 30 minutes at a time. Sitting or standing for long periods of time can put stress on your back. Do not drive or use heavy machinery while taking prescription pain medicine. Use proper lifting techniques. When you bend and lift, use positions that put less stress on your back: Bend your knees. Keep the load close to your body. Avoid twisting. Exercise regularly as told by your health care provider. Exercising helps your back heal faster and helps prevent back injuries by keeping muscles strong and flexible. Work with a physical therapist to make a safe exercise program, as recommended by your health care provider. Do any exercises as told by your physical therapist. Lifestyle Maintain a healthy weight. Extra weight puts stress on your back and makes it difficult to have good   posture. Avoid activities or situations that make you feel anxious or stressed. Stress and anxiety increase muscle tension and can make back pain worse. Learn ways to manage anxiety and stress, such as through exercise. General instructions Sleep on a firm mattress in a comfortable position. Try lying on your side with your knees slightly bent. If you lie on your back, put a pillow under your knees. Keep your head and neck in a straight line with your spine (neutral position) when using electronic equipment like  smartphones or pads. To do this: Raise your smartphone or pad to look at it instead of bending your head or neck to look down. Put the smartphone or pad at the level of your face while looking at the screen. Follow your treatment plan as told by your health care provider. This may include: Cognitive or behavioral therapy. Acupuncture or massage therapy. Meditation or yoga. Contact a health care provider if: You have pain that is not relieved with rest or medicine. You have increasing pain going down into your legs or buttocks. Your pain does not improve after 2 weeks. You have pain at night. You lose weight without trying. You have a fever or chills. You develop nausea or vomiting. You develop abdominal pain. Get help right away if: You develop new bowel or bladder control problems. You have unusual weakness or numbness in your arms or legs. You feel faint. These symptoms may represent a serious problem that is an emergency. Do not wait to see if the symptoms will go away. Get medical help right away. Call your local emergency services (911 in the [place] Do not drive yourself to the hospital. Summary Acute back pain is sudden and usually short-lived. Use proper lifting techniques. When you bend and lift, use positions that put less stress on your back. Take over-the-counter and prescription medicines only as told by your health care provider, and apply heat or ice as told. This information is not intended to replace advice given to you by your health care provider. Make sure you discuss any questions you have with your health care provider. Document Revised: 03/18/2020 Document Reviewed: 03/18/2020 Elsevier Patient Education  2023 [employer]  

## 2021-06-07 LAB — CMP14+EGFR
ALT: 14 IU/L (ref 0–32)
AST: 23 IU/L (ref 0–40)
Albumin/Globulin Ratio: 2.3 — ABNORMAL HIGH (ref 1.2–2.2)
Albumin: 4.5 g/dL (ref 3.8–4.9)
Alkaline Phosphatase: 73 IU/L (ref 44–121)
BUN/Creatinine Ratio: 16 (ref 9–23)
BUN: 13 mg/dL (ref 6–24)
Bilirubin Total: 0.3 mg/dL (ref 0.0–1.2)
CO2: 24 mmol/L (ref 20–29)
Calcium: 9.3 mg/dL (ref 8.7–10.2)
Chloride: 105 mmol/L (ref 96–106)
Creatinine, Ser: 0.8 mg/dL (ref 0.57–1.00)
Globulin, Total: 2 g/dL (ref 1.5–4.5)
Glucose: 68 mg/dL — ABNORMAL LOW (ref 70–99)
Potassium: 4 mmol/L (ref 3.5–5.2)
Sodium: 142 mmol/L (ref 134–144)
Total Protein: 6.5 g/dL (ref 6.0–8.5)
eGFR: 85 mL/min/{1.73_m2} (ref >59)

## 2021-06-07 LAB — TSH: TSH: 3.38 u[IU]/mL (ref 0.450–4.500)

## 2021-06-19 ENCOUNTER — Other Ambulatory Visit: Payer: Self-pay | Admitting: Family

## 2021-06-19 DIAGNOSIS — F411 Generalized anxiety disorder: Secondary | ICD-10-CM

## 2021-06-19 DIAGNOSIS — F331 Major depressive disorder, recurrent, moderate: Secondary | ICD-10-CM

## 2021-06-19 DIAGNOSIS — M25562 Pain in left knee: Secondary | ICD-10-CM

## 2021-08-02 ENCOUNTER — Other Ambulatory Visit: Payer: Self-pay | Admitting: Family

## 2021-08-02 DIAGNOSIS — M25561 Pain in right knee: Secondary | ICD-10-CM

## 2021-08-30 ENCOUNTER — Other Ambulatory Visit: Payer: Self-pay | Admitting: Family

## 2021-08-30 DIAGNOSIS — M25561 Pain in right knee: Secondary | ICD-10-CM

## 2021-09-07 ENCOUNTER — Other Ambulatory Visit: Payer: Self-pay | Admitting: Family

## 2021-09-07 ENCOUNTER — Encounter: Payer: Self-pay | Admitting: Family

## 2021-09-07 ENCOUNTER — Ambulatory Visit: Payer: 59 | Admitting: Family

## 2021-09-07 VITALS — BP 125/82 | HR 92 | Temp 98.1°F | Ht 64.0 in | Wt 149.0 lb

## 2021-09-07 DIAGNOSIS — H8110 Benign paroxysmal vertigo, unspecified ear: Secondary | ICD-10-CM

## 2021-09-07 DIAGNOSIS — F5105 Insomnia due to other mental disorder: Secondary | ICD-10-CM

## 2021-09-07 DIAGNOSIS — M25562 Pain in left knee: Secondary | ICD-10-CM

## 2021-09-07 DIAGNOSIS — F331 Major depressive disorder, recurrent, moderate: Secondary | ICD-10-CM

## 2021-09-07 DIAGNOSIS — E785 Hyperlipidemia, unspecified: Secondary | ICD-10-CM

## 2021-09-07 DIAGNOSIS — E039 Hypothyroidism, unspecified: Secondary | ICD-10-CM

## 2021-09-07 DIAGNOSIS — J4520 Mild intermittent asthma, uncomplicated: Secondary | ICD-10-CM | POA: Diagnosis not present

## 2021-09-07 DIAGNOSIS — F411 Generalized anxiety disorder: Secondary | ICD-10-CM

## 2021-09-07 DIAGNOSIS — F99 Mental disorder, not otherwise specified: Secondary | ICD-10-CM

## 2021-09-07 DIAGNOSIS — K219 Gastro-esophageal reflux disease without esophagitis: Secondary | ICD-10-CM

## 2021-09-07 DIAGNOSIS — M25561 Pain in right knee: Secondary | ICD-10-CM | POA: Diagnosis not present

## 2021-09-07 DIAGNOSIS — E538 Deficiency of other specified B group vitamins: Secondary | ICD-10-CM

## 2021-09-07 DIAGNOSIS — Z79899 Other long term (current) drug therapy: Secondary | ICD-10-CM | POA: Diagnosis not present

## 2021-09-07 DIAGNOSIS — F5101 Primary insomnia: Secondary | ICD-10-CM

## 2021-09-07 MED ORDER — ALPRAZOLAM 2 MG PO TABS: 1.0000 mg | ORAL_TABLET | Freq: Two times a day (BID) | ORAL | 2 refills | Status: DC | PRN

## 2021-09-07 MED ORDER — MELOXICAM 15 MG PO TABS: 15.0000 mg | ORAL_TABLET | Freq: Every day | ORAL | 0 refills | Status: DC

## 2021-09-07 MED ORDER — CYANOCOBALAMIN 1000 MCG/ML IJ SOLN: 1000.0000 ug | INTRAMUSCULAR | 5 refills | Status: AC

## 2021-09-07 MED ORDER — ZOLPIDEM TARTRATE 10 MG PO TABS: 10.0000 mg | ORAL_TABLET | Freq: Every evening | ORAL | 2 refills | Status: DC | PRN

## 2021-09-07 NOTE — Progress Notes (Signed)
Subjective:    Patient ID: Phyllis Parker, female    DOB: 1960/03/07, 61 y.o.   MRN: [id number]  Chief Complaint  Patient presents with   Medical Management of Chronic Issues   PT presents to the office today for chronic follow up. She reports her GAD and depression is slightly improved and quit her job.   Has a lot of financial stress. Asthma She complains of cough, hoarse voice and wheezing. This is a chronic problem. The current episode started more than 1 year ago. The problem occurs intermittently. The problem has been waxing and waning. The cough is non-productive. Associated symptoms include heartburn. She reports moderate improvement on treatment. Her past medical history is significant for asthma.  Gastroesophageal Reflux She complains of belching, coughing, heartburn, a hoarse voice and wheezing. This is a chronic problem. The current episode started more than 1 year ago. The problem occurs occasionally. Associated symptoms include fatigue. She has tried a PPI for the symptoms. The treatment provided moderate relief.  Thyroid Problem Presents for follow-up visit. Symptoms include anxiety, depressed mood, fatigue, hoarse voice and palpitations. The symptoms have been stable. Her past medical history is significant for hyperlipidemia.  Dizziness This is a chronic problem. The current episode started more than 1 year ago. The problem occurs intermittently. The problem has been waxing and waning. Associated symptoms include coughing and fatigue. The symptoms are aggravated by twisting. The treatment provided mild relief.  Insomnia Primary symptoms: difficulty falling asleep, frequent awakening.   The current episode started more than one year. The onset quality is gradual. The problem occurs intermittently. The treatment provided moderate relief. PMH includes: depression.   Hyperlipidemia This is a chronic problem. The current episode started more than 1 year ago. The problem is  controlled. Recent lipid tests were reviewed and are normal. Exacerbating diseases include obesity. Current antihyperlipidemic treatment includes statins. The current treatment provides moderate improvement of lipids. Risk factors for coronary artery disease include dyslipidemia, hypertension, a sedentary lifestyle and post-menopausal.  Anxiety Presents for follow-up visit. Symptoms include depressed mood, dizziness, excessive worry, insomnia, irritability, nervous/anxious behavior, obsessions, palpitations and restlessness. The severity of symptoms is moderate.   Her past medical history is significant for asthma.  Depression        This is a chronic problem.  The current episode started more than 1 year ago.   Associated symptoms include fatigue, insomnia, irritable, restlessness and sad.  Associated symptoms include no helplessness and no hopelessness.  Past treatments include SNRIs - Serotonin and norepinephrine reuptake inhibitors.  Past medical history includes thyroid problem and anxiety.       Review of Systems  Constitutional:  Positive for fatigue and irritability.  HENT:  Positive for hoarse voice.   Respiratory:  Positive for cough and wheezing.   Cardiovascular:  Positive for palpitations.  Gastrointestinal:  Positive for heartburn.  Neurological:  Positive for dizziness.  Psychiatric/Behavioral:  Positive for depression. The patient is nervous/anxious and has insomnia.   All other systems reviewed and are negative.      Objective:   Physical Exam Vitals reviewed.  Constitutional:      General: She is irritable. She is not in acute distress.    Appearance: She is well-developed. She is obese.  HENT:     Head: Normocephalic and atraumatic.     Right Ear: Tympanic membrane normal.     Left Ear: Tympanic membrane normal.  Eyes:     Pupils: Pupils are equal, round, and reactive to  light.  Neck:     Thyroid: No thyromegaly.  Cardiovascular:     Rate and Rhythm: Normal  rate and regular rhythm.     Heart sounds: Normal heart sounds. No murmur heard. Pulmonary:     Effort: Pulmonary effort is normal. No respiratory distress.     Breath sounds: Normal breath sounds. No wheezing.  Abdominal:     General: Bowel sounds are normal. There is no distension.     Palpations: Abdomen is soft.     Tenderness: There is no abdominal tenderness.  Musculoskeletal:        General: No tenderness. Normal range of motion.     Cervical back: Normal range of motion and neck supple.  Skin:    General: Skin is warm and dry.  Neurological:     Mental Status: She is alert and oriented to person, place, and time.     Cranial Nerves: No cranial nerve deficit.     Deep Tendon Reflexes: Reflexes are normal and symmetric.  Psychiatric:        Mood and Affect: Affect is tearful.        Behavior: Behavior normal.        Thought Content: Thought content normal.        Judgment: Judgment normal.     BP 125/82   Pulse 92   Temp 98.1 F (36.7 C) (Temporal)   Ht 5\' 4"  (1.626 m)   Wt 149 lb (67.6 kg)   SpO2 97%   BMI 25.58 kg/m        Assessment & Plan:   Phyllis Parker comes in today with chief complaint of Medical Management of Chronic Issues   Diagnosis and orders addressed:  1. Arthralgia of both lower legs - meloxicam (MOBIC) 15 MG tablet; Take 1 tablet (15 mg total) by mouth daily. (NEEDS TO BE SEEN BEFORE NEXT REFILL)  Dispense: 30 tablet; Refill: 0  2. GAD (generalized anxiety disorder) - ToxASSURE Select 13 (MW), Urine - alprazolam (XANAX) 2 MG tablet; Take 0.5 tablets (1 mg total) by mouth 2 (two) times daily as needed for sleep.  Dispense: 30 tablet; Refill: 2  3. Controlled substance agreement signed  - ToxASSURE Select 13 (MW), Urine - alprazolam (XANAX) 2 MG tablet; Take 0.5 tablets (1 mg total) by mouth 2 (two) times daily as needed for sleep.  Dispense: 30 tablet; Refill: 2 - zolpidem (AMBIEN) 10 MG tablet; Take 1 tablet (10 mg total) by mouth at  bedtime as needed. for sleep  Dispense: 30 tablet; Refill: 2  4. Primary insomnia - zolpidem (AMBIEN) 10 MG tablet; Take 1 tablet (10 mg total) by mouth at bedtime as needed. for sleep  Dispense: 30 tablet; Refill: 2  5. Mild intermittent asthma, unspecified whether complicated  6. Gastroesophageal reflux disease without esophagitis  7. Acquired hypothyroidism  8. Benign paroxysmal positional vertigo, unspecified laterality  9. Hyperlipidemia, unspecified hyperlipidemia type  10. Insomnia due to other mental disorder  11. Moderate episode of recurrent major depressive disorder ([facility])  12. Vitamin B 12 deficiency  - cyanocobalamin (VITAMIN B12) 1000 MCG/ML injection; Inject 1 mL (1,000 mcg total) into the muscle every 30 (thirty) days.  Dispense: 1 mL; Refill: 5   Patient reviewed in [place] controlled database, no flags noted. Contract and drug screen are up to date.   Health Maintenance reviewed Diet and exercise encouraged  Follow up plan: 3 months   Festus Aloe, FNP

## 2021-09-07 NOTE — Patient Instructions (Signed)

## 2021-09-14 LAB — TOXASSURE SELECT 13 (MW), URINE

## 2021-10-06 ENCOUNTER — Other Ambulatory Visit: Payer: Self-pay | Admitting: Family

## 2021-10-31 ENCOUNTER — Other Ambulatory Visit: Payer: Self-pay | Admitting: Family

## 2021-10-31 DIAGNOSIS — F411 Generalized anxiety disorder: Secondary | ICD-10-CM

## 2021-10-31 DIAGNOSIS — F331 Major depressive disorder, recurrent, moderate: Secondary | ICD-10-CM

## 2021-12-05 ENCOUNTER — Other Ambulatory Visit: Payer: Self-pay | Admitting: Family

## 2021-12-05 DIAGNOSIS — F5101 Primary insomnia: Secondary | ICD-10-CM

## 2021-12-05 DIAGNOSIS — K219 Gastro-esophageal reflux disease without esophagitis: Secondary | ICD-10-CM

## 2021-12-05 DIAGNOSIS — Z79899 Other long term (current) drug therapy: Secondary | ICD-10-CM

## 2021-12-08 ENCOUNTER — Ambulatory Visit: Payer: 59 | Admitting: Family

## 2021-12-08 ENCOUNTER — Encounter: Payer: Self-pay | Admitting: Family

## 2021-12-08 VITALS — BP 115/73 | HR 79 | Temp 98.1°F | Ht 64.0 in | Wt 141.0 lb

## 2021-12-08 DIAGNOSIS — F99 Mental disorder, not otherwise specified: Secondary | ICD-10-CM

## 2021-12-08 DIAGNOSIS — K219 Gastro-esophageal reflux disease without esophagitis: Secondary | ICD-10-CM

## 2021-12-08 DIAGNOSIS — Z23 Encounter for immunization: Secondary | ICD-10-CM | POA: Diagnosis not present

## 2021-12-08 DIAGNOSIS — J4520 Mild intermittent asthma, uncomplicated: Secondary | ICD-10-CM

## 2021-12-08 DIAGNOSIS — E785 Hyperlipidemia, unspecified: Secondary | ICD-10-CM

## 2021-12-08 DIAGNOSIS — F331 Major depressive disorder, recurrent, moderate: Secondary | ICD-10-CM | POA: Diagnosis not present

## 2021-12-08 DIAGNOSIS — E039 Hypothyroidism, unspecified: Secondary | ICD-10-CM | POA: Diagnosis not present

## 2021-12-08 DIAGNOSIS — R062 Wheezing: Secondary | ICD-10-CM

## 2021-12-08 DIAGNOSIS — F411 Generalized anxiety disorder: Secondary | ICD-10-CM

## 2021-12-08 DIAGNOSIS — Z1211 Encounter for screening for malignant neoplasm of colon: Secondary | ICD-10-CM

## 2021-12-08 DIAGNOSIS — Z79899 Other long term (current) drug therapy: Secondary | ICD-10-CM | POA: Diagnosis not present

## 2021-12-08 DIAGNOSIS — F5105 Insomnia due to other mental disorder: Secondary | ICD-10-CM

## 2021-12-08 DIAGNOSIS — F4322 Adjustment disorder with anxiety: Secondary | ICD-10-CM

## 2021-12-08 DIAGNOSIS — F5101 Primary insomnia: Secondary | ICD-10-CM

## 2021-12-08 LAB — CMP14+EGFR
ALT: 9 IU/L (ref 0–32)
AST: 17 IU/L (ref 0–40)
Albumin/Globulin Ratio: 2 (ref 1.2–2.2)
Albumin: 4.5 g/dL (ref 3.8–4.9)
Alkaline Phosphatase: 83 IU/L (ref 44–121)
BUN/Creatinine Ratio: 20 (ref 9–23)
BUN: 17 mg/dL (ref 6–24)
Bilirubin Total: 0.2 mg/dL (ref 0.0–1.2)
CO2: 22 mmol/L (ref 20–29)
Calcium: 9.8 mg/dL (ref 8.7–10.2)
Chloride: 104 mmol/L (ref 96–106)
Creatinine, Ser: 0.84 mg/dL (ref 0.57–1.00)
Globulin, Total: 2.2 g/dL (ref 1.5–4.5)
Glucose: 82 mg/dL (ref 70–99)
Potassium: 4 mmol/L (ref 3.5–5.2)
Sodium: 141 mmol/L (ref 134–144)
Total Protein: 6.7 g/dL (ref 6.0–8.5)
eGFR: 80 mL/min/{1.73_m2} (ref >59)

## 2021-12-08 LAB — CBC WITH DIFFERENTIAL/PLATELET
Basophils Absolute: 0 10*3/uL (ref 0.0–0.2)
Basos: 0 %
EOS (ABSOLUTE): 0 10*3/uL (ref 0.0–0.4)
Eos: 0 %
Hematocrit: 33.6 % — ABNORMAL LOW (ref 34.0–46.6)
Hemoglobin: 11.5 g/dL (ref 11.1–15.9)
Immature Grans (Abs): 0 10*3/uL (ref 0.0–0.1)
Immature Granulocytes: 0 %
Lymphocytes Absolute: 1.3 10*3/uL (ref 0.7–3.1)
Lymphs: 41 %
MCH: 30.1 pg (ref 26.6–33.0)
MCHC: 34.2 g/dL (ref 31.5–35.7)
MCV: 88 fL (ref 79–97)
Monocytes Absolute: 0.2 10*3/uL (ref 0.1–0.9)
Monocytes: 5 %
Neutrophils Absolute: 1.8 10*3/uL (ref 1.4–7.0)
Neutrophils: 54 %
Platelets: 238 10*3/uL (ref 150–450)
RBC: 3.82 x10E6/uL (ref 3.77–5.28)
RDW: 11.3 % — ABNORMAL LOW (ref 11.7–15.4)
WBC: 3.3 10*3/uL — ABNORMAL LOW (ref 3.4–10.8)

## 2021-12-08 LAB — TSH: TSH: 0.182 u[IU]/mL — ABNORMAL LOW (ref 0.450–4.500)

## 2021-12-08 MED ORDER — DESVENLAFAXINE SUCCINATE ER 100 MG PO TB24: 100.0000 mg | ORAL_TABLET | Freq: Every day | ORAL | 1 refills | Status: DC

## 2021-12-08 MED ORDER — ALPRAZOLAM 2 MG PO TABS: 1.0000 mg | ORAL_TABLET | Freq: Two times a day (BID) | ORAL | 2 refills | Status: DC | PRN

## 2021-12-08 MED ORDER — BUSPIRONE HCL 15 MG PO TABS: 15.0000 mg | ORAL_TABLET | Freq: Three times a day (TID) | ORAL | 2 refills | Status: DC

## 2021-12-08 MED ORDER — MONTELUKAST SODIUM 10 MG PO TABS: 10.0000 mg | ORAL_TABLET | Freq: Every day | ORAL | 2 refills | Status: DC

## 2021-12-08 MED ORDER — ZOLPIDEM TARTRATE 10 MG PO TABS: 10.0000 mg | ORAL_TABLET | Freq: Every evening | ORAL | 2 refills | Status: DC | PRN

## 2021-12-08 MED ORDER — ESOMEPRAZOLE MAGNESIUM 40 MG PO CPDR: DELAYED_RELEASE_CAPSULE | ORAL | 1 refills | Status: DC

## 2021-12-08 NOTE — Patient Instructions (Signed)

## 2021-12-08 NOTE — Progress Notes (Signed)
Subjective:    Patient ID: Phyllis Parker, female    DOB: 01-01-1961, 61 y.o.   MRN: [id number]  Chief Complaint  Patient presents with   Medical Management of Chronic Issues   PT presents to the office today for chronic follow up. She reports her GAD and depression is slightly improved and quit her job. She got a dollar tree and reports a lot less stress.  Asthma She complains of cough and wheezing. There is no hoarse voice. This is a chronic problem. The current episode started more than 1 year ago. The problem occurs intermittently. The problem has been waxing and waning. Associated symptoms include heartburn. Her symptoms are alleviated by rest. She reports moderate improvement on treatment. Her symptoms are not alleviated by rest. Her past medical history is significant for asthma.  Gastroesophageal Reflux She complains of belching, coughing, heartburn and wheezing. She reports no hoarse voice. This is a chronic problem. The current episode started more than 1 year ago. The problem occurs occasionally. Associated symptoms include fatigue. She has tried a PPI for the symptoms. The treatment provided moderate relief.  Thyroid Problem Presents for follow-up visit. Symptoms include anxiety, fatigue and palpitations. Patient reports no constipation, diarrhea, dry skin or hoarse voice. The symptoms have been stable. Her past medical history is significant for hyperlipidemia.  Insomnia Primary symptoms: difficulty falling asleep, frequent awakening.   The current episode started more than one year. The problem occurs intermittently. Past treatments include medication. The treatment provided moderate relief. PMH includes: depression.   Hyperlipidemia This is a chronic problem. The current episode started more than 1 year ago. The problem is controlled. Current antihyperlipidemic treatment includes statins. The current treatment provides moderate improvement of lipids. Risk factors for coronary artery  disease include dyslipidemia, hypertension, a sedentary lifestyle and post-menopausal.  Anxiety Presents for follow-up visit. Symptoms include excessive worry, insomnia, irritability, nervous/anxious behavior, palpitations, panic and restlessness. Symptoms occur most days. The severity of symptoms is moderate.   Her past medical history is significant for asthma.  Depression        This is a chronic problem.  The current episode started more than 1 year ago.   The onset quality is gradual.   The problem occurs intermittently.  Associated symptoms include fatigue, insomnia, restlessness and decreased interest.  Associated symptoms include no helplessness, no hopelessness and not sad.  Past treatments include SNRIs - Serotonin and norepinephrine reuptake inhibitors.  Past medical history includes thyroid problem and anxiety.       Review of Systems  Constitutional:  Positive for fatigue and irritability.  HENT:  Negative for hoarse voice.   Respiratory:  Positive for cough and wheezing.   Cardiovascular:  Positive for palpitations.  Gastrointestinal:  Positive for heartburn. Negative for constipation and diarrhea.  Psychiatric/Behavioral:  Positive for depression. The patient is nervous/anxious and has insomnia.   All other systems reviewed and are negative.      Objective:   Physical Exam Vitals reviewed.  Constitutional:      General: She is not in acute distress.    Appearance: She is well-developed.  HENT:     Head: Normocephalic and atraumatic.     Right Ear: Tympanic membrane normal.     Left Ear: Tympanic membrane normal.  Eyes:     Pupils: Pupils are equal, round, and reactive to light.  Neck:     Thyroid: No thyromegaly.  Cardiovascular:     Rate and Rhythm: Normal rate and regular rhythm.  Heart sounds: Normal heart sounds. No murmur heard. Pulmonary:     Effort: Pulmonary effort is normal. No respiratory distress.     Breath sounds: Normal breath sounds. No  wheezing.  Abdominal:     General: Bowel sounds are normal. There is no distension.     Palpations: Abdomen is soft.     Tenderness: There is no abdominal tenderness.  Musculoskeletal:        General: No tenderness. Normal range of motion.     Cervical back: Normal range of motion and neck supple.  Skin:    General: Skin is warm and dry.  Neurological:     Mental Status: She is alert and oriented to person, place, and time.     Cranial Nerves: No cranial nerve deficit.     Deep Tendon Reflexes: Reflexes are normal and symmetric.  Psychiatric:        Behavior: Behavior normal.        Thought Content: Thought content normal.        Judgment: Judgment normal.       BP 96/63   Pulse 79   Temp 98.1 F (36.7 C) (Temporal)   Ht _0  (1.626 m)   Wt 141 lb (64 kg)   SpO2 100%   BMI 24.20 kg/m      Assessment & Plan:  Phyllis Parker comes in today with chief complaint of Medical Management of Chronic Issues   Diagnosis and orders addressed:  1. GAD (generalized anxiety disorder) - alprazolam (XANAX) 2 MG tablet; Take 0.5 tablets (1 mg total) by mouth 2 (two) times daily as needed for sleep.  Dispense: 30 tablet; Refill: 2 - desvenlafaxine (PRISTIQ) 100 MG 24 hr tablet; Take 1 tablet (100 mg total) by mouth daily.  Dispense: 90 tablet; Refill: 1 - CMP14+EGFR - CBC with Differential/Platelet  2. Controlled substance agreement signed - alprazolam (XANAX) 2 MG tablet; Take 0.5 tablets (1 mg total) by mouth 2 (two) times daily as needed for sleep.  Dispense: 30 tablet; Refill: 2 - zolpidem (AMBIEN) 10 MG tablet; Take 1 tablet (10 mg total) by mouth at bedtime as needed. for sleep  Dispense: 30 tablet; Refill: 2 - CMP14+EGFR - CBC with Differential/Platelet  3. Moderate episode of recurrent major depressive disorder (HCC) - desvenlafaxine (PRISTIQ) 100 MG 24 hr tablet; Take 1 tablet (100 mg total) by mouth daily.  Dispense: 90 tablet; Refill: 1 - CMP14+EGFR - CBC with  Differential/Platelet  4. Gastroesophageal reflux disease without esophagitis - esomeprazole (NEXIUM) 40 MG capsule; TAKE 1 CAPSULE DAILY AT NOON  Dispense: 90 capsule; Refill: 1 - CMP14+EGFR - CBC with Differential/Platelet  5. Wheeze - montelukast (SINGULAIR) 10 MG tablet; Take 1 tablet (10 mg total) by mouth at bedtime.  Dispense: 90 tablet; Refill: 2 - CMP14+EGFR - CBC with Differential/Platelet  6. Primary insomnia - zolpidem (AMBIEN) 10 MG tablet; Take 1 tablet (10 mg total) by mouth at bedtime as needed. for sleep  Dispense: 30 tablet; Refill: 2 - CMP14+EGFR - CBC with Differential/Platelet  7. Need for immunization against influenza - Flu Vaccine QUAD 21moIM (Fluarix, Fluzone & Alfiuria Quad PF) - CMP14+EGFR - CBC with Differential/Platelet  8. Insomnia due to other mental disorder  - CMP14+EGFR - CBC with Differential/Platelet  9. Acquired hypothyroidism - CMP14+EGFR - CBC with Differential/Platelet - TSH  10. Hyperlipidemia, unspecified hyperlipidemia type - CMP14+EGFR - CBC with Differential/Platelet  11. Mild intermittent asthma, unspecified whether complicated  - CXFG18+EXHB- CBC with Differential/Platelet  12. Adjustment  disorder with anxious mood - CMP14+EGFR - CBC with Differential/Platelet  13. Colon cancer screening  - Cologuard - CMP14+EGFR - CBC with Differential/Platelet   Labs pending Patient reviewed in [place] controlled database, no flags noted. Contract and drug screen are up to date.  Health Maintenance reviewed Diet and exercise encouraged  Follow up plan: 3 months    Evelina Dun, FNP

## 2021-12-11 ENCOUNTER — Other Ambulatory Visit: Payer: Self-pay | Admitting: Family

## 2021-12-11 MED ORDER — LEVOTHYROXINE SODIUM 75 MCG PO TABS: 75.0000 ug | ORAL_TABLET | Freq: Every day | ORAL | 1 refills | Status: DC

## 2022-01-18 ENCOUNTER — Telehealth: Payer: Self-pay

## 2022-01-18 NOTE — Telephone Encounter (Signed)
Phyllis Parker (Key: [id number]) PA Case ID #: [id number] Rx #: [id number] Need Help? Call us at [phone] Status sent iconSent to Plan today Drug Zolpidem Tartrate 10MG  tablets ePA cloud Child psychotherapist Electronic PA Form [phone] NCPDP) Original Claim Info [address] EXCD PA REQ'D CALL [id number] MAXIMUM DAILY DOSE OF .5000(PHARMACY HELP DESK [phone]) For RxLocal Coupon Price of: $17.81 submit to BIN: [id number] PCN: CP Group: [id number] --Service provided at no cost and no switch fee to the pharmacy--

## 2022-01-19 NOTE — Telephone Encounter (Signed)
Patient Advocate Encounter  Prior Authorization for Zolpidem Tartrate 10MG  tablets has been approved.    PA# [id number] Effective dates: 01/18/22 through 01/19/23

## 2022-02-08 ENCOUNTER — Encounter: Payer: Self-pay | Admitting: Family

## 2022-02-08 ENCOUNTER — Telehealth: Payer: Self-pay | Admitting: *Deleted

## 2022-02-08 ENCOUNTER — Ambulatory Visit (INDEPENDENT_AMBULATORY_CARE_PROVIDER_SITE_OTHER): Payer: 59 | Admitting: Family

## 2022-02-08 VITALS — BP 129/81 | HR 76 | Temp 97.4°F | Ht 64.0 in | Wt 142.8 lb

## 2022-02-08 DIAGNOSIS — E039 Hypothyroidism, unspecified: Secondary | ICD-10-CM | POA: Diagnosis not present

## 2022-02-08 DIAGNOSIS — M722 Plantar fascial fibromatosis: Secondary | ICD-10-CM | POA: Diagnosis not present

## 2022-02-08 DIAGNOSIS — M25561 Pain in right knee: Secondary | ICD-10-CM

## 2022-02-08 DIAGNOSIS — F411 Generalized anxiety disorder: Secondary | ICD-10-CM | POA: Diagnosis not present

## 2022-02-08 DIAGNOSIS — R69 Illness, unspecified: Secondary | ICD-10-CM | POA: Diagnosis not present

## 2022-02-08 DIAGNOSIS — M25562 Pain in left knee: Secondary | ICD-10-CM | POA: Diagnosis not present

## 2022-02-08 DIAGNOSIS — J4520 Mild intermittent asthma, uncomplicated: Secondary | ICD-10-CM

## 2022-02-08 DIAGNOSIS — F5101 Primary insomnia: Secondary | ICD-10-CM

## 2022-02-08 MED ORDER — MELOXICAM 15 MG PO TABS: 15.0000 mg | ORAL_TABLET | Freq: Every day | ORAL | 0 refills | Status: DC

## 2022-02-08 NOTE — Patient Instructions (Signed)

## 2022-02-08 NOTE — Progress Notes (Signed)
Subjective:    Patient ID: Phyllis Parker, female    DOB: 1961/01/08, 62 y.o.   MRN: [id number]  Chief Complaint  Patient presents with   thyroid check    2 month follow up    Pt presents to the office today to recheck TSH. She was seen two months ago and we decreased her levothyroxine to 75 mcg from 88 mcg. Pt reports feeling "ok", but feeling tired.   Complaining of bilateral foot pain. Has plantar fasciitis and requesting refill of mobic.   Thyroid Problem Presents for follow-up visit. Symptoms include anxiety and fatigue. Patient reports no depressed mood. The symptoms have been stable.  Asthma There is no cough or wheezing. This is a chronic problem. The current episode started more than 1 year ago. The problem occurs intermittently. She reports moderate improvement on treatment. Her past medical history is significant for asthma.  Anxiety Presents for follow-up visit. Symptoms include excessive worry, insomnia, irritability, nervous/anxious behavior, obsessions and restlessness. Patient reports no depressed mood. Symptoms occur most days. The severity of symptoms is moderate.   Her past medical history is significant for asthma.      Review of Systems  Constitutional:  Positive for fatigue and irritability.  Respiratory:  Negative for cough and wheezing.   Psychiatric/Behavioral:  The patient is nervous/anxious and has insomnia.   All other systems reviewed and are negative.      Objective:   Physical Exam Vitals reviewed.  Constitutional:      General: She is not in acute distress.    Appearance: She is well-developed.  HENT:     Head: Normocephalic and atraumatic.     Right Ear: Tympanic membrane normal.     Left Ear: Tympanic membrane normal.  Eyes:     Pupils: Pupils are equal, round, and reactive to light.  Neck:     Thyroid: No thyromegaly.  Cardiovascular:     Rate and Rhythm: Normal rate and regular rhythm.     Heart sounds: Normal heart sounds. No murmur  heard. Pulmonary:     Effort: Pulmonary effort is normal. No respiratory distress.     Breath sounds: Normal breath sounds. No wheezing.  Abdominal:     General: Bowel sounds are normal. There is no distension.     Palpations: Abdomen is soft.     Tenderness: There is no abdominal tenderness.  Musculoskeletal:        General: No tenderness. Normal range of motion.     Cervical back: Normal range of motion and neck supple.  Skin:    General: Skin is warm and dry.  Neurological:     Mental Status: She is alert and oriented to person, place, and time.     Cranial Nerves: No cranial nerve deficit.     Deep Tendon Reflexes: Reflexes are normal and symmetric.  Psychiatric:        Mood and Affect: Mood is anxious.        Behavior: Behavior normal.        Thought Content: Thought content normal.        Judgment: Judgment normal.       BP 129/81   Pulse 76   Temp (!) 97.4 F (36.3 C) (Temporal)   Ht 5\' 4"  (1.626 m)   Wt 142 lb 12.8 oz (64.8 kg)   SpO2 100%   BMI 24.51 kg/m      Assessment & Plan:  Phyllis Parker comes in today with chief complaint of thyroid  check (2 month follow up )   Diagnosis and orders addressed:  1. Arthralgia of both lower legs - meloxicam (MOBIC) 15 MG tablet; Take 1 tablet (15 mg total) by mouth daily.  Dispense: 30 tablet; Refill: 0  2. Mild intermittent asthma, unspecified whether complicated  3. GAD (generalized anxiety disorder)  4. Acquired hypothyroidism - TSH  5. Primary insomnia  6. Plantar fasciitis Start mobic as needed No other NSAIDs - meloxicam (MOBIC) 15 MG tablet; Take 1 tablet (15 mg total) by mouth daily.  Dispense: 30 tablet; Refill: 0   Labs pending Health Maintenance reviewed Diet and exercise encouraged  Follow up plan: 1 month   Evelina Dun, FNP

## 2022-02-08 NOTE — Telephone Encounter (Signed)
Phyllis Parker (Key: [id number]) PA Case ID #: [id number] Rx #: [id number]   Status: Sent to Plan   Drug: Desvenlafaxine Succinate ER 100MG  er tablets

## 2022-02-09 LAB — TSH: TSH: 5.51 u[IU]/mL — ABNORMAL HIGH (ref 0.450–4.500)

## 2022-02-13 ENCOUNTER — Other Ambulatory Visit: Payer: Self-pay | Admitting: Family

## 2022-02-13 MED ORDER — LEVOTHYROXINE SODIUM 88 MCG PO TABS: 88.0000 ug | ORAL_TABLET | Freq: Every day | ORAL | 1 refills | Status: DC

## 2022-02-15 ENCOUNTER — Encounter: Payer: Self-pay | Admitting: *Deleted

## 2022-02-15 NOTE — Telephone Encounter (Signed)
DENIED  Appeal letter written and sent with clinic notes.

## 2022-02-23 ENCOUNTER — Telehealth: Payer: Self-pay | Admitting: Family

## 2022-02-23 ENCOUNTER — Telehealth (INDEPENDENT_AMBULATORY_CARE_PROVIDER_SITE_OTHER): Payer: 59 | Admitting: Family

## 2022-02-23 ENCOUNTER — Encounter: Payer: Self-pay | Admitting: Family

## 2022-02-23 DIAGNOSIS — U071 COVID-19: Secondary | ICD-10-CM

## 2022-02-23 MED ORDER — MOLNUPIRAVIR EUA 200MG CAPSULE: 4.0000 | ORAL_CAPSULE | Freq: Two times a day (BID) | ORAL | 0 refills | Status: AC

## 2022-02-23 NOTE — Progress Notes (Signed)
Virtual Visit Consent   Phyllis Parker, you are scheduled for a virtual visit with a [facility] provider today. Just as with appointments in the office, your consent must be obtained to participate. Your consent will be active for this visit and any virtual visit you may have with one of our providers in the next 365 days. If you have a MyChart account, a copy of this consent can be sent to you electronically.  As this is a virtual visit, video technology does not allow for your provider to perform a traditional examination. This may limit your provider's ability to fully assess your condition. If your provider identifies any concerns that need to be evaluated in person or the need to arrange testing (such as labs, EKG, etc.), we will make arrangements to do so. Although advances in technology are sophisticated, we cannot ensure that it will always work on either your end or our end. If the connection with a video visit is poor, the visit may have to be switched to a telephone visit. With either a video or telephone visit, we are not always able to ensure that we have a secure connection.  By engaging in this virtual visit, you consent to the provision of healthcare and authorize for your insurance to be billed (if applicable) for the services provided during this visit. Depending on your insurance coverage, you may receive a charge related to this service.  I need to obtain your verbal consent now. Are you willing to proceed with your visit today? Phyllis Parker has provided verbal consent on 02/23/2022 for a virtual visit (video or telephone). Evelina Dun, FNP  Date: 02/23/2022 12:01 PM  Virtual Visit via Video Note   I, Evelina Dun, connected with  Phyllis Parker  ([id number], 03-03-1960) on 02/23/22 at  9:25 AM EST by a video-enabled telemedicine application and verified that I am speaking with the correct person using two identifiers.  Location: Patient: Virtual Visit Location Patient: Home Provider:  Virtual Visit Location Provider: Home Office   I discussed the limitations of evaluation and management by telemedicine and the availability of in person appointments. The patient expressed understanding and agreed to proceed.    History of Present Illness: Phyllis Parker is a 62 y.o. who identifies as a female who was assigned female at birth, and is being seen today for COVID. Reports her symptoms started three days ago.   HPI: URI  This is a new problem. The current episode started in the past 7 days. The problem has been gradually worsening. There has been no fever. Associated symptoms include congestion, coughing, headaches, nausea and sneezing. Pertinent negatives include no ear pain. She has tried acetaminophen for the symptoms. The treatment provided mild relief.    Problems:  Patient Active Problem List   Diagnosis Date Noted   Plantar fasciitis 02/08/2022   Hyperlipidemia 03/10/2021   Asthma 12/08/2020   Controlled substance agreement signed 09/06/2020   GAD (generalized anxiety disorder) 02/04/2019   Primary insomnia 02/04/2019   Elevated lipoprotein(a) 02/04/2019   Insomnia due to other mental disorder 12/18/2017   Osteopenia 09/17/2017   Depression 11/29/2015   Benign paroxysmal positional vertigo 11/29/2015   Adjustment disorder with anxious mood 11/02/2015   Attention deficit hyperactivity disorder (ADHD), predominantly inattentive type 11/02/2015   Gastroesophageal reflux disease without esophagitis 11/02/2015   Hypothyroidism 11/02/2015   Arthralgia of both lower legs 11/02/2015    Allergies:  Allergies  Allergen Reactions   Shingrix [Zoster Vac Recomb Adjuvanted] Other (See Comments)  Arm swollen red hot to touch sore    Sulfa Antibiotics Nausea And Vomiting   Vioxx [Rofecoxib]    Medications:  Current Outpatient Medications:    levothyroxine (SYNTHROID) 88 MCG tablet, Take 1 tablet (88 mcg total) by mouth daily., Disp: 90 tablet, Rfl: 1   molnupiravir EUA  (LAGEVRIO) 200 mg CAPS capsule, Take 4 capsules (800 mg total) by mouth 2 (two) times daily for 5 days., Disp: 40 capsule, Rfl: 0   albuterol (VENTOLIN HFA) 108 (90 Base) MCG/ACT inhaler, Inhale 2 puffs into the lungs every 6 hours as needed for wheezing or shortness of breath., Disp: 18 g, Rfl: 2   alprazolam (XANAX) 2 MG tablet, Take 0.5 tablets (1 mg total) by mouth 2 (two) times daily as needed for sleep., Disp: 30 tablet, Rfl: 2   atorvastatin (LIPITOR) 10 MG tablet, Take 1 tablet (10 mg total) by mouth daily., Disp: 90 tablet, Rfl: 3   budesonide-formoterol (SYMBICORT) 80-4.5 MCG/ACT inhaler, Inhale 2 puffs into the lungs 2 (two) times daily., Disp: 1 each, Rfl: 3   busPIRone (BUSPAR) 15 MG tablet, Take 1 tablet (15 mg total) by mouth 3 (three) times daily., Disp: 90 tablet, Rfl: 2   cetirizine (ZYRTEC) 10 MG tablet, Take 1 tablet (10 mg total) by mouth daily., Disp: 90 tablet, Rfl: 3   cyanocobalamin (VITAMIN B12) 1000 MCG/ML injection, Inject 1 mL (1,000 mcg total) into the muscle every 30 (thirty) days., Disp: 1 mL, Rfl: 5   desvenlafaxine (PRISTIQ) 100 MG 24 hr tablet, Take 1 tablet (100 mg total) by mouth daily., Disp: 90 tablet, Rfl: 1   dicyclomine (BENTYL) 10 MG capsule, TAKE 1 CAPSULE FOUR TIMES DAILY BEFORE MEALS AND AT BEDTIME AS NEEDED, Disp: 90 capsule, Rfl: 0   esomeprazole (NEXIUM) 40 MG capsule, TAKE 1 CAPSULE DAILY AT NOON, Disp: 90 capsule, Rfl: 1   gabapentin (NEURONTIN) 100 MG capsule, Take 1 to 3 capsules (100-300 mg total) by mouth at bedtime., Disp: 90 capsule, Rfl: 3   meloxicam (MOBIC) 15 MG tablet, Take 1 tablet (15 mg total) by mouth daily., Disp: 30 tablet, Rfl: 0   montelukast (SINGULAIR) 10 MG tablet, Take 1 tablet (10 mg total) by mouth at bedtime., Disp: 90 tablet, Rfl: 2   SUMAtriptan (IMITREX) 50 MG tablet, TAKE 1 OR 2 TABLETS AT ONSET OF MIGRAINE. MAY REPEAT IN 2 HOURS IF NEEDED, Disp: 9 tablet, Rfl: 0   zolpidem (AMBIEN) 10 MG tablet, Take 1 tablet (10 mg  total) by mouth at bedtime as needed. for sleep, Disp: 30 tablet, Rfl: 2  Observations/Objective: Patient is well-developed, well-nourished in no acute distress.  Resting comfortably  at home.  Head is normocephalic, atraumatic.  No labored breathing.  Speech is clear and coherent with logical content.  Patient is alert and oriented at baseline.  Nasal congestion   Assessment and Plan: 1. COVID-19 - molnupiravir EUA (LAGEVRIO) 200 mg CAPS capsule; Take 4 capsules (800 mg total) by mouth 2 (two) times daily for 5 days.  Dispense: 40 capsule; Refill: 0  COVID positive, rest, force fluids, tylenol as needed, Quarantine for at least 5 days and you are fever free, then must wear a mask out in public from day [id number], report any worsening symptoms such as increased shortness of breath, swelling, or continued high fevers. Possible adverse effects discussed with antivirals.    Follow Up Instructions: I discussed the assessment and treatment plan with the patient. The patient was provided an opportunity to ask questions and  all were answered. The patient agreed with the plan and demonstrated an understanding of the instructions.  A copy of instructions were sent to the patient via MyChart unless otherwise noted below.    The patient was advised to call back or seek an in-person evaluation if the symptoms worsen or if the condition fails to improve as anticipated.  Time:  I spent 8 minutes with the patient via telehealth technology discussing the above problems/concerns.    Evelina Dun, FNP

## 2022-02-23 NOTE — Telephone Encounter (Signed)
Lmtcb.

## 2022-02-26 NOTE — Telephone Encounter (Signed)
Patient aware and verbalized understanding. Patient states she is feeling much better.

## 2022-02-26 NOTE — Telephone Encounter (Signed)
How is patient feeling? She should take  tylenol, mucniex, rest, and force fluids.

## 2022-03-09 ENCOUNTER — Encounter: Payer: Self-pay | Admitting: Family

## 2022-03-09 ENCOUNTER — Other Ambulatory Visit: Payer: Self-pay | Admitting: Family

## 2022-03-09 ENCOUNTER — Ambulatory Visit (INDEPENDENT_AMBULATORY_CARE_PROVIDER_SITE_OTHER): Payer: 59 | Admitting: Family

## 2022-03-09 ENCOUNTER — Ambulatory Visit: Payer: 59 | Admitting: Family

## 2022-03-09 VITALS — BP 120/80 | HR 75 | Temp 98.0°F | Ht 64.0 in | Wt 142.4 lb

## 2022-03-09 DIAGNOSIS — F411 Generalized anxiety disorder: Secondary | ICD-10-CM

## 2022-03-09 DIAGNOSIS — F5105 Insomnia due to other mental disorder: Secondary | ICD-10-CM | POA: Diagnosis not present

## 2022-03-09 DIAGNOSIS — E7841 Elevated Lipoprotein(a): Secondary | ICD-10-CM

## 2022-03-09 DIAGNOSIS — F99 Mental disorder, not otherwise specified: Secondary | ICD-10-CM | POA: Diagnosis not present

## 2022-03-09 DIAGNOSIS — Z79899 Other long term (current) drug therapy: Secondary | ICD-10-CM

## 2022-03-09 DIAGNOSIS — F5101 Primary insomnia: Secondary | ICD-10-CM

## 2022-03-09 DIAGNOSIS — Z0001 Encounter for general adult medical examination with abnormal findings: Secondary | ICD-10-CM

## 2022-03-09 DIAGNOSIS — E039 Hypothyroidism, unspecified: Secondary | ICD-10-CM

## 2022-03-09 DIAGNOSIS — J4520 Mild intermittent asthma, uncomplicated: Secondary | ICD-10-CM | POA: Diagnosis not present

## 2022-03-09 DIAGNOSIS — E785 Hyperlipidemia, unspecified: Secondary | ICD-10-CM

## 2022-03-09 DIAGNOSIS — Z0000 Encounter for general adult medical examination without abnormal findings: Secondary | ICD-10-CM

## 2022-03-09 DIAGNOSIS — F331 Major depressive disorder, recurrent, moderate: Secondary | ICD-10-CM

## 2022-03-09 DIAGNOSIS — K219 Gastro-esophageal reflux disease without esophagitis: Secondary | ICD-10-CM

## 2022-03-09 MED ORDER — ALPRAZOLAM 2 MG PO TABS: 1.0000 mg | ORAL_TABLET | Freq: Two times a day (BID) | ORAL | 2 refills | Status: DC | PRN

## 2022-03-09 MED ORDER — ATORVASTATIN CALCIUM 10 MG PO TABS: 10.0000 mg | ORAL_TABLET | Freq: Every day | ORAL | 3 refills | Status: DC

## 2022-03-09 MED ORDER — ZOLPIDEM TARTRATE 10 MG PO TABS: 10.0000 mg | ORAL_TABLET | Freq: Every evening | ORAL | 2 refills | Status: DC | PRN

## 2022-03-09 NOTE — Patient Instructions (Signed)

## 2022-03-09 NOTE — Addendum Note (Signed)
Addended by: Evelina Dun A on: 03/09/2022 10:15 AM   Modules accepted: Level of Service

## 2022-03-09 NOTE — Progress Notes (Signed)
Subjective:    Patient ID: Phyllis Parker, female    DOB: 1960-02-18, 62 y.o.   MRN: [id number]  Chief Complaint  Patient presents with   Medical Management of Chronic Issues   PT presents to the office today for CPE and chronic follow up. She reports her GAD and depression is slightly improved and quit her job. She got a dollar tree and reports a lot less stress.   Asthma There is no cough, hoarse voice, shortness of breath or wheezing. This is a chronic problem. The current episode started more than 1 year ago. The problem occurs intermittently. The problem has been waxing and waning. Associated symptoms include heartburn. Her symptoms are alleviated by rest. Her past medical history is significant for asthma.  Gastroesophageal Reflux She complains of belching and heartburn. She reports no coughing, no hoarse voice or no wheezing. This is a chronic problem. The current episode started more than 1 year ago. The problem occurs occasionally. Associated symptoms include fatigue. She has tried a PPI for the symptoms. The treatment provided moderate relief.  Thyroid Problem Presents for follow-up visit. Symptoms include anxiety and fatigue. Patient reports no constipation, dry skin, hoarse voice or leg swelling. The symptoms have been stable. Her past medical history is significant for hyperlipidemia.  Insomnia Primary symptoms: difficulty falling asleep, frequent awakening.   The current episode started more than one year. The onset quality is gradual. The problem occurs intermittently. Past treatments include medication. The treatment provided moderate relief. PMH includes: depression.   Hyperlipidemia This is a chronic problem. The current episode started more than 1 year ago. Pertinent negatives include no shortness of breath. Current antihyperlipidemic treatment includes statins. The current treatment provides moderate improvement of lipids. Risk factors for coronary artery disease include  hypertension, a sedentary lifestyle and dyslipidemia.  Anxiety Presents for follow-up visit. Symptoms include excessive worry, insomnia and nervous/anxious behavior. Patient reports no shortness of breath. Symptoms occur occasionally. The severity of symptoms is moderate.   Her past medical history is significant for asthma.  Depression        This is a chronic problem.  The current episode started more than 1 year ago.   The onset quality is gradual.   The problem occurs intermittently.  Associated symptoms include fatigue, helplessness (some days, but improving), hopelessness, insomnia and sad.  Past treatments include SNRIs - Serotonin and norepinephrine reuptake inhibitors.  Past medical history includes thyroid problem and anxiety.       Review of Systems  Constitutional:  Positive for fatigue.  HENT:  Negative for hoarse voice.   Respiratory:  Negative for cough, shortness of breath and wheezing.   Gastrointestinal:  Positive for heartburn. Negative for constipation.  Psychiatric/Behavioral:  Positive for depression. The patient is nervous/anxious and has insomnia.   All other systems reviewed and are negative.  Family History  Problem Relation Age of Onset   Cancer Mother    Prostate cancer Father    Social History   Socioeconomic History   Marital status: Married    Spouse name: Not on file   Number of children: Not on file   Years of education: Not on file   Highest education level: Not on file  Occupational History   Not on file  Tobacco Use   Smoking status: Never   Smokeless tobacco: Never  Vaping Use   Vaping Use: Never used  Substance and Sexual Activity   Alcohol use: No   Drug use: No   Sexual  activity: Not on file  Other Topics Concern   Not on file  Social History Narrative   ** Merged History Encounter **       Social Determinants of Health   Financial Resource Strain: Not on file  Food Insecurity: Not on file  Transportation Needs: Not on file   Physical Activity: Not on file  Stress: Not on file  Social Connections: Not on file       Objective:   Physical Exam Vitals reviewed.  Constitutional:      General: She is not in acute distress.    Appearance: She is well-developed.  HENT:     Head: Normocephalic and atraumatic.     Right Ear: Tympanic membrane normal.     Left Ear: Tympanic membrane normal.  Eyes:     Pupils: Pupils are equal, round, and reactive to light.  Neck:     Thyroid: No thyromegaly.  Cardiovascular:     Rate and Rhythm: Normal rate and regular rhythm.     Heart sounds: Normal heart sounds. No murmur heard. Pulmonary:     Effort: Pulmonary effort is normal. No respiratory distress.     Breath sounds: Normal breath sounds. No wheezing.  Abdominal:     General: Bowel sounds are normal. There is no distension.     Palpations: Abdomen is soft.     Tenderness: There is no abdominal tenderness.  Musculoskeletal:        General: No tenderness. Normal range of motion.     Cervical back: Normal range of motion and neck supple.  Skin:    General: Skin is warm and dry.  Neurological:     Mental Status: She is alert and oriented to person, place, and time.     Cranial Nerves: No cranial nerve deficit.     Deep Tendon Reflexes: Reflexes are normal and symmetric.  Psychiatric:        Behavior: Behavior normal.        Thought Content: Thought content normal.        Judgment: Judgment normal.       BP 120/80   Pulse 75   Temp 98 F (36.7 C) (Temporal)   Ht '5\' 4"'$  (1.626 m)   Wt 142 lb 6.4 oz (64.6 kg)   SpO2 98%   BMI 24.44 kg/m      Assessment & Plan:  Phyllis Parker comes in today with chief complaint of Medical Management of Chronic Issues   Diagnosis and orders addressed:  1. GAD (generalized anxiety disorder) - alprazolam (XANAX) 2 MG tablet; Take 0.5 tablets (1 mg total) by mouth 2 (two) times daily as needed for sleep.  Dispense: 30 tablet; Refill: 2 - CMP14+EGFR - CBC with  Differential/Platelet  2. Controlled substance agreement signed - alprazolam (XANAX) 2 MG tablet; Take 0.5 tablets (1 mg total) by mouth 2 (two) times daily as needed for sleep.  Dispense: 30 tablet; Refill: 2 - zolpidem (AMBIEN) 10 MG tablet; Take 1 tablet (10 mg total) by mouth at bedtime as needed. for sleep  Dispense: 30 tablet; Refill: 2 - CMP14+EGFR - CBC with Differential/Platelet  3. Elevated lipoprotein(a) - atorvastatin (LIPITOR) 10 MG tablet; Take 1 tablet (10 mg total) by mouth daily.  Dispense: 90 tablet; Refill: 3 - CMP14+EGFR - CBC with Differential/Platelet  4. Primary insomnia - zolpidem (AMBIEN) 10 MG tablet; Take 1 tablet (10 mg total) by mouth at bedtime as needed. for sleep  Dispense: 30 tablet; Refill: 2 - CMP14+EGFR -  CBC with Differential/Platelet  5. Annual physical exam - CMP14+EGFR - CBC with Differential/Platelet - Lipid panel - TSH  6. Gastroesophageal reflux disease without esophagitis - CMP14+EGFR - CBC with Differential/Platelet  7. Acquired hypothyroidism - CMP14+EGFR - CBC with Differential/Platelet  8. Moderate episode of recurrent major depressive disorder (HCC) - CMP14+EGFR - CBC with Differential/Platelet  9. Insomnia due to other mental disorder - CMP14+EGFR - CBC with Differential/Platelet  10. Mild intermittent asthma, unspecified whether complicated - 0000000 - CBC with Differential/Platelet  11. Hyperlipidemia, unspecified hyperlipidemia type - CMP14+EGFR - CBC with Differential/Platelet   Labs pending Patient reviewed in [place] controlled database, no flags noted. Contract and drug screen are up to date.  Health Maintenance reviewed Diet and exercise encouraged  Follow up plan: 3 months    Evelina Dun, FNP

## 2022-03-10 LAB — CMP14+EGFR
ALT: 14 IU/L (ref 0–32)
AST: 22 IU/L (ref 0–40)
Albumin/Globulin Ratio: 2.3 — ABNORMAL HIGH (ref 1.2–2.2)
Albumin: 4.6 g/dL (ref 3.8–4.9)
Alkaline Phosphatase: 92 IU/L (ref 44–121)
BUN/Creatinine Ratio: 14 (ref 9–23)
BUN: 15 mg/dL (ref 6–24)
Bilirubin Total: 0.3 mg/dL (ref 0.0–1.2)
CO2: 24 mmol/L (ref 20–29)
Calcium: 9.2 mg/dL (ref 8.7–10.2)
Chloride: 102 mmol/L (ref 96–106)
Creatinine, Ser: 1.06 mg/dL — ABNORMAL HIGH (ref 0.57–1.00)
Globulin, Total: 2 g/dL (ref 1.5–4.5)
Glucose: 73 mg/dL (ref 70–99)
Potassium: 4.1 mmol/L (ref 3.5–5.2)
Sodium: 143 mmol/L (ref 134–144)
Total Protein: 6.6 g/dL (ref 6.0–8.5)
eGFR: 61 mL/min/{1.73_m2} (ref >59)

## 2022-03-10 LAB — CBC WITH DIFFERENTIAL/PLATELET
Basophils Absolute: 0 10*3/uL (ref 0.0–0.2)
Basos: 1 %
EOS (ABSOLUTE): 0 10*3/uL (ref 0.0–0.4)
Eos: 0 %
Hematocrit: 35.9 % (ref 34.0–46.6)
Hemoglobin: 12.1 g/dL (ref 11.1–15.9)
Immature Grans (Abs): 0 10*3/uL (ref 0.0–0.1)
Immature Granulocytes: 0 %
Lymphocytes Absolute: 1.4 10*3/uL (ref 0.7–3.1)
Lymphs: 34 %
MCH: 30.4 pg (ref 26.6–33.0)
MCHC: 33.7 g/dL (ref 31.5–35.7)
MCV: 90 fL (ref 79–97)
Monocytes Absolute: 0.2 10*3/uL (ref 0.1–0.9)
Monocytes: 5 %
Neutrophils Absolute: 2.5 10*3/uL (ref 1.4–7.0)
Neutrophils: 60 %
Platelets: 264 10*3/uL (ref 150–450)
RBC: 3.98 x10E6/uL (ref 3.77–5.28)
RDW: 12.4 % (ref 11.7–15.4)
WBC: 4.2 10*3/uL (ref 3.4–10.8)

## 2022-03-10 LAB — LIPID PANEL
Chol/HDL Ratio: 2.7 ratio (ref 0.0–4.4)
Cholesterol, Total: 212 mg/dL — ABNORMAL HIGH (ref 100–199)
HDL: 79 mg/dL (ref >39)
LDL Chol Calc (NIH): 116 mg/dL — ABNORMAL HIGH (ref 0–99)
Triglycerides: 99 mg/dL (ref 0–149)
VLDL Cholesterol Cal: 17 mg/dL (ref 5–40)

## 2022-03-10 LAB — TSH: TSH: 2.12 u[IU]/mL (ref 0.450–4.500)

## 2022-06-05 ENCOUNTER — Other Ambulatory Visit: Payer: Self-pay | Admitting: Family

## 2022-06-05 DIAGNOSIS — M25561 Pain in right knee: Secondary | ICD-10-CM

## 2022-06-05 DIAGNOSIS — M722 Plantar fascial fibromatosis: Secondary | ICD-10-CM

## 2022-06-11 ENCOUNTER — Encounter: Payer: Self-pay | Admitting: Family

## 2022-06-11 ENCOUNTER — Ambulatory Visit (INDEPENDENT_AMBULATORY_CARE_PROVIDER_SITE_OTHER): Payer: 59 | Admitting: Family

## 2022-06-11 VITALS — BP 133/85 | HR 87 | Temp 98.1°F | Resp 20 | Ht 64.0 in | Wt 140.0 lb

## 2022-06-11 DIAGNOSIS — F331 Major depressive disorder, recurrent, moderate: Secondary | ICD-10-CM | POA: Diagnosis not present

## 2022-06-11 DIAGNOSIS — F5105 Insomnia due to other mental disorder: Secondary | ICD-10-CM

## 2022-06-11 DIAGNOSIS — J4520 Mild intermittent asthma, uncomplicated: Secondary | ICD-10-CM | POA: Diagnosis not present

## 2022-06-11 DIAGNOSIS — E039 Hypothyroidism, unspecified: Secondary | ICD-10-CM

## 2022-06-11 DIAGNOSIS — F99 Mental disorder, not otherwise specified: Secondary | ICD-10-CM

## 2022-06-11 DIAGNOSIS — F411 Generalized anxiety disorder: Secondary | ICD-10-CM | POA: Diagnosis not present

## 2022-06-11 DIAGNOSIS — E785 Hyperlipidemia, unspecified: Secondary | ICD-10-CM

## 2022-06-11 DIAGNOSIS — K219 Gastro-esophageal reflux disease without esophagitis: Secondary | ICD-10-CM | POA: Diagnosis not present

## 2022-06-11 DIAGNOSIS — Z79899 Other long term (current) drug therapy: Secondary | ICD-10-CM

## 2022-06-11 DIAGNOSIS — F5101 Primary insomnia: Secondary | ICD-10-CM | POA: Diagnosis not present

## 2022-06-11 DIAGNOSIS — G43809 Other migraine, not intractable, without status migrainosus: Secondary | ICD-10-CM | POA: Diagnosis not present

## 2022-06-11 MED ORDER — ALPRAZOLAM 1 MG PO TABS: 1.0000 mg | ORAL_TABLET | Freq: Two times a day (BID) | ORAL | 2 refills | Status: DC | PRN

## 2022-06-11 MED ORDER — ZOLPIDEM TARTRATE 10 MG PO TABS: 10.0000 mg | ORAL_TABLET | Freq: Every evening | ORAL | 2 refills | Status: DC | PRN

## 2022-06-11 MED ORDER — SUMATRIPTAN SUCCINATE 50 MG PO TABS: ORAL_TABLET | ORAL | 0 refills | Status: DC

## 2022-06-11 NOTE — Progress Notes (Signed)
Subjective:    Patient ID: Phyllis Parker, female    DOB: January 12, 1960, 62 y.o.   MRN: [id number]  Chief Complaint  Patient presents with   Medical Management of Chronic Issues   PT presents to the office today for  chronic follow up. She reports her GAD and depression is slightly improved since she quit her job. She got a job at [employer] and reports a lot less stress.    However, having a lot of stress with daughter who has bipolar.  Asthma She complains of cough and shortness of breath. There is no wheezing. This is a chronic problem. The current episode started more than 1 year ago. The problem occurs intermittently. Associated symptoms include heartburn. Her symptoms are alleviated by rest. She reports moderate improvement on treatment. Her past medical history is significant for asthma.  Gastroesophageal Reflux She complains of belching, coughing and heartburn. She reports no wheezing. This is a chronic problem. The current episode started more than 1 year ago. The problem occurs occasionally. Associated symptoms include fatigue. She has tried a PPI for the symptoms. The treatment provided moderate relief.  Thyroid Problem Presents for follow-up visit. Symptoms include anxiety, fatigue and palpitations. Patient reports no constipation or dry skin. The symptoms have been stable. Her past medical history is significant for hyperlipidemia.  Insomnia Primary symptoms: difficulty falling asleep, frequent awakening.   The current episode started more than one year. The onset quality is gradual. The problem occurs intermittently. Past treatments include meditation. The treatment provided moderate relief. PMH includes: depression.   Hyperlipidemia This is a chronic problem. The current episode started more than 1 year ago. The problem is controlled. Recent lipid tests were reviewed and are normal. Associated symptoms include shortness of breath. Current antihyperlipidemic treatment includes statins.  The current treatment provides moderate improvement of lipids. Risk factors for coronary artery disease include dyslipidemia, hypertension and a sedentary lifestyle.  Anxiety Presents for follow-up visit. Symptoms include excessive worry, hyperventilation, insomnia, nervous/anxious behavior, obsessions, palpitations, panic, restlessness and shortness of breath. Symptoms occur most days. The severity of symptoms is moderate.   Her past medical history is significant for asthma.  Depression        This is a chronic problem.  The current episode started more than 1 year ago.   The problem occurs intermittently.  Associated symptoms include fatigue, helplessness, hopelessness, insomnia, restlessness, decreased interest and sad.  Past treatments include SNRIs - Serotonin and norepinephrine reuptake inhibitors.  Past medical history includes thyroid problem and anxiety.       Review of Systems  Constitutional:  Positive for fatigue.  Respiratory:  Positive for cough and shortness of breath. Negative for wheezing.   Cardiovascular:  Positive for palpitations.  Gastrointestinal:  Positive for heartburn. Negative for constipation.  Psychiatric/Behavioral:  Positive for depression. The patient is nervous/anxious and has insomnia.   All other systems reviewed and are negative.      Objective:   Physical Exam Vitals reviewed.  Constitutional:      General: She is not in acute distress.    Appearance: She is well-developed.  HENT:     Head: Normocephalic and atraumatic.     Right Ear: Tympanic membrane normal.     Left Ear: Tympanic membrane normal.  Eyes:     Pupils: Pupils are equal, round, and reactive to light.  Neck:     Thyroid: No thyromegaly.  Cardiovascular:     Rate and Rhythm: Normal rate and regular rhythm.  Heart sounds: Normal heart sounds. No murmur heard. Pulmonary:     Effort: Pulmonary effort is normal. No respiratory distress.     Breath sounds: Normal breath sounds.  No wheezing.  Abdominal:     General: Bowel sounds are normal. There is no distension.     Palpations: Abdomen is soft.     Tenderness: There is no abdominal tenderness.  Musculoskeletal:        General: No tenderness. Normal range of motion.     Cervical back: Normal range of motion and neck supple.  Skin:    General: Skin is warm and dry.  Neurological:     Mental Status: She is alert and oriented to person, place, and time.     Cranial Nerves: No cranial nerve deficit.     Deep Tendon Reflexes: Reflexes are normal and symmetric.  Psychiatric:        Mood and Affect: Mood is anxious. Affect is tearful.        Behavior: Behavior is hyperactive.        Thought Content: Thought content normal.        Judgment: Judgment normal.       BP 133/85   Pulse 87   Temp 98.1 F (36.7 C) (Temporal)   Resp 20   Ht 5\' 4"  (1.626 m)   Wt 140 lb (63.5 kg)   SpO2 97%   BMI 24.03 kg/m      Assessment & Plan:  Phyllis Parker comes in today with chief complaint of Medical Management of Chronic Issues   Diagnosis and orders addressed:  1. Mild intermittent asthma, unspecified whether complicated - CMP14+EGFR  2. Controlled substance agreement signed  - CMP14+EGFR - ALPRAZolam (XANAX) 1 MG tablet; Take 1 tablet (1 mg total) by mouth 2 (two) times daily as needed for anxiety.  Dispense: 60 tablet; Refill: 2 - zolpidem (AMBIEN) 10 MG tablet; Take 1 tablet (10 mg total) by mouth at bedtime as needed. for sleep  Dispense: 30 tablet; Refill: 2  3. Moderate episode of recurrent major depressive disorder (HCC) - Ambulatory referral to Psychiatry - CMP14+EGFR  4. GAD (generalized anxiety disorder) - Ambulatory referral to Psychiatry - CMP14+EGFR - ALPRAZolam (XANAX) 1 MG tablet; Take 1 tablet (1 mg total) by mouth 2 (two) times daily as needed for anxiety.  Dispense: 60 tablet; Refill: 2  5. Gastroesophageal reflux disease without esophagitis  - CMP14+EGFR  6. Hyperlipidemia, unspecified  hyperlipidemia type - CMP14+EGFR  7. Acquired hypothyroidism - CMP14+EGFR - TSH  8. Insomnia due to other mental disorder  - Ambulatory referral to Psychiatry - CMP14+EGFR  9. Other migraine without status migrainosus, not intractable - SUMAtriptan (IMITREX) 50 MG tablet; TAKE 1 OR 2 TABLETS AT ONSET OF MIGRAINE. MAY REPEAT IN 2 HOURS IF NEEDED  Dispense: 9 tablet; Refill: 0  10. Primary insomnia  - zolpidem (AMBIEN) 10 MG tablet; Take 1 tablet (10 mg total) by mouth at bedtime as needed. for sleep  Dispense: 30 tablet; Refill: 2   Labs pending Patient reviewed in [place] controlled database, no flags noted. Contract and drug screen are up to date.  Referral to Psychiatry pending Continue medications Health Maintenance reviewed Diet and exercise encouraged  Follow up plan: 3 months    Jannifer Rodney, FNP

## 2022-06-11 NOTE — Patient Instructions (Signed)

## 2022-06-12 ENCOUNTER — Other Ambulatory Visit: Payer: Self-pay | Admitting: Family

## 2022-06-12 LAB — CMP14+EGFR
ALT: 7 IU/L (ref 0–32)
AST: 18 IU/L (ref 0–40)
Albumin/Globulin Ratio: 2 (ref 1.2–2.2)
Albumin: 4.6 g/dL (ref 3.8–4.9)
Alkaline Phosphatase: 87 IU/L (ref 44–121)
BUN/Creatinine Ratio: 15 (ref 9–23)
BUN: 14 mg/dL (ref 6–24)
Bilirubin Total: 0.3 mg/dL (ref 0.0–1.2)
CO2: 22 mmol/L (ref 20–29)
Calcium: 9.5 mg/dL (ref 8.7–10.2)
Chloride: 100 mmol/L (ref 96–106)
Creatinine, Ser: 0.96 mg/dL (ref 0.57–1.00)
Globulin, Total: 2.3 g/dL (ref 1.5–4.5)
Glucose: 82 mg/dL (ref 70–99)
Potassium: 4.1 mmol/L (ref 3.5–5.2)
Sodium: 140 mmol/L (ref 134–144)
Total Protein: 6.9 g/dL (ref 6.0–8.5)
eGFR: 68 mL/min/{1.73_m2} (ref >59)

## 2022-06-12 LAB — TSH: TSH: 0.364 u[IU]/mL — ABNORMAL LOW (ref 0.450–4.500)

## 2022-06-12 MED ORDER — LEVOTHYROXINE SODIUM 75 MCG PO TABS: 75.0000 ug | ORAL_TABLET | Freq: Every day | ORAL | 1 refills | Status: DC

## 2022-06-23 IMAGING — DX DG RIBS W/ CHEST 3+V*L*
3 series · 3 of 3 positions shown · non-contrast
Comparison: None available

CLINICAL DATA: Pain along the mid axillary fourth and fifth ribs

EXAM:
LEFT RIBS AND CHEST - 3+ VIEW

[chest pa]
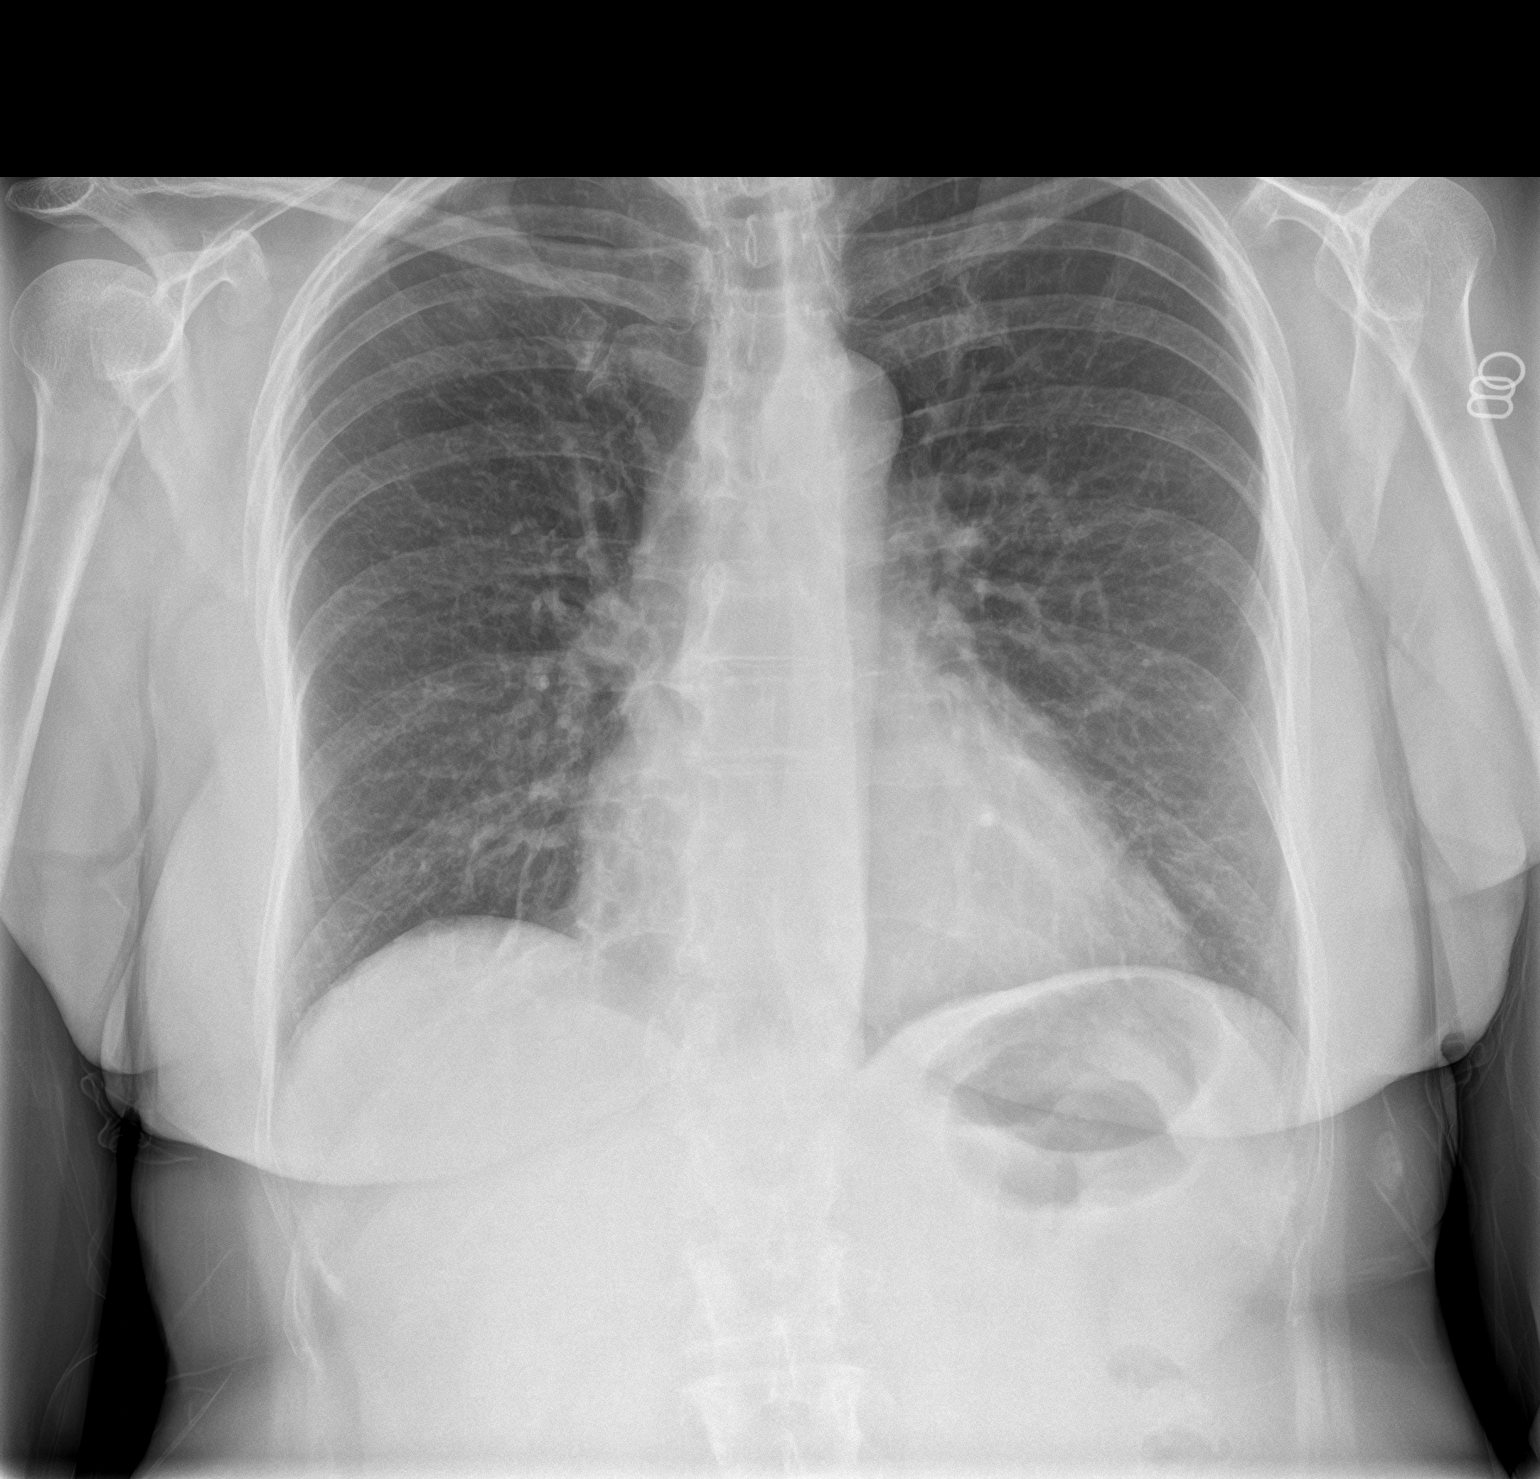

[rib pa obl]
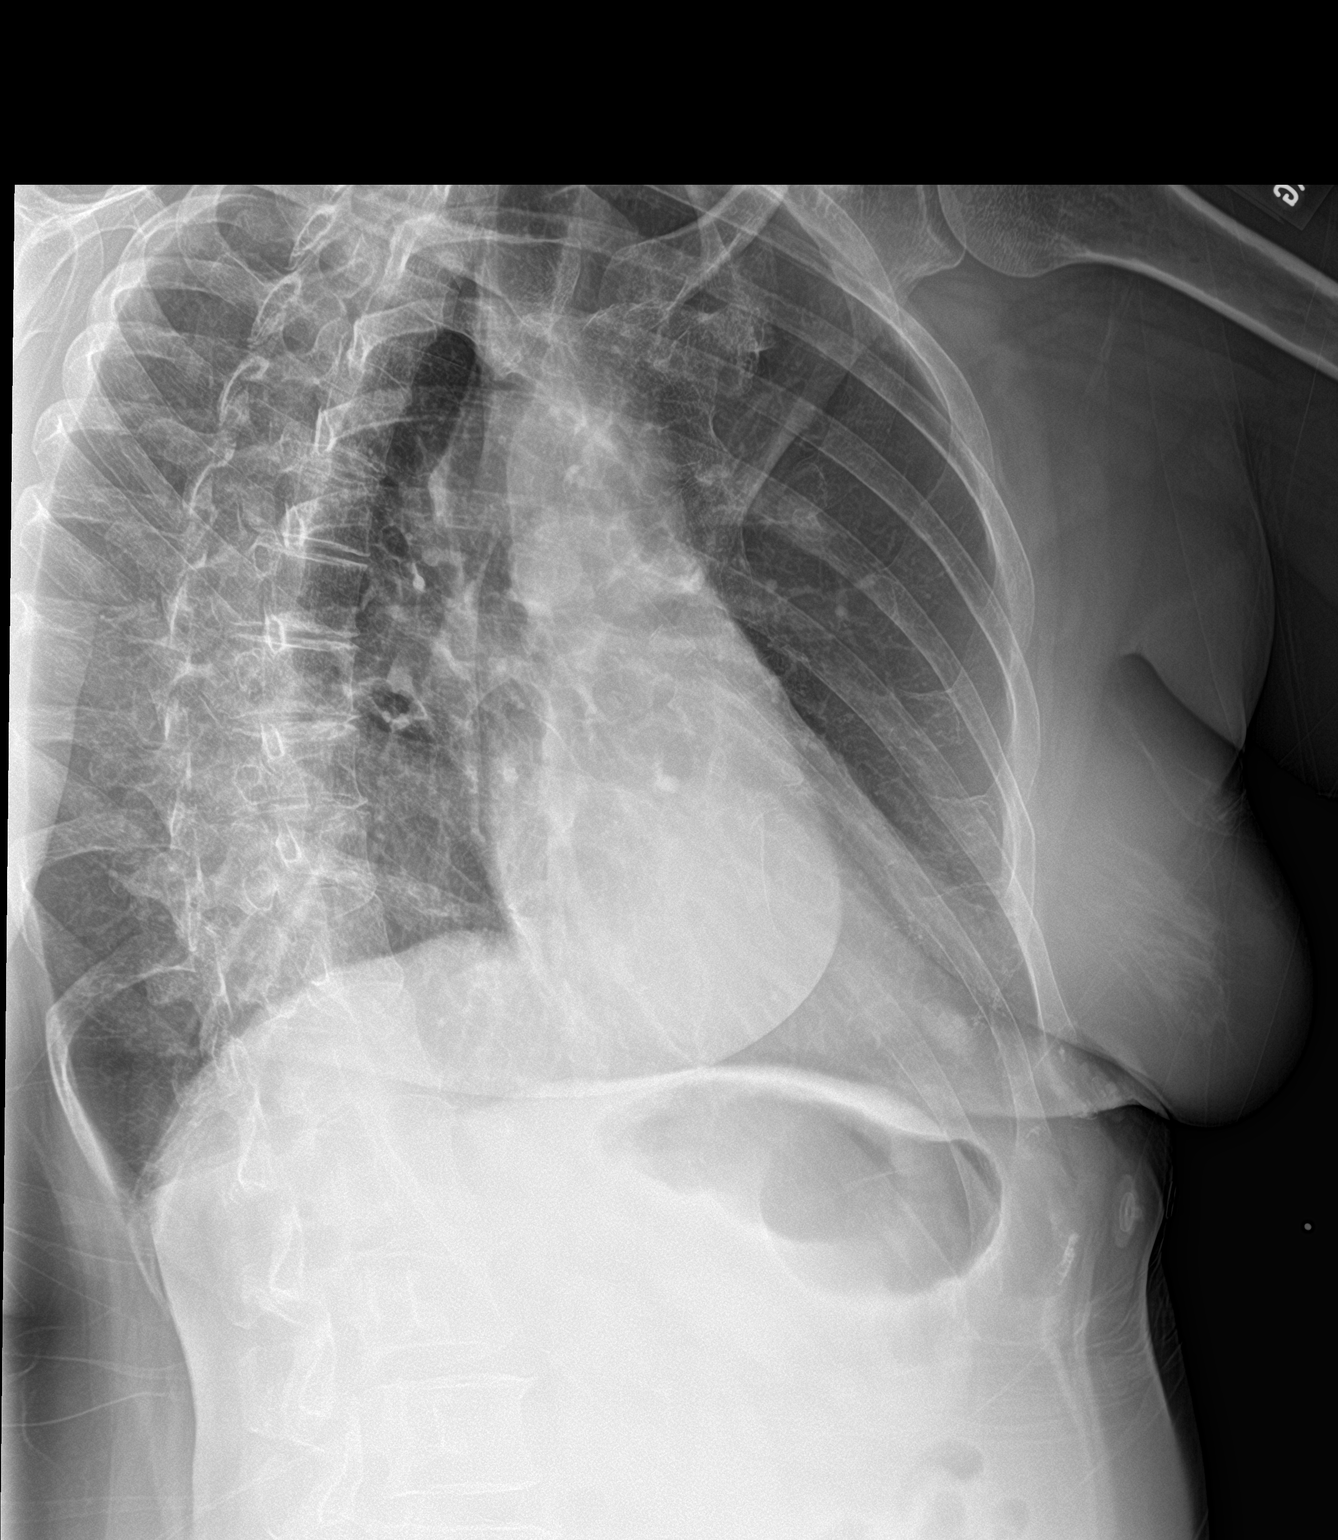

[rib pa]
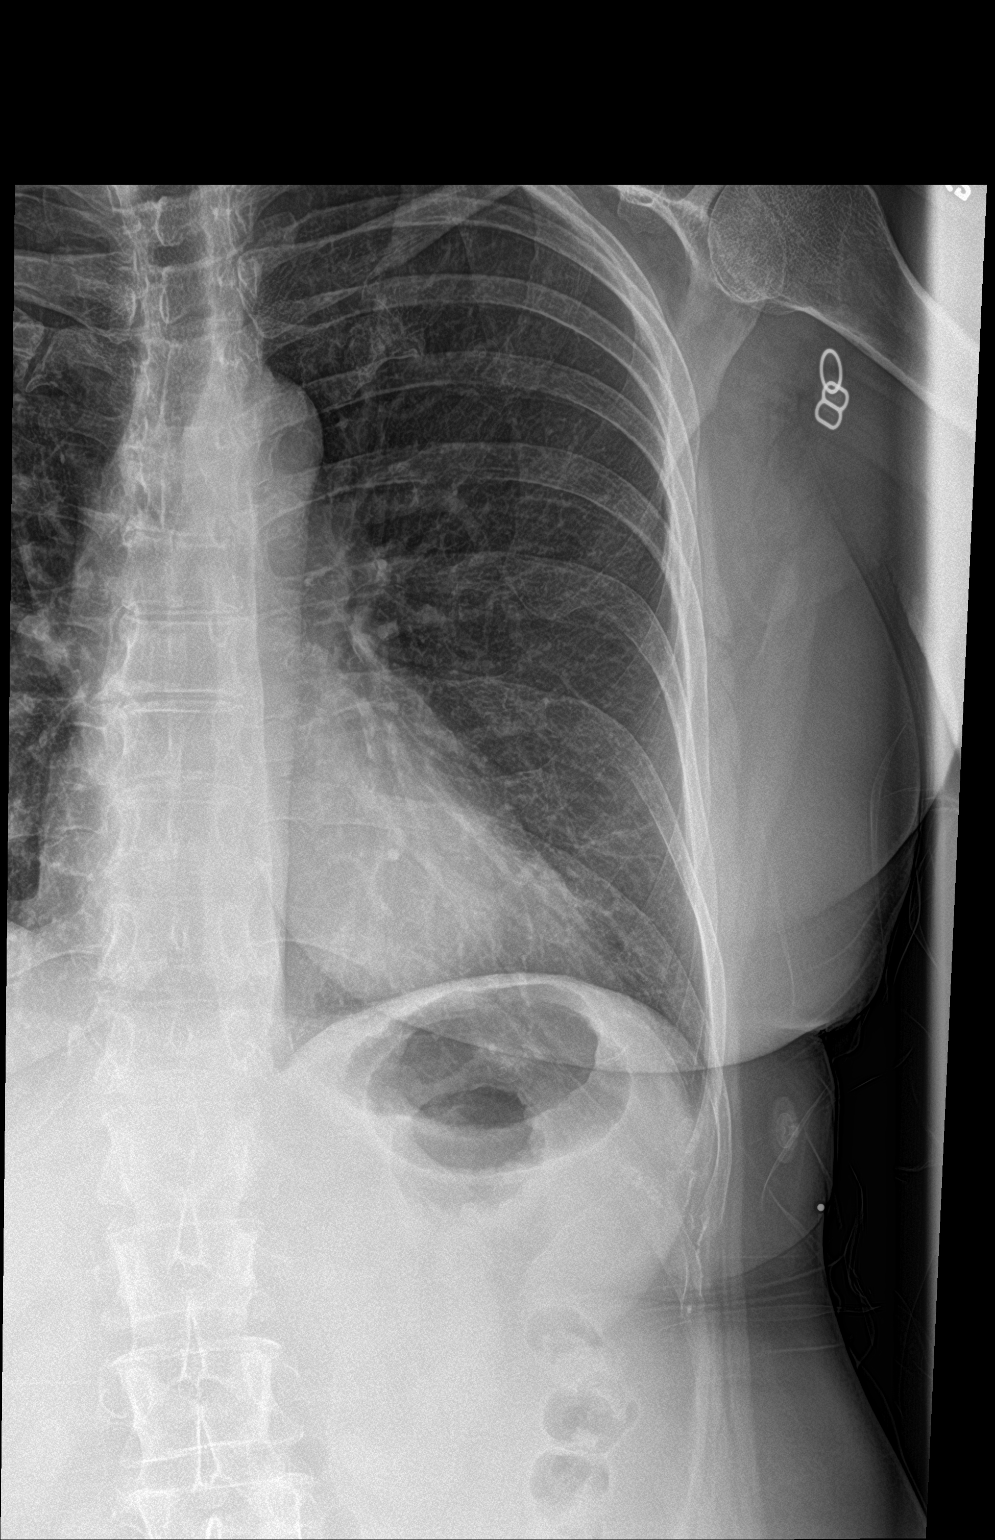

[3 of 3 positions shown; findings below may reference images not displayed]

FINDINGS: No fracture or other bone lesions are seen involving the ribs. There
is no evidence of pneumothorax or pleural effusion. Both lungs are
clear. Heart size and mediastinal contours are within normal limits.
IMPRESSION: Negative.

## 2022-07-04 ENCOUNTER — Other Ambulatory Visit: Payer: Self-pay | Admitting: Family

## 2022-07-04 DIAGNOSIS — M722 Plantar fascial fibromatosis: Secondary | ICD-10-CM

## 2022-07-04 DIAGNOSIS — M25562 Pain in left knee: Secondary | ICD-10-CM

## 2022-08-13 ENCOUNTER — Encounter: Payer: Self-pay | Admitting: Family

## 2022-08-13 ENCOUNTER — Ambulatory Visit (INDEPENDENT_AMBULATORY_CARE_PROVIDER_SITE_OTHER): Payer: 59 | Admitting: Family

## 2022-08-13 VITALS — BP 125/74 | HR 74 | Temp 97.9°F | Ht 64.0 in | Wt 139.0 lb

## 2022-08-13 DIAGNOSIS — F5101 Primary insomnia: Secondary | ICD-10-CM

## 2022-08-13 DIAGNOSIS — E039 Hypothyroidism, unspecified: Secondary | ICD-10-CM | POA: Diagnosis not present

## 2022-08-13 DIAGNOSIS — Z79899 Other long term (current) drug therapy: Secondary | ICD-10-CM | POA: Diagnosis not present

## 2022-08-13 DIAGNOSIS — F411 Generalized anxiety disorder: Secondary | ICD-10-CM | POA: Diagnosis not present

## 2022-08-13 MED ORDER — ALPRAZOLAM 1 MG PO TABS: 1.0000 mg | ORAL_TABLET | Freq: Two times a day (BID) | ORAL | 2 refills | Status: DC | PRN

## 2022-08-13 MED ORDER — ZOLPIDEM TARTRATE 10 MG PO TABS
10.0000 mg | ORAL_TABLET | Freq: Every evening | ORAL | 2 refills | Status: DC | PRN
Start: 2022-08-13 — End: 2022-11-09

## 2022-08-13 NOTE — Patient Instructions (Signed)

## 2022-08-13 NOTE — Progress Notes (Signed)
Subjective:    Patient ID: Phyllis Parker, female    DOB: 12-16-1960, 62 y.o.   MRN: [id number]  Chief Complaint  Patient presents with   Hypothyroidism   PT presents to the office today to follow up on hypothyroid. She was seen on  06/11/22 and we decreased her levothyroxine to 75 mcg from 88 mcg. Reports she is doing well.  Thyroid Problem Presents for follow-up visit. Symptoms include anxiety, depressed mood and fatigue. Patient reports no diaphoresis, diarrhea or dry skin. The symptoms have been stable.  Anxiety Presents for follow-up visit. Symptoms include depressed mood, excessive worry, insomnia, irritability, nervous/anxious behavior and restlessness.    Insomnia Primary symptoms: difficulty falling asleep, frequent awakening.   The current episode started more than one year. The onset quality is gradual. The treatment provided moderate relief.      Review of Systems  Constitutional:  Positive for fatigue and irritability. Negative for diaphoresis.  Gastrointestinal:  Negative for diarrhea.  Psychiatric/Behavioral:  The patient is nervous/anxious and has insomnia.   All other systems reviewed and are negative.      Objective:   Physical Exam Vitals reviewed.  Constitutional:      General: She is not in acute distress.    Appearance: She is well-developed.  HENT:     Head: Normocephalic and atraumatic.     Right Ear: Tympanic membrane normal.     Left Ear: Tympanic membrane normal.  Eyes:     Pupils: Pupils are equal, round, and reactive to light.  Neck:     Thyroid: No thyromegaly.  Cardiovascular:     Rate and Rhythm: Normal rate and regular rhythm.     Heart sounds: Normal heart sounds. No murmur heard. Pulmonary:     Effort: Pulmonary effort is normal. No respiratory distress.     Breath sounds: Normal breath sounds. No wheezing.  Abdominal:     General: Bowel sounds are normal. There is no distension.     Palpations: Abdomen is soft.     Tenderness:  There is no abdominal tenderness.  Musculoskeletal:        General: No tenderness. Normal range of motion.     Cervical back: Normal range of motion and neck supple.  Skin:    General: Skin is warm and dry.  Neurological:     Mental Status: She is alert and oriented to person, place, and time.     Cranial Nerves: No cranial nerve deficit.     Deep Tendon Reflexes: Reflexes are normal and symmetric.  Psychiatric:        Behavior: Behavior normal.        Thought Content: Thought content normal.        Judgment: Judgment normal.       BP 125/74   Pulse 74   Temp 97.9 F (36.6 C) (Temporal)   Ht 5\' 4"  (1.626 m)   Wt 139 lb (63 kg)   SpO2 99%   BMI 23.86 kg/m      Assessment & Plan:  Phyllis Parker comes in today with chief complaint of Hypothyroidism   Diagnosis and orders addressed:  1. Acquired hypothyroidism  2. Controlled substance agreement signed - ALPRAZolam (XANAX) 1 MG tablet; Take 1 tablet (1 mg total) by mouth 2 (two) times daily as needed for anxiety.  Dispense: 60 tablet; Refill: 2 - zolpidem (AMBIEN) 10 MG tablet; Take 1 tablet (10 mg total) by mouth at bedtime as needed. for sleep  Dispense: 30 tablet; Refill:  2  3. GAD (generalized anxiety disorder) - ALPRAZolam (XANAX) 1 MG tablet; Take 1 tablet (1 mg total) by mouth 2 (two) times daily as needed for anxiety.  Dispense: 60 tablet; Refill: 2  4. Primary insomnia - zolpidem (AMBIEN) 10 MG tablet; Take 1 tablet (10 mg total) by mouth at bedtime as needed. for sleep  Dispense: 30 tablet; Refill: 2   Labs pending Patient reviewed in [place] controlled database, no flags noted. Contract and drug screen are up to date.  Health Maintenance reviewed Diet and exercise encouraged  Follow up plan: 3 months    Jannifer Rodney, FNP

## 2022-08-18 ENCOUNTER — Other Ambulatory Visit: Payer: Self-pay | Admitting: Family

## 2022-08-18 DIAGNOSIS — F331 Major depressive disorder, recurrent, moderate: Secondary | ICD-10-CM

## 2022-08-18 DIAGNOSIS — F411 Generalized anxiety disorder: Secondary | ICD-10-CM

## 2022-09-06 ENCOUNTER — Other Ambulatory Visit: Payer: Self-pay | Admitting: Family

## 2022-09-06 DIAGNOSIS — M722 Plantar fascial fibromatosis: Secondary | ICD-10-CM

## 2022-09-06 DIAGNOSIS — M25562 Pain in left knee: Secondary | ICD-10-CM

## 2022-09-11 ENCOUNTER — Ambulatory Visit: Payer: 59 | Admitting: Family

## 2022-09-14 ENCOUNTER — Ambulatory Visit: Payer: 59 | Admitting: Family

## 2022-11-08 ENCOUNTER — Other Ambulatory Visit: Payer: Self-pay | Admitting: Family

## 2022-11-08 DIAGNOSIS — M25561 Pain in right knee: Secondary | ICD-10-CM

## 2022-11-08 DIAGNOSIS — M722 Plantar fascial fibromatosis: Secondary | ICD-10-CM

## 2022-11-09 ENCOUNTER — Ambulatory Visit (INDEPENDENT_AMBULATORY_CARE_PROVIDER_SITE_OTHER): Payer: 59 | Admitting: Family

## 2022-11-09 ENCOUNTER — Encounter: Payer: Self-pay | Admitting: Family

## 2022-11-09 VITALS — BP 131/83 | HR 74 | Temp 97.7°F | Ht 64.0 in | Wt 145.6 lb

## 2022-11-09 DIAGNOSIS — F5101 Primary insomnia: Secondary | ICD-10-CM

## 2022-11-09 DIAGNOSIS — F331 Major depressive disorder, recurrent, moderate: Secondary | ICD-10-CM | POA: Diagnosis not present

## 2022-11-09 DIAGNOSIS — Z79899 Other long term (current) drug therapy: Secondary | ICD-10-CM | POA: Diagnosis not present

## 2022-11-09 DIAGNOSIS — E785 Hyperlipidemia, unspecified: Secondary | ICD-10-CM | POA: Diagnosis not present

## 2022-11-09 DIAGNOSIS — E039 Hypothyroidism, unspecified: Secondary | ICD-10-CM | POA: Diagnosis not present

## 2022-11-09 DIAGNOSIS — F99 Mental disorder, not otherwise specified: Secondary | ICD-10-CM | POA: Diagnosis not present

## 2022-11-09 DIAGNOSIS — M25561 Pain in right knee: Secondary | ICD-10-CM | POA: Diagnosis not present

## 2022-11-09 DIAGNOSIS — F5105 Insomnia due to other mental disorder: Secondary | ICD-10-CM

## 2022-11-09 DIAGNOSIS — Z23 Encounter for immunization: Secondary | ICD-10-CM

## 2022-11-09 DIAGNOSIS — J4520 Mild intermittent asthma, uncomplicated: Secondary | ICD-10-CM

## 2022-11-09 DIAGNOSIS — M722 Plantar fascial fibromatosis: Secondary | ICD-10-CM

## 2022-11-09 DIAGNOSIS — F411 Generalized anxiety disorder: Secondary | ICD-10-CM

## 2022-11-09 DIAGNOSIS — M25562 Pain in left knee: Secondary | ICD-10-CM

## 2022-11-09 DIAGNOSIS — J209 Acute bronchitis, unspecified: Secondary | ICD-10-CM | POA: Diagnosis not present

## 2022-11-09 DIAGNOSIS — K219 Gastro-esophageal reflux disease without esophagitis: Secondary | ICD-10-CM

## 2022-11-09 DIAGNOSIS — R062 Wheezing: Secondary | ICD-10-CM

## 2022-11-09 MED ORDER — MELOXICAM 15 MG PO TABS
15.0000 mg | ORAL_TABLET | Freq: Every day | ORAL | 0 refills | Status: DC
Start: 2022-11-09 — End: 2023-01-07

## 2022-11-09 MED ORDER — ZOLPIDEM TARTRATE 10 MG PO TABS: 10.0000 mg | ORAL_TABLET | Freq: Every evening | ORAL | 2 refills | Status: DC | PRN

## 2022-11-09 MED ORDER — ALBUTEROL SULFATE HFA 108 (90 BASE) MCG/ACT IN AERS
INHALATION_SPRAY | RESPIRATORY_TRACT | 2 refills | Status: DC
Start: 2022-11-09 — End: 2023-10-30

## 2022-11-09 MED ORDER — MONTELUKAST SODIUM 10 MG PO TABS
10.0000 mg | ORAL_TABLET | Freq: Every day | ORAL | 2 refills | Status: DC
Start: 2022-11-09 — End: 2023-04-11

## 2022-11-09 MED ORDER — ESOMEPRAZOLE MAGNESIUM 40 MG PO CPDR: DELAYED_RELEASE_CAPSULE | ORAL | 1 refills | Status: DC

## 2022-11-09 MED ORDER — ALPRAZOLAM 1 MG PO TABS
1.0000 mg | ORAL_TABLET | Freq: Two times a day (BID) | ORAL | 2 refills | Status: DC | PRN
Start: 2022-11-09 — End: 2023-02-11

## 2022-11-09 NOTE — Patient Instructions (Signed)

## 2022-11-09 NOTE — Progress Notes (Addendum)
Subjective:    Patient ID: Phyllis Parker, female    DOB: Jun 10, 1960, 61 y.o.   MRN: [id number]  Chief Complaint  Patient presents with   Medical Management of Chronic Issues   PT presents to the office today for  chronic follow up. She reports her GAD and depression is slightly improved since she quit her job. She got a job at [employer] and reports a lot less stress.     However, having a lot of stress with daughter who has bipolar.  Asthma There is no cough, shortness of breath or wheezing. This is a chronic problem. The current episode started more than 1 year ago. The problem occurs intermittently. Associated symptoms include heartburn. She reports moderate improvement on treatment. Her past medical history is significant for asthma.  Gastroesophageal Reflux She complains of belching and heartburn. She reports no coughing or no wheezing. This is a chronic problem. The current episode started more than 1 year ago. The problem occurs occasionally. Associated symptoms include fatigue. She has tried a PPI for the symptoms. The treatment provided moderate relief.  Thyroid Problem Presents for follow-up visit. Symptoms include anxiety and fatigue. Patient reports no constipation or diarrhea. The symptoms have been stable. Her past medical history is significant for hyperlipidemia.  Insomnia Primary symptoms: difficulty falling asleep, frequent awakening.   The current episode started more than one year. The problem occurs intermittently. Past treatments include medication. The treatment provided moderate relief. PMH includes: depression.   Hyperlipidemia This is a chronic problem. The current episode started more than 1 year ago. Pertinent negatives include no shortness of breath. Current antihyperlipidemic treatment includes statins. The current treatment provides moderate improvement of lipids. Risk factors for coronary artery disease include dyslipidemia, hypertension, a sedentary lifestyle and  post-menopausal.  Anxiety Presents for follow-up visit. Symptoms include excessive worry, insomnia, nervous/anxious behavior and restlessness. Patient reports no shortness of breath. Symptoms occur most days. The severity of symptoms is mild.   Her past medical history is significant for asthma.  Depression        This is a chronic problem.  The current episode started more than 1 year ago.   The problem occurs intermittently.  Associated symptoms include fatigue, helplessness, hopelessness, insomnia, restlessness and sad.  Past treatments include SNRIs - Serotonin and norepinephrine reuptake inhibitors.  Past medical history includes thyroid problem and anxiety.       Review of Systems  Constitutional:  Positive for fatigue.  Respiratory:  Negative for cough, shortness of breath and wheezing.   Gastrointestinal:  Positive for heartburn. Negative for constipation and diarrhea.  Psychiatric/Behavioral:  Positive for depression. The patient is nervous/anxious and has insomnia.   All other systems reviewed and are negative.      Objective:   Physical Exam Vitals reviewed.  Constitutional:      General: She is not in acute distress.    Appearance: She is well-developed.  HENT:     Head: Normocephalic and atraumatic.     Right Ear: Tympanic membrane normal.     Left Ear: Tympanic membrane normal.  Eyes:     Pupils: Pupils are equal, round, and reactive to light.  Neck:     Thyroid: No thyromegaly.  Cardiovascular:     Rate and Rhythm: Normal rate and regular rhythm.     Heart sounds: Normal heart sounds. No murmur heard. Pulmonary:     Effort: Pulmonary effort is normal. No respiratory distress.     Breath sounds: Normal breath  sounds. No wheezing.  Abdominal:     General: Bowel sounds are normal. There is no distension.     Palpations: Abdomen is soft.     Tenderness: There is no abdominal tenderness.  Musculoskeletal:        General: No tenderness. Normal range of motion.      Cervical back: Normal range of motion and neck supple.  Skin:    General: Skin is warm and dry.  Neurological:     Mental Status: She is alert and oriented to person, place, and time.     Cranial Nerves: No cranial nerve deficit.     Deep Tendon Reflexes: Reflexes are normal and symmetric.  Psychiatric:        Mood and Affect: Affect is tearful.        Behavior: Behavior normal.        Thought Content: Thought content normal.        Judgment: Judgment normal.       BP 131/83   Pulse 74   Temp 97.7 F (36.5 C) (Temporal)   Ht 5\' 4"  (1.626 m)   Wt 145 lb 9.6 oz (66 kg)   SpO2 96%   BMI 24.99 kg/m      Assessment & Plan:  Phyllis Parker comes in today with chief complaint of Medical Management of Chronic Issues   Diagnosis and orders addressed:  1. Acute bronchitis, unspecified organism - albuterol (VENTOLIN HFA) 108 (90 Base) MCG/ACT inhaler; Inhale 2 puffs into the lungs every 6 hours as needed for wheezing or shortness of breath.  Dispense: 18 g; Refill: 2  2. Controlled substance agreement signed - ALPRAZolam (XANAX) 1 MG tablet; Take 1 tablet (1 mg total) by mouth 2 (two) times daily as needed for anxiety.  Dispense: 60 tablet; Refill: 2 - zolpidem (AMBIEN) 10 MG tablet; Take 1 tablet (10 mg total) by mouth at bedtime as needed. for sleep  Dispense: 30 tablet; Refill: 2  3. GAD (generalized anxiety disorder) - ALPRAZolam (XANAX) 1 MG tablet; Take 1 tablet (1 mg total) by mouth 2 (two) times daily as needed for anxiety.  Dispense: 60 tablet; Refill: 2  4. Gastroesophageal reflux disease without esophagitis - esomeprazole (NEXIUM) 40 MG capsule; TAKE 1 CAPSULE DAILY AT NOON  Dispense: 90 capsule; Refill: 1  5. Arthralgia of both lower legs - meloxicam (MOBIC) 15 MG tablet; Take 1 tablet (15 mg total) by mouth daily.  Dispense: 30 tablet; Refill: 0  6. Plantar fasciitis - meloxicam (MOBIC) 15 MG tablet; Take 1 tablet (15 mg total) by mouth daily.  Dispense: 30 tablet;  Refill: 0  7. Wheeze - montelukast (SINGULAIR) 10 MG tablet; Take 1 tablet (10 mg total) by mouth at bedtime.  Dispense: 90 tablet; Refill: 2  8. Primary insomnia - zolpidem (AMBIEN) 10 MG tablet; Take 1 tablet (10 mg total) by mouth at bedtime as needed. for sleep  Dispense: 30 tablet; Refill: 2  9. Acquired hypothyroidism  10. Moderate episode of recurrent major depressive disorder (HCC)  11. Insomnia due to other mental disorder   12. Mild intermittent asthma, unspecified whether complicated  13. Hyperlipidemia, unspecified hyperlipidemia type  14. Encounter for immunization  - Flu vaccine trivalent PF, 6mos and older(Flulaval,Afluria,Fluarix,Fluzone)   Labs pending Patient reviewed in [place] controlled database, no flags noted. Contract and drug screen are up to date.  Continue current medications  Health Maintenance reviewed Diet and exercise encouraged  Follow up plan: 3 months   Jannifer Rodney, FNP

## 2022-12-13 ENCOUNTER — Other Ambulatory Visit: Payer: Self-pay | Admitting: Family

## 2022-12-24 ENCOUNTER — Encounter: Payer: Self-pay | Admitting: Family

## 2023-01-07 ENCOUNTER — Other Ambulatory Visit: Payer: Self-pay | Admitting: Family

## 2023-01-07 DIAGNOSIS — M722 Plantar fascial fibromatosis: Secondary | ICD-10-CM

## 2023-01-07 DIAGNOSIS — M25562 Pain in left knee: Secondary | ICD-10-CM

## 2023-01-22 ENCOUNTER — Other Ambulatory Visit: Payer: Self-pay | Admitting: Family

## 2023-02-07 ENCOUNTER — Telehealth (HOSPITAL_COMMUNITY): Payer: Self-pay

## 2023-02-07 NOTE — Telephone Encounter (Signed)
Pharmacy Patient Advocate Encounter   Received notification from CoverMyMeds that prior authorization for Zolpidem 10 mg tablet is required/requested.   Insurance verification completed.   The patient is insured through [facility] .   Per test claim: PA required; PA submitted to above mentioned insurance via CoverMyMeds Key/confirmation #/EOC [id number] Status is pending

## 2023-02-08 ENCOUNTER — Other Ambulatory Visit (HOSPITAL_COMMUNITY): Payer: Self-pay

## 2023-02-08 NOTE — Telephone Encounter (Signed)
Pharmacy Patient Advocate Encounter  Received notification from [facility] that Prior Authorization for ZOLPIDEM 10MG  has been APPROVED from 02/07/2023 to 02/07/2024. Ran test claim, Copay is $0.93. This test claim was processed through [facility]- copay amounts may vary at other pharmacies due to pharmacy/plan contracts, or as the patient moves through the different stages of their insurance plan.   PA #/Case ID/Reference #: [id number]

## 2023-02-11 ENCOUNTER — Encounter: Payer: Self-pay | Admitting: Family

## 2023-02-11 ENCOUNTER — Ambulatory Visit (INDEPENDENT_AMBULATORY_CARE_PROVIDER_SITE_OTHER): Payer: 59 | Admitting: Family

## 2023-02-11 ENCOUNTER — Other Ambulatory Visit (HOSPITAL_COMMUNITY): Payer: Self-pay

## 2023-02-11 VITALS — BP 139/84 | HR 89 | Temp 97.5°F | Ht 64.0 in | Wt 157.6 lb

## 2023-02-11 DIAGNOSIS — E785 Hyperlipidemia, unspecified: Secondary | ICD-10-CM | POA: Diagnosis not present

## 2023-02-11 DIAGNOSIS — Z79899 Other long term (current) drug therapy: Secondary | ICD-10-CM

## 2023-02-11 DIAGNOSIS — K219 Gastro-esophageal reflux disease without esophagitis: Secondary | ICD-10-CM

## 2023-02-11 DIAGNOSIS — F411 Generalized anxiety disorder: Secondary | ICD-10-CM | POA: Diagnosis not present

## 2023-02-11 DIAGNOSIS — F4322 Adjustment disorder with anxiety: Secondary | ICD-10-CM

## 2023-02-11 DIAGNOSIS — J4520 Mild intermittent asthma, uncomplicated: Secondary | ICD-10-CM | POA: Diagnosis not present

## 2023-02-11 DIAGNOSIS — E039 Hypothyroidism, unspecified: Secondary | ICD-10-CM

## 2023-02-11 DIAGNOSIS — M25561 Pain in right knee: Secondary | ICD-10-CM | POA: Diagnosis not present

## 2023-02-11 DIAGNOSIS — F5101 Primary insomnia: Secondary | ICD-10-CM

## 2023-02-11 DIAGNOSIS — G43809 Other migraine, not intractable, without status migrainosus: Secondary | ICD-10-CM

## 2023-02-11 DIAGNOSIS — F5105 Insomnia due to other mental disorder: Secondary | ICD-10-CM

## 2023-02-11 DIAGNOSIS — F331 Major depressive disorder, recurrent, moderate: Secondary | ICD-10-CM

## 2023-02-11 DIAGNOSIS — M25562 Pain in left knee: Secondary | ICD-10-CM

## 2023-02-11 MED ORDER — SUMATRIPTAN SUCCINATE 50 MG PO TABS: ORAL_TABLET | ORAL | 0 refills | Status: DC

## 2023-02-11 MED ORDER — ZOLPIDEM TARTRATE 10 MG PO TABS: 10.0000 mg | ORAL_TABLET | Freq: Every evening | ORAL | 2 refills | Status: DC | PRN

## 2023-02-11 MED ORDER — ALPRAZOLAM 1 MG PO TABS: 1.0000 mg | ORAL_TABLET | Freq: Two times a day (BID) | ORAL | 2 refills | Status: DC | PRN

## 2023-02-11 MED ORDER — MELOXICAM 15 MG PO TABS: 15.0000 mg | ORAL_TABLET | Freq: Every day | ORAL | 1 refills | Status: DC

## 2023-02-11 MED ORDER — ATORVASTATIN CALCIUM 10 MG PO TABS: 10.0000 mg | ORAL_TABLET | Freq: Every day | ORAL | 3 refills | Status: DC

## 2023-02-11 NOTE — Patient Instructions (Signed)

## 2023-02-11 NOTE — Progress Notes (Signed)
Subjective:    Patient ID: Phyllis Parker, female    DOB: 08/20/1960, 63 y.o.   MRN: [id number]  Chief Complaint  Patient presents with   Medical Management of Chronic Issues    PT is visibly upset, pt states she has been feeling helpless, tired, tired. Concerned about weight gain    PT presents to the office today for  chronic follow up. She reports her GAD and depression are worse.    Having a lot of stress with daughter who has bipolar. Reports his son was addicted to meth. He is clean now. Her father is sick and emotional. She just feels overwhelmed.  Asthma She complains of wheezing. There is no cough or shortness of breath. This is a chronic problem. The current episode started more than 1 year ago. The problem occurs intermittently. Associated symptoms include heartburn. She reports moderate improvement on treatment. Her past medical history is significant for asthma.  Gastroesophageal Reflux She complains of belching, heartburn and wheezing. She reports no coughing. This is a chronic problem. The current episode started more than 1 year ago. The problem occurs occasionally. Associated symptoms include fatigue. She has tried a PPI for the symptoms. The treatment provided moderate relief.  Thyroid Problem Presents for follow-up visit. Symptoms include anxiety, depressed mood, fatigue, palpitations and weight gain. Patient reports no constipation or diarrhea. The symptoms have been stable.  Insomnia Primary symptoms: difficulty falling asleep, frequent awakening.   The current episode started more than one year. The problem occurs intermittently. Past treatments include medication. The treatment provided moderate relief. PMH includes: depression.   Hyperlipidemia This is a chronic problem. The current episode started more than 1 year ago. The problem is uncontrolled. Recent lipid tests were reviewed and are high. Pertinent negatives include no shortness of breath. Current antihyperlipidemic  treatment includes statins. The current treatment provides moderate improvement of lipids. Risk factors for coronary artery disease include dyslipidemia, hypertension, a sedentary lifestyle and post-menopausal.  Anxiety Presents for follow-up visit. Symptoms include decreased concentration, depressed mood, excessive worry, hyperventilation, insomnia, irritability, muscle tension, nervous/anxious behavior, obsessions, palpitations, panic and restlessness. Patient reports no shortness of breath or suicidal ideas. Symptoms occur most days. The severity of symptoms is mild.   Her past medical history is significant for asthma.  Depression        This is a chronic problem.  The current episode started more than 1 year ago.   The problem occurs intermittently.  Associated symptoms include decreased concentration, fatigue, helplessness, hopelessness, insomnia, restlessness and sad.  Associated symptoms include no suicidal ideas.  Past treatments include SNRIs - Serotonin and norepinephrine reuptake inhibitors.  Past medical history includes thyroid problem and anxiety.       Review of Systems  Constitutional:  Positive for fatigue, irritability and weight gain.  Respiratory:  Positive for wheezing. Negative for cough and shortness of breath.   Cardiovascular:  Positive for palpitations.  Gastrointestinal:  Positive for heartburn. Negative for constipation and diarrhea.  Psychiatric/Behavioral:  Positive for decreased concentration and depression. Negative for suicidal ideas. The patient is nervous/anxious and has insomnia.   All other systems reviewed and are negative.      Objective:   Physical Exam Vitals reviewed.  Constitutional:      General: She is not in acute distress.    Appearance: She is well-developed.  HENT:     Head: Normocephalic and atraumatic.     Right Ear: Tympanic membrane normal.     Left Ear: Tympanic  membrane normal.  Eyes:     Pupils: Pupils are equal, round, and  reactive to light.  Neck:     Thyroid: No thyromegaly.  Cardiovascular:     Rate and Rhythm: Normal rate and regular rhythm.     Heart sounds: Normal heart sounds. No murmur heard. Pulmonary:     Effort: Pulmonary effort is normal. No respiratory distress.     Breath sounds: Normal breath sounds. No wheezing.  Abdominal:     General: Bowel sounds are normal. There is no distension.     Palpations: Abdomen is soft.     Tenderness: There is no abdominal tenderness.  Musculoskeletal:        General: No tenderness. Normal range of motion.     Cervical back: Normal range of motion and neck supple.  Skin:    General: Skin is warm and dry.  Neurological:     Mental Status: She is alert and oriented to person, place, and time.     Cranial Nerves: No cranial nerve deficit.     Deep Tendon Reflexes: Reflexes are normal and symmetric.  Psychiatric:        Mood and Affect: Mood is anxious and depressed. Affect is tearful. Affect is not flat.        Behavior: Behavior normal.        Thought Content: Thought content normal.        Judgment: Judgment normal.     Comments: Crying        BP 139/84   Pulse 89   Temp (!) 97.5 F (36.4 C)   Ht 5\' 4"  (1.626 m)   Wt 157 lb 9.6 oz (71.5 kg)   SpO2 99%   BMI 27.05 kg/m      Assessment & Plan:  [name] comes in today with chief complaint of Medical Management of Chronic Issues (PT is visibly upset, pt states she has been feeling helpless, tired, tired. Concerned about weight gain )   Diagnosis and orders addressed:  1. Controlled substance agreement signed - ToxASSURE Select 13 (MW), Urine - ALPRAZolam (XANAX) 1 MG tablet; Take 1 tablet (1 mg total) by mouth 2 (two) times daily as needed for anxiety.  Dispense: 60 tablet; Refill: 2 - zolpidem (AMBIEN) 10 MG tablet; Take 1 tablet (10 mg total) by mouth at bedtime as needed. for sleep  Dispense: 30 tablet; Refill: 2 - CMP14+EGFR  2. GAD (generalized anxiety disorder) - ToxASSURE  Select 13 (MW), Urine - ALPRAZolam (XANAX) 1 MG tablet; Take 1 tablet (1 mg total) by mouth 2 (two) times daily as needed for anxiety.  Dispense: 60 tablet; Refill: 2 - Ambulatory referral to Psychiatry - CMP14+EGFR - TSH  3. Arthralgia of both lower legs - meloxicam (MOBIC) 15 MG tablet; Take 1 tablet (15 mg total) by mouth daily.  Dispense: 30 tablet; Refill: 1 - CMP14+EGFR  4. Other migraine without status migrainosus, not intractable - SUMAtriptan (IMITREX) 50 MG tablet; TAKE 1 OR 2 TABLETS AT ONSET OF MIGRAINE. MAY REPEAT IN 2 HOURS IF NEEDED  Dispense: 9 tablet; Refill: 0 - CMP14+EGFR  5. Primary insomnia - zolpidem (AMBIEN) 10 MG tablet; Take 1 tablet (10 mg total) by mouth at bedtime as needed. for sleep  Dispense: 30 tablet; Refill: 2 - Ambulatory referral to Psychiatry - CMP14+EGFR  6. Adjustment disorder with anxious mood (Primary) - Ambulatory referral to Psychiatry - CMP14+EGFR  7. Mild intermittent asthma, unspecified whether complicated - CMP14+EGFR  8. Moderate episode of recurrent  major depressive disorder (HCC) - Ambulatory referral to Psychiatry - CMP14+EGFR  9. Gastroesophageal reflux disease without esophagitis - CMP14+EGFR  10. Hyperlipidemia, unspecified hyperlipidemia type - CMP14+EGFR  11. Acquired hypothyroidism - CMP14+EGFR - TSH  12. Insomnia due to other mental disorder - Ambulatory referral to Psychiatry - CMP14+EGFR  Labs pending Patient reviewed in [place] controlled database, no flags noted. Contract and drug screen are up to date.  Referral to [facility]  Continue current medications  Health Maintenance reviewed Diet and exercise encouraged  Follow up plan: 3 months   Jannifer Rodney, FNP

## 2023-02-12 ENCOUNTER — Other Ambulatory Visit: Payer: Self-pay | Admitting: Family

## 2023-02-12 LAB — CMP14+EGFR
ALT: 15 [IU]/L (ref 0–32)
AST: 27 [IU]/L (ref 0–40)
Albumin: 4.8 g/dL (ref 3.8–4.9)
Alkaline Phosphatase: 75 [IU]/L (ref 44–121)
BUN/Creatinine Ratio: 22 (ref 12–28)
BUN: 17 mg/dL (ref 8–27)
Bilirubin Total: 0.3 mg/dL (ref 0.0–1.2)
CO2: 22 mmol/L (ref 20–29)
Calcium: 9.4 mg/dL (ref 8.7–10.3)
Chloride: 102 mmol/L (ref 96–106)
Creatinine, Ser: 0.77 mg/dL (ref 0.57–1.00)
Globulin, Total: 2.2 g/dL (ref 1.5–4.5)
Glucose: 94 mg/dL (ref 70–99)
Potassium: 4.1 mmol/L (ref 3.5–5.2)
Sodium: 139 mmol/L (ref 134–144)
Total Protein: 7 g/dL (ref 6.0–8.5)
eGFR: 88 mL/min/{1.73_m2} (ref >59)

## 2023-02-12 LAB — TSH: TSH: 6.93 u[IU]/mL — ABNORMAL HIGH (ref 0.450–4.500)

## 2023-02-12 MED ORDER — LEVOTHYROXINE SODIUM 100 MCG PO TABS: 100.0000 ug | ORAL_TABLET | Freq: Every day | ORAL | 1 refills | Status: DC

## 2023-02-13 LAB — TOXASSURE SELECT 13 (MW), URINE

## 2023-02-28 ENCOUNTER — Other Ambulatory Visit: Payer: Self-pay | Admitting: Family

## 2023-02-28 DIAGNOSIS — F331 Major depressive disorder, recurrent, moderate: Secondary | ICD-10-CM

## 2023-02-28 DIAGNOSIS — F411 Generalized anxiety disorder: Secondary | ICD-10-CM

## 2023-04-11 ENCOUNTER — Ambulatory Visit (INDEPENDENT_AMBULATORY_CARE_PROVIDER_SITE_OTHER): Payer: 59 | Admitting: Family

## 2023-04-11 ENCOUNTER — Encounter: Payer: Self-pay | Admitting: Family

## 2023-04-11 VITALS — BP 139/82 | HR 73 | Temp 97.8°F | Ht 64.0 in | Wt 158.6 lb

## 2023-04-11 DIAGNOSIS — E039 Hypothyroidism, unspecified: Secondary | ICD-10-CM

## 2023-04-11 DIAGNOSIS — F4322 Adjustment disorder with anxiety: Secondary | ICD-10-CM | POA: Diagnosis not present

## 2023-04-11 DIAGNOSIS — F411 Generalized anxiety disorder: Secondary | ICD-10-CM | POA: Diagnosis not present

## 2023-04-11 DIAGNOSIS — J4520 Mild intermittent asthma, uncomplicated: Secondary | ICD-10-CM | POA: Diagnosis not present

## 2023-04-11 DIAGNOSIS — F331 Major depressive disorder, recurrent, moderate: Secondary | ICD-10-CM

## 2023-04-11 MED ORDER — CETIRIZINE HCL 10 MG PO TABS: 10.0000 mg | ORAL_TABLET | Freq: Every day | ORAL | 3 refills | Status: DC

## 2023-04-11 NOTE — Progress Notes (Signed)
 Subjective:    Patient ID: Phyllis Parker, female    DOB: 13-Oct-1960, 63 y.o.   MRN: [id number]  Chief Complaint  Patient presents with   Hypothyroidism   PT presents to the office today to recheck hypothyroid. She was seen on 02/11/23 and we increased her her levothyroxine to 100 mcg from 75 mcg.   She reports her anxiety and depression are much better than her last visit. She never followed up with behavioral health.  Thyroid Problem Presents for follow-up visit. Symptoms include anxiety, depressed mood, fatigue and palpitations. Patient reports no constipation, diarrhea or dry skin. The symptoms have been stable.  Anxiety Presents for follow-up visit. Symptoms include decreased concentration, depressed mood, excessive worry, hyperventilation, irritability, nervous/anxious behavior, palpitations, panic and restlessness. Symptoms occur most days. The severity of symptoms is severe.   Her past medical history is significant for asthma.  Depression        This is a chronic problem.  The current episode started more than 1 year ago.   The problem occurs intermittently.  Associated symptoms include decreased concentration, fatigue and restlessness.  Associated symptoms include no helplessness and no hopelessness.  Past treatments include SNRIs - Serotonin and norepinephrine reuptake inhibitors.  Past medical history includes thyroid problem and anxiety.   Asthma She complains of cough and wheezing. This is a chronic problem. The current episode started more than 1 year ago. The cough is dry. Her past medical history is significant for asthma.      Review of Systems  Constitutional:  Positive for fatigue and irritability.  Respiratory:  Positive for cough and wheezing.   Cardiovascular:  Positive for palpitations.  Gastrointestinal:  Negative for constipation and diarrhea.  Psychiatric/Behavioral:  Positive for decreased concentration and depression. The patient is nervous/anxious.   All  other systems reviewed and are negative.   Social History   Socioeconomic History   Marital status: Married    Spouse name: Not on file   Number of children: Not on file   Years of education: Not on file   Highest education level: Not on file  Occupational History   Not on file  Tobacco Use   Smoking status: Never   Smokeless tobacco: Never  Vaping Use   Vaping status: Never Used  Substance and Sexual Activity   Alcohol use: No   Drug use: No   Sexual activity: Not on file  Other Topics Concern   Not on file  Social History Narrative   ** Merged History Encounter **       Social Drivers of Health   Financial Resource Strain: Not on file  Food Insecurity: Not on file  Transportation Needs: Not on file  Physical Activity: Not on file  Stress: Not on file  Social Connections: Not on file   Family History  Problem Relation Age of Onset   Cancer Mother    Prostate cancer Father         Objective:   Physical Exam Vitals reviewed.  Constitutional:      General: She is not in acute distress.    Appearance: She is well-developed.  HENT:     Head: Normocephalic and atraumatic.     Right Ear: Tympanic membrane normal.     Left Ear: Tympanic membrane normal.  Eyes:     Pupils: Pupils are equal, round, and reactive to light.  Neck:     Thyroid: No thyromegaly.  Cardiovascular:     Rate and Rhythm: Normal rate and  regular rhythm.     Heart sounds: Normal heart sounds. No murmur heard. Pulmonary:     Effort: Pulmonary effort is normal. No respiratory distress.     Breath sounds: Normal breath sounds. No wheezing.  Abdominal:     General: Bowel sounds are normal. There is no distension.     Palpations: Abdomen is soft.     Tenderness: There is no abdominal tenderness.  Musculoskeletal:        General: No tenderness. Normal range of motion.     Cervical back: Normal range of motion and neck supple.  Skin:    General: Skin is warm and dry.  Neurological:      Mental Status: She is alert and oriented to person, place, and time.     Cranial Nerves: No cranial nerve deficit.     Deep Tendon Reflexes: Reflexes are normal and symmetric.  Psychiatric:        Behavior: Behavior normal.        Thought Content: Thought content normal.        Judgment: Judgment normal.       BP 139/82   Pulse 73   Temp 97.8 F (36.6 C) (Temporal)   Ht 5\' 4"  (1.626 m)   Wt 158 lb 9.6 oz (71.9 kg)   SpO2 98%   BMI 27.22 kg/m      Assessment & Plan:  Kilah Drahos comes in today with chief complaint of Hypothyroidism   Diagnosis and orders addressed:  1. Acquired hypothyroidism (Primary) - TSH  2. Adjustment disorder with anxious mood  3. Moderate episode of recurrent major depressive disorder (HCC)  4. GAD (generalized anxiety disorder)  5. Mild intermittent asthma, unspecified whether complicated - cetirizine (ZYRTEC) 10 MG tablet; Take 1 tablet (10 mg total) by mouth daily.  Dispense: 90 tablet; Refill: 3   Labs pending Continue current medications  Restart zyrtec 10 mg  Health Maintenance reviewed Diet and exercise encouraged  Return in about 1 month (around 05/11/2023), or if symptoms worsen or fail to improve.    Jannifer Rodney, FNP

## 2023-04-11 NOTE — Patient Instructions (Signed)

## 2023-04-12 LAB — TSH: TSH: 0.78 u[IU]/mL (ref 0.450–4.500)

## 2023-04-25 ENCOUNTER — Ambulatory Visit: Payer: Self-pay

## 2023-04-25 NOTE — Telephone Encounter (Signed)
 Chief Complaint: Increased frequency  Symptoms: Burning with urination, increased frequency and urgency,  Frequency: constant, onset Tuesday  Pertinent Negatives: Patient denies back pain, side pain, fever, foul odor Disposition: [] ED /[x] Urgent Care (no appt availability in office) / [] Appointment(In office/virtual)/ []  [facility]/ [] Home Care/ [] Refused Recommended Disposition /[] [facility]/ []  Follow-up with PCP Additional Notes: Patient states she noticed an increase in frequency and urgency when urinating on Tuesday morning along with burning. Patient states she has been drinking cranberry juice and water without improvement. Care advice was given and urgent care recommended due to office closure tomorrow. Patient stated she would go to urgent care this evening or tomorrow morning.   Copied from CRM [id number]. Topic: Clinical - Red Word Triage >> Apr 25, 2023  3:52 PM Donald Frost wrote: Red Word that prompted transfer to Nurse Triage: The patient called in stating she believes she has a UTI. She has been having pain, urgency, frequency and burning. She says it has been a long time since she has had a UTI but it is not good. I will transfer her to E2C2 NT as her office is closed tomorrow. Reason for Disposition  Urinating more frequently than usual (i.e., frequency)  Answer Assessment - Initial Assessment Questions 1. SYMPTOM: "What's the main symptom you're concerned about?" (e.g., frequency, incontinence)     Frequency 2. ONSET: "When did the  frequency  start?"     Tuesday morning  3. PAIN: "Is there any pain?" If Yes, ask: "How bad is it?" (Scale: 1-10; mild, moderate, severe)     Moderate 4. CAUSE: "What do you think is causing the symptoms?"     Maybe a UTI 5. OTHER SYMPTOMS: "Do you have any other symptoms?" (e.g., blood in urine, fever, flank pain, pain with urination)     Burning, increased urgency and frequency 6. PREGNANCY: "Is there any chance you are  pregnant?" "When was your last menstrual period?"     N/A  Protocols used: Urinary Symptoms-A-AH

## 2023-04-26 ENCOUNTER — Ambulatory Visit
Admission: EM | Admit: 2023-04-26 | Discharge: 2023-04-26 | Disposition: A | Discharge status: Home / Self Care | Attending: Family Medicine | Admitting: Family Medicine

## 2023-04-26 ENCOUNTER — Ambulatory Visit: Payer: Self-pay

## 2023-04-26 DIAGNOSIS — R300 Dysuria: Secondary | ICD-10-CM

## 2023-04-26 DIAGNOSIS — R350 Frequency of micturition: Secondary | ICD-10-CM | POA: Diagnosis not present

## 2023-04-26 LAB — POCT URINALYSIS DIP (MANUAL ENTRY)
Bilirubin, UA: NEGATIVE
Blood, UA: NEGATIVE
Glucose, UA: NEGATIVE mg/dL
Ketones, POC UA: NEGATIVE mg/dL
Leukocytes, UA: NEGATIVE
Nitrite, UA: NEGATIVE
Spec Grav, UA: 1.025 (ref 1.010–1.025)
Urobilinogen, UA: 0.2 U/dL
pH, UA: 5 (ref 5.0–8.0)

## 2023-04-26 NOTE — ED Triage Notes (Signed)
 Pt reports she has burning and pain with urination, bladder aches, and frequent urination x 3 days

## 2023-04-26 NOTE — ED Provider Notes (Signed)
 [facility]    CSN: [id number] Arrival date & time: 04/26/23  1000      History   Chief Complaint No chief complaint on file.   HPI Earlee Herald is a 63 y.o. female.   Patient presenting today with 3-day history of dysuria, urinary frequency, suprapubic pressure.  Denies fever, chills, flank pain, nausea, vomiting, hematuria, vaginal symptoms.  So far trying Cystex with mild temporary benefit.  No known history of urinary tract infections apart from about 25 years ago thinks that she may have had 1.  When asked, does endorse not drinking much water at all.    Past Medical History:  Diagnosis Date   ADD (attention deficit disorder)    Anxiety    Depression    Thyroid  disease     Patient Active Problem List   Diagnosis Date Noted   Plantar fasciitis 02/08/2022   Hyperlipidemia 03/10/2021   Asthma 12/08/2020   Controlled substance agreement signed 09/06/2020   GAD (generalized anxiety disorder) 02/04/2019   Elevated lipoprotein(a) 02/04/2019   Insomnia due to other mental disorder 12/18/2017   Osteopenia 09/17/2017   Depression 11/29/2015   Benign paroxysmal positional vertigo 11/29/2015   Adjustment disorder with anxious mood 11/02/2015   Attention deficit hyperactivity disorder (ADHD), predominantly inattentive type 11/02/2015   Gastroesophageal reflux disease without esophagitis 11/02/2015   Hypothyroidism 11/02/2015   Arthralgia of both lower legs 11/02/2015    History reviewed. No pertinent surgical history.  OB History   No obstetric history on file.      Home Medications    Prior to Admission medications   Medication Sig Start Date End Date Taking? Authorizing Provider  albuterol  (VENTOLIN  HFA) 108 (90 Base) MCG/ACT inhaler Inhale 2 puffs into the lungs every 6 hours as needed for wheezing or shortness of breath. 11/09/22   Yevette Hem, FNP  ALPRAZolam  (XANAX ) 1 MG tablet Take 1 tablet (1 mg total) by mouth 2 (two) times daily as needed  for anxiety. 02/11/23   Tommas Fragmin A, FNP  atorvastatin  (LIPITOR) 10 MG tablet Take 1 tablet (10 mg total) by mouth daily. 02/11/23   Yevette Hem, FNP  budesonide -formoterol  (SYMBICORT ) 80-4.5 MCG/ACT inhaler Inhale 2 puffs into the lungs 2 (two) times daily. 12/08/20   Yevette Hem, FNP  busPIRone  (BUSPAR ) 15 MG tablet TAKE ONE TABLET THREE TIMES DAILY 01/22/23   Tommas Fragmin A, FNP  cetirizine  (ZYRTEC ) 10 MG tablet Take 1 tablet (10 mg total) by mouth daily. 04/11/23   Yevette Hem, FNP  cyanocobalamin  (VITAMIN B12) 1000 MCG/ML injection Inject 1 mL (1,000 mcg total) into the muscle every 30 (thirty) days. 09/07/21   Yevette Hem, FNP  desvenlafaxine  (PRISTIQ ) 100 MG 24 hr tablet TAKE ONE TABLET ONCE DAILY 02/28/23   Tommas Fragmin A, FNP  dicyclomine  (BENTYL ) 10 MG capsule TAKE 1 CAPSULE FOUR TIMES DAILY BEFORE MEALS AND AT BEDTIME AS NEEDED 04/06/21   Tommas Fragmin A, FNP  esomeprazole  (NEXIUM ) 40 MG capsule TAKE 1 CAPSULE DAILY AT NOON 11/09/22   Tommas Fragmin A, FNP  levothyroxine  (SYNTHROID ) 100 MCG tablet Take 1 tablet (100 mcg total) by mouth daily. 02/12/23 02/12/24  Tommas Fragmin A, FNP  meloxicam  (MOBIC ) 15 MG tablet Take 1 tablet (15 mg total) by mouth daily. 02/11/23   Tommas Fragmin A, FNP  SUMAtriptan  (IMITREX ) 50 MG tablet TAKE 1 OR 2 TABLETS AT ONSET OF MIGRAINE. MAY REPEAT IN 2 HOURS IF NEEDED 02/11/23   Yevette Hem, FNP  zolpidem  (AMBIEN ) 10 MG tablet Take 1 tablet (10 mg total) by mouth at bedtime as needed. for sleep 02/11/23   Yevette Hem, FNP    Family History Family History  Problem Relation Age of Onset   Cancer Mother    Prostate cancer Father     Social History Social History   Tobacco Use   Smoking status: Never   Smokeless tobacco: Never  Vaping Use   Vaping status: Never Used  Substance Use Topics   Alcohol use: No   Drug use: No     Allergies   Shingrix [zoster vac recomb adjuvanted], Sulfa antibiotics, and Vioxx  [rofecoxib]   Review of Systems Review of Systems PER HPI  Physical Exam Triage Vital Signs ED Triage Vitals  Encounter Vitals Group     BP 04/26/23 1004 121/82     Systolic BP Percentile --      Diastolic BP Percentile --      Pulse Rate 04/26/23 1004 91     Resp 04/26/23 1004 16     Temp 04/26/23 1004 (!) 97.4 F (36.3 C)     Temp Source 04/26/23 1004 Oral     SpO2 04/26/23 1004 96 %     Weight --      Height --      Head Circumference --      Peak Flow --      Pain Score 04/26/23 1006 4     Pain Loc --      Pain Education --      Exclude from Growth Chart --    No data found.  Updated Vital Signs BP 121/82 (BP Location: Right Arm)   Pulse 91   Temp (!) 97.4 F (36.3 C) (Oral)   Resp 16   SpO2 96%   Visual Acuity Right Eye Distance:   Left Eye Distance:   Bilateral Distance:    Right Eye Near:   Left Eye Near:    Bilateral Near:     Physical Exam Vitals and nursing note reviewed.  Constitutional:      Appearance: Normal appearance. She is not ill-appearing.  HENT:     Head: Atraumatic.  Eyes:     Extraocular Movements: Extraocular movements intact.     Conjunctiva/sclera: Conjunctivae normal.  Cardiovascular:     Rate and Rhythm: Normal rate.  Pulmonary:     Effort: Pulmonary effort is normal.  Abdominal:     General: Bowel sounds are normal. There is no distension.     Palpations: Abdomen is soft.     Tenderness: There is no abdominal tenderness. There is no right CVA tenderness, left CVA tenderness or guarding.  Musculoskeletal:        General: Normal range of motion.     Cervical back: Normal range of motion and neck supple.  Skin:    General: Skin is warm and dry.  Neurological:     Mental Status: She is alert and oriented to person, place, and time.  Psychiatric:        Mood and Affect: Mood normal.        Thought Content: Thought content normal.        Judgment: Judgment normal.      UC Treatments / Results  Labs (all labs  ordered are listed, but only abnormal results are displayed) Labs Reviewed  POCT URINALYSIS DIP (MANUAL ENTRY) - Abnormal; Notable for the following components:      Result Value   Protein Ur, POC  trace (*)    All other components within normal limits    EKG   Radiology No results found.  Procedures Procedures (including critical care time)  Medications Ordered in UC Medications - No data to display  Initial Impression / Assessment and Plan / UC Course  I have reviewed the triage vital signs and the nursing notes.  Pertinent labs & imaging results that were available during my care of the patient were reviewed by me and considered in my medical decision making (see chart for details).     Urinalysis today without evidence of urinary tract infection or other significant abnormalities.  She does endorse not drinking much water, increase fluid intake, and may use Azo as needed, probiotics, supportive home care.  Return for worsening symptoms.  Final Clinical Impressions(s) / UC Diagnoses   Final diagnoses:  Dysuria  Urinary frequency     Discharge Instructions      Your urine test did not show a urinary tract infection today.  Increase your water intake significantly, and you may take Azo as needed, probiotics and avoid sugary caffeinated beverages.  Follow-up for worsening symptoms.    ED Prescriptions   None    PDMP not reviewed this encounter.   Corbin Dess, [place] 04/26/23 1453

## 2023-04-26 NOTE — Discharge Instructions (Signed)
 Your urine test did not show a urinary tract infection today.  Increase your water intake significantly, and you may take Azo as needed, probiotics and avoid sugary caffeinated beverages.  Follow-up for worsening symptoms.

## 2023-04-30 ENCOUNTER — Other Ambulatory Visit: Payer: Self-pay | Admitting: Family

## 2023-05-06 NOTE — Patient Instructions (Incomplete)

## 2023-05-07 ENCOUNTER — Encounter: Payer: Self-pay | Admitting: Family

## 2023-05-07 ENCOUNTER — Ambulatory Visit (INDEPENDENT_AMBULATORY_CARE_PROVIDER_SITE_OTHER): Payer: 59 | Admitting: Family

## 2023-05-07 VITALS — BP 115/78 | HR 81 | Temp 97.7°F | Ht 64.0 in | Wt 157.0 lb

## 2023-05-07 DIAGNOSIS — F5101 Primary insomnia: Secondary | ICD-10-CM

## 2023-05-07 DIAGNOSIS — M25562 Pain in left knee: Secondary | ICD-10-CM | POA: Diagnosis not present

## 2023-05-07 DIAGNOSIS — F331 Major depressive disorder, recurrent, moderate: Secondary | ICD-10-CM | POA: Diagnosis not present

## 2023-05-07 DIAGNOSIS — F5105 Insomnia due to other mental disorder: Secondary | ICD-10-CM

## 2023-05-07 DIAGNOSIS — Z0000 Encounter for general adult medical examination without abnormal findings: Secondary | ICD-10-CM

## 2023-05-07 DIAGNOSIS — J4520 Mild intermittent asthma, uncomplicated: Secondary | ICD-10-CM | POA: Diagnosis not present

## 2023-05-07 DIAGNOSIS — F411 Generalized anxiety disorder: Secondary | ICD-10-CM

## 2023-05-07 DIAGNOSIS — K219 Gastro-esophageal reflux disease without esophagitis: Secondary | ICD-10-CM | POA: Diagnosis not present

## 2023-05-07 DIAGNOSIS — F99 Mental disorder, not otherwise specified: Secondary | ICD-10-CM

## 2023-05-07 DIAGNOSIS — E785 Hyperlipidemia, unspecified: Secondary | ICD-10-CM | POA: Diagnosis not present

## 2023-05-07 DIAGNOSIS — E538 Deficiency of other specified B group vitamins: Secondary | ICD-10-CM | POA: Diagnosis not present

## 2023-05-07 DIAGNOSIS — E039 Hypothyroidism, unspecified: Secondary | ICD-10-CM

## 2023-05-07 DIAGNOSIS — M25561 Pain in right knee: Secondary | ICD-10-CM

## 2023-05-07 DIAGNOSIS — Z79899 Other long term (current) drug therapy: Secondary | ICD-10-CM

## 2023-05-07 DIAGNOSIS — Z0001 Encounter for general adult medical examination with abnormal findings: Secondary | ICD-10-CM | POA: Diagnosis not present

## 2023-05-07 MED ORDER — ZOLPIDEM TARTRATE 10 MG PO TABS: 10.0000 mg | ORAL_TABLET | Freq: Every evening | ORAL | 2 refills | Status: DC | PRN

## 2023-05-07 MED ORDER — ESOMEPRAZOLE MAGNESIUM 40 MG PO CPDR: DELAYED_RELEASE_CAPSULE | ORAL | 1 refills | Status: DC

## 2023-05-07 MED ORDER — ALPRAZOLAM 1 MG PO TABS: 1.0000 mg | ORAL_TABLET | Freq: Two times a day (BID) | ORAL | 2 refills | Status: DC | PRN

## 2023-05-07 MED ORDER — DICYCLOMINE HCL 10 MG PO CAPS
ORAL_CAPSULE | ORAL | 0 refills | Status: DC
Start: 2023-05-07 — End: 2023-07-30

## 2023-05-07 MED ORDER — BUSPIRONE HCL 15 MG PO TABS: 15.0000 mg | ORAL_TABLET | Freq: Three times a day (TID) | ORAL | 0 refills | Status: DC

## 2023-05-07 MED ORDER — DESVENLAFAXINE SUCCINATE ER 100 MG PO TB24: 100.0000 mg | ORAL_TABLET | Freq: Every day | ORAL | 0 refills | Status: DC

## 2023-05-07 MED ORDER — MELOXICAM 15 MG PO TABS: 15.0000 mg | ORAL_TABLET | Freq: Every day | ORAL | 1 refills | Status: DC

## 2023-05-07 NOTE — Progress Notes (Signed)
 Subjective:    Patient ID: Phyllis Parker, female    DOB: 11/15/1960, 63 y.o.   MRN: [id number]  Chief Complaint  Patient presents with   Medical Management of Chronic Issues   PT presents to the office today for CPE. She reports her GAD and depression are stable.    Having a lot of stress with daughter who has bipolar. Reports his son was addicted to meth. He is clean now. Her father is sick and emotional. She just feels overwhelmed.  Asthma There is no cough, shortness of breath or wheezing. This is a chronic problem. The current episode started more than 1 year ago. The problem occurs intermittently. Associated symptoms include heartburn. She reports moderate improvement on treatment. Her past medical history is significant for asthma.  Gastroesophageal Reflux She complains of belching and heartburn. She reports no coughing or no wheezing. This is a chronic problem. The current episode started more than 1 year ago. The problem occurs occasionally. The symptoms are aggravated by certain foods. Associated symptoms include fatigue. She has tried a PPI for the symptoms. The treatment provided moderate relief.  Thyroid  Problem Presents for follow-up visit. Symptoms include anxiety, depressed mood, fatigue and weight gain. Patient reports no constipation, diarrhea or palpitations. The symptoms have been stable.  Insomnia Primary symptoms: difficulty falling asleep, frequent awakening.   The current episode started more than one year. The onset quality is gradual. The problem occurs intermittently. Past treatments include medication. The treatment provided moderate relief. PMH includes: depression.   Hyperlipidemia This is a chronic problem. The current episode started more than 1 year ago. The problem is uncontrolled. Recent lipid tests were reviewed and are high. Pertinent negatives include no shortness of breath. Current antihyperlipidemic treatment includes statins. The current treatment provides  moderate improvement of lipids. Risk factors for coronary artery disease include dyslipidemia, hypertension, a sedentary lifestyle and post-menopausal.  Anxiety Presents for follow-up visit. Symptoms include decreased concentration, depressed mood, excessive worry, hyperventilation, insomnia, irritability, muscle tension, nervous/anxious behavior, obsessions, panic and restlessness. Patient reports no palpitations, shortness of breath or suicidal ideas. Symptoms occur most days. The severity of symptoms is mild.   Her past medical history is significant for asthma.  Depression        This is a chronic problem.  The current episode started more than 1 year ago.   The problem occurs intermittently.  Associated symptoms include decreased concentration, fatigue, helplessness, hopelessness, insomnia, restlessness and sad.  Associated symptoms include no suicidal ideas.  Past treatments include SNRIs - Serotonin and norepinephrine reuptake inhibitors.  Past medical history includes thyroid  problem and anxiety.       Review of Systems  Constitutional:  Positive for fatigue, irritability and weight gain.  Respiratory:  Negative for cough, shortness of breath and wheezing.   Cardiovascular:  Negative for palpitations.  Gastrointestinal:  Positive for heartburn. Negative for constipation and diarrhea.  Psychiatric/Behavioral:  Positive for decreased concentration and depression. Negative for suicidal ideas. The patient is nervous/anxious and has insomnia.   All other systems reviewed and are negative.  Family History  Problem Relation Age of Onset   Cancer Mother    Prostate cancer Father    Social History   Socioeconomic History   Marital status: Married    Spouse name: Not on file   Number of children: Not on file   Years of education: Not on file   Highest education level: Not on file  Occupational History   Not on file  Tobacco Use   Smoking status: Never   Smokeless tobacco: Never   Vaping Use   Vaping status: Never Used  Substance and Sexual Activity   Alcohol use: No   Drug use: No   Sexual activity: Not on file  Other Topics Concern   Not on file  Social History Narrative   ** Merged History Encounter **       Social Drivers of Corporate investment banker Strain: Not on file  Food Insecurity: Not on file  Transportation Needs: Not on file  Physical Activity: Not on file  Stress: Not on file  Social Connections: Not on file       Objective:   Physical Exam Vitals reviewed.  Constitutional:      General: She is not in acute distress.    Appearance: She is well-developed.  HENT:     Head: Normocephalic and atraumatic.     Right Ear: Tympanic membrane normal.     Left Ear: Tympanic membrane normal.  Eyes:     Pupils: Pupils are equal, round, and reactive to light.  Neck:     Thyroid : No thyromegaly.  Cardiovascular:     Rate and Rhythm: Normal rate and regular rhythm.     Heart sounds: Normal heart sounds. No murmur heard. Pulmonary:     Effort: Pulmonary effort is normal. No respiratory distress.     Breath sounds: Normal breath sounds. No wheezing.  Abdominal:     General: Bowel sounds are normal. There is no distension.     Palpations: Abdomen is soft.     Tenderness: There is no abdominal tenderness.  Musculoskeletal:        General: No tenderness. Normal range of motion.     Cervical back: Normal range of motion and neck supple.  Skin:    General: Skin is warm and dry.  Neurological:     Mental Status: She is alert and oriented to person, place, and time.     Cranial Nerves: No cranial nerve deficit.     Deep Tendon Reflexes: Reflexes are normal and symmetric.  Psychiatric:        Mood and Affect: Mood is anxious. Mood is not depressed. Affect is not flat or tearful.        Behavior: Behavior normal.        Thought Content: Thought content normal.        Judgment: Judgment normal.       BP 115/78   Pulse 81   Temp 97.7 F  (36.5 C)   Ht 5\' 4"  (1.626 m)   Wt 157 lb (71.2 kg)   SpO2 96%   BMI 26.95 kg/m      Assessment & Plan:  Phyllis Parker comes in today with chief complaint of Medical Management of Chronic Issues   Diagnosis and orders addressed:  1. Controlled substance agreement signed - ALPRAZolam  (XANAX ) 1 MG tablet; Take 1 tablet (1 mg total) by mouth 2 (two) times daily as needed for anxiety.  Dispense: 60 tablet; Refill: 2 - zolpidem  (AMBIEN ) 10 MG tablet; Take 1 tablet (10 mg total) by mouth at bedtime as needed. for sleep  Dispense: 30 tablet; Refill: 2 - CMP14+EGFR - CBC with Differential/Platelet  2. GAD (generalized anxiety disorder) - ALPRAZolam  (XANAX ) 1 MG tablet; Take 1 tablet (1 mg total) by mouth 2 (two) times daily as needed for anxiety.  Dispense: 60 tablet; Refill: 2 - desvenlafaxine  (PRISTIQ ) 100 MG 24 hr tablet; Take 1 tablet (  100 mg total) by mouth daily.  Dispense: 90 tablet; Refill: 0 - CMP14+EGFR - CBC with Differential/Platelet  3. Moderate episode of recurrent major depressive disorder (HCC) - desvenlafaxine  (PRISTIQ ) 100 MG 24 hr tablet; Take 1 tablet (100 mg total) by mouth daily.  Dispense: 90 tablet; Refill: 0 - CMP14+EGFR - CBC with Differential/Platelet  4. Gastroesophageal reflux disease without esophagitis - esomeprazole  (NEXIUM ) 40 MG capsule; TAKE 1 CAPSULE DAILY AT NOON  Dispense: 90 capsule; Refill: 1 - CMP14+EGFR - CBC with Differential/Platelet  5. Arthralgia of both lower legs - meloxicam  (MOBIC ) 15 MG tablet; Take 1 tablet (15 mg total) by mouth daily.  Dispense: 30 tablet; Refill: 1 - CMP14+EGFR - CBC with Differential/Platelet  6. Primary insomnia - zolpidem  (AMBIEN ) 10 MG tablet; Take 1 tablet (10 mg total) by mouth at bedtime as needed. for sleep  Dispense: 30 tablet; Refill: 2 - CMP14+EGFR - CBC with Differential/Platelet  7. Annual physical exam (Primary) - CMP14+EGFR - CBC with Differential/Platelet - Lipid panel - TSH - Vitamin  B12  8. Mild intermittent asthma, unspecified whether complicated - CMP14+EGFR  9. Hyperlipidemia, unspecified hyperlipidemia type  - CMP14+EGFR - Lipid panel  10. Acquired hypothyroidism - CMP14+EGFR  11. Insomnia due to other mental disorder - CMP14+EGFR  12. Vitamin B 12 deficiency - CMP14+EGFR - Vitamin B12   Labs pending Patient reviewed in [place] controlled database, no flags noted. Contract and drug screen are up to date.  [facility]erral to [facility]  Continue current medications  Health Maintenance reviewed Diet and exercise encouraged  Follow up plan: 3 months   Tommas Fragmin, FNP

## 2023-05-08 LAB — CBC WITH DIFFERENTIAL/PLATELET
Basophils Absolute: 0 10*3/uL (ref 0.0–0.2)
Basos: 0 %
EOS (ABSOLUTE): 0 10*3/uL (ref 0.0–0.4)
Eos: 0 %
Hematocrit: 34.2 % (ref 34.0–46.6)
Hemoglobin: 11.4 g/dL (ref 11.1–15.9)
Immature Grans (Abs): 0 10*3/uL (ref 0.0–0.1)
Immature Granulocytes: 0 %
Lymphocytes Absolute: 1.3 10*3/uL (ref 0.7–3.1)
Lymphs: 32 %
MCH: 29.9 pg (ref 26.6–33.0)
MCHC: 33.3 g/dL (ref 31.5–35.7)
MCV: 90 fL (ref 79–97)
Monocytes Absolute: 0.3 10*3/uL (ref 0.1–0.9)
Monocytes: 7 %
Neutrophils Absolute: 2.5 10*3/uL (ref 1.4–7.0)
Neutrophils: 61 %
Platelets: 241 10*3/uL (ref 150–450)
RBC: 3.81 x10E6/uL (ref 3.77–5.28)
RDW: 11.4 % — ABNORMAL LOW (ref 11.7–15.4)
WBC: 4.1 10*3/uL (ref 3.4–10.8)

## 2023-05-08 LAB — LIPID PANEL
Chol/HDL Ratio: 2.7 ratio (ref 0.0–4.4)
Cholesterol, Total: 243 mg/dL — ABNORMAL HIGH (ref 100–199)
HDL: 91 mg/dL (ref >39)
LDL Chol Calc (NIH): 134 mg/dL — ABNORMAL HIGH (ref 0–99)
Triglycerides: 105 mg/dL (ref 0–149)
VLDL Cholesterol Cal: 18 mg/dL (ref 5–40)

## 2023-05-08 LAB — CMP14+EGFR
ALT: 35 IU/L — ABNORMAL HIGH (ref 0–32)
AST: 40 IU/L (ref 0–40)
Albumin: 4.5 g/dL (ref 3.8–4.9)
Alkaline Phosphatase: 91 IU/L (ref 44–121)
BUN/Creatinine Ratio: 13 (ref 12–28)
BUN: 12 mg/dL (ref 8–27)
Bilirubin Total: 0.4 mg/dL (ref 0.0–1.2)
CO2: 25 mmol/L (ref 20–29)
Calcium: 9.9 mg/dL (ref 8.7–10.3)
Chloride: 103 mmol/L (ref 96–106)
Creatinine, Ser: 0.93 mg/dL (ref 0.57–1.00)
Globulin, Total: 2.2 g/dL (ref 1.5–4.5)
Glucose: 81 mg/dL (ref 70–99)
Potassium: 4.3 mmol/L (ref 3.5–5.2)
Sodium: 143 mmol/L (ref 134–144)
Total Protein: 6.7 g/dL (ref 6.0–8.5)
eGFR: 70 mL/min/{1.73_m2} (ref >59)

## 2023-05-08 LAB — TSH: TSH: 0.542 u[IU]/mL (ref 0.450–4.500)

## 2023-05-08 LAB — VITAMIN B12: Vitamin B-12: 334 pg/mL (ref 232–1245)

## 2023-05-15 ENCOUNTER — Other Ambulatory Visit: Payer: Self-pay | Admitting: Family

## 2023-05-15 DIAGNOSIS — Z1231 Encounter for screening mammogram for malignant neoplasm of breast: Secondary | ICD-10-CM

## 2023-05-20 ENCOUNTER — Inpatient Hospital Stay: Admission: RE | Admit: 2023-05-20 | Source: Ambulatory Visit

## 2023-05-21 ENCOUNTER — Ambulatory Visit: Payer: Self-pay

## 2023-07-30 ENCOUNTER — Ambulatory Visit (INDEPENDENT_AMBULATORY_CARE_PROVIDER_SITE_OTHER): Admitting: Family

## 2023-07-30 ENCOUNTER — Encounter: Payer: Self-pay | Admitting: Family

## 2023-07-30 VITALS — BP 123/82 | HR 73 | Temp 97.0°F | Ht 64.0 in | Wt 152.2 lb

## 2023-07-30 DIAGNOSIS — F331 Major depressive disorder, recurrent, moderate: Secondary | ICD-10-CM | POA: Diagnosis not present

## 2023-07-30 DIAGNOSIS — Z23 Encounter for immunization: Secondary | ICD-10-CM

## 2023-07-30 DIAGNOSIS — F411 Generalized anxiety disorder: Secondary | ICD-10-CM | POA: Diagnosis not present

## 2023-07-30 DIAGNOSIS — F99 Mental disorder, not otherwise specified: Secondary | ICD-10-CM | POA: Diagnosis not present

## 2023-07-30 DIAGNOSIS — J4520 Mild intermittent asthma, uncomplicated: Secondary | ICD-10-CM | POA: Diagnosis not present

## 2023-07-30 DIAGNOSIS — F5105 Insomnia due to other mental disorder: Secondary | ICD-10-CM | POA: Diagnosis not present

## 2023-07-30 DIAGNOSIS — G43809 Other migraine, not intractable, without status migrainosus: Secondary | ICD-10-CM | POA: Diagnosis not present

## 2023-07-30 DIAGNOSIS — F5101 Primary insomnia: Secondary | ICD-10-CM

## 2023-07-30 DIAGNOSIS — Z79899 Other long term (current) drug therapy: Secondary | ICD-10-CM | POA: Diagnosis not present

## 2023-07-30 DIAGNOSIS — E039 Hypothyroidism, unspecified: Secondary | ICD-10-CM | POA: Diagnosis not present

## 2023-07-30 DIAGNOSIS — E785 Hyperlipidemia, unspecified: Secondary | ICD-10-CM | POA: Diagnosis not present

## 2023-07-30 DIAGNOSIS — F4322 Adjustment disorder with anxiety: Secondary | ICD-10-CM

## 2023-07-30 DIAGNOSIS — K219 Gastro-esophageal reflux disease without esophagitis: Secondary | ICD-10-CM

## 2023-07-30 LAB — CMP14+EGFR
ALT: 9 IU/L (ref 0–32)
AST: 23 IU/L (ref 0–40)
Albumin: 4.6 g/dL (ref 3.9–4.9)
Alkaline Phosphatase: 91 IU/L (ref 44–121)
BUN/Creatinine Ratio: 20 (ref 12–28)
BUN: 17 mg/dL (ref 8–27)
Bilirubin Total: 0.4 mg/dL (ref 0.0–1.2)
CO2: 21 mmol/L (ref 20–29)
Calcium: 9.8 mg/dL (ref 8.7–10.3)
Chloride: 100 mmol/L (ref 96–106)
Creatinine, Ser: 0.85 mg/dL (ref 0.57–1.00)
Globulin, Total: 2.5 g/dL (ref 1.5–4.5)
Glucose: 71 mg/dL (ref 70–99)
Potassium: 4.4 mmol/L (ref 3.5–5.2)
Sodium: 138 mmol/L (ref 134–144)
Total Protein: 7.1 g/dL (ref 6.0–8.5)
eGFR: 78 mL/min/1.73 (ref >59)

## 2023-07-30 MED ORDER — ALPRAZOLAM 1 MG PO TABS: 1.0000 mg | ORAL_TABLET | Freq: Two times a day (BID) | ORAL | 2 refills | Status: DC | PRN

## 2023-07-30 MED ORDER — DICYCLOMINE HCL 10 MG PO CAPS: ORAL_CAPSULE | ORAL | 1 refills | Status: DC

## 2023-07-30 MED ORDER — SUMATRIPTAN SUCCINATE 50 MG PO TABS: ORAL_TABLET | ORAL | 1 refills | Status: DC

## 2023-07-30 MED ORDER — BUSPIRONE HCL 15 MG PO TABS: 15.0000 mg | ORAL_TABLET | Freq: Three times a day (TID) | ORAL | 1 refills | Status: DC

## 2023-07-30 MED ORDER — ZOLPIDEM TARTRATE 10 MG PO TABS: 10.0000 mg | ORAL_TABLET | Freq: Every evening | ORAL | 0 refills | Status: DC | PRN

## 2023-07-30 MED ORDER — DESVENLAFAXINE SUCCINATE ER 100 MG PO TB24: 100.0000 mg | ORAL_TABLET | Freq: Every day | ORAL | 0 refills | Status: DC

## 2023-07-30 NOTE — Patient Instructions (Addendum)
 Measles; Mumps; Rubella (MMR) Vaccine Injection What is this medication? MEASLES; MUMPS; RUBELLA VACCINE (MEE zuhlz; muhmps; roo bel uh vak SEEN) reduces the risk of measles, mumps, and rubella. It does not treat measles, mumps, or rubella. It is still possible to get measles, mumps, or rubella after receiving this vaccine, but the symptoms may be less severe or not last as long. This medicine may be used for other purposes; ask your health care provider or pharmacist if you have questions. COMMON BRAND NAME(S): M-M-R II What should I tell my care team before I take this medication? They need to know if you have any of these conditions: Bleeding disorder Cancer, such as leukemia or lymphoma Immune system problems Infection with fever Low levels of platelets in the blood Recent blood transfusion or immune globulin infusion Seizure disorder Taking medications for immunosuppression An unusual or allergic reaction to vaccines, other medications, foods, dyes, or preservatives Pregnant or trying to get pregnant Breastfeeding How should I use this medication? This vaccine is injected under the skin or into a muscle. It is given by your care team. A copy of Vaccine Information Statements will be given before each vaccination. Be sure to read this information carefully each time. This sheet may change often. Talk to your care team about the use of this medication in children. While it may be given to children as young as 33 months of age for selected conditions, precautions do apply. Overdosage: If you think you have taken too much of this medicine contact a poison control center or emergency room at once. NOTE: This medicine is only for you. Do not share this medicine with others. What if I miss a dose? Keep appointments for follow-up doses as directed. It is important not to miss your dose. Call your care team if you are unable to keep an appointment. What may interact with this medication? Do  not take this medication with any of the following: Adalimumab Anakinra Etanercept Infliximab Medications that lower your chance of fighting an infection Medications to treat cancer This medication may also interact with the following: Immune globulins Live virus vaccines This list may not describe all possible interactions. Give your health care provider a list of all the medicines, herbs, non-prescription drugs, or dietary supplements you use. Also tell them if you smoke, drink alcohol, or use illegal drugs. Some items may interact with your medicine. What should I watch for while using this medication? This vaccine may not protect from all measles, mumps, and rubella infections. Talk to your care team if you may be pregnant. Avoid pregnancy for 1 month after receiving this vaccine. What side effects may I notice from receiving this medication? Side effects that you should report to your care team as soon as possible: Allergic reactions--skin rash, itching, hives, swelling of the face, lips, tongue, or throat Seizures Unusual bruising or bleeding Side effects that usually do not require medical attention (report these to your care team if they continue or are bothersome): Fever Irritability Pain, redness, or irritation at injection site Skin rash This list may not describe all possible side effects. Call your doctor for medical advice about side effects. You may report side effects to FDA at 1-800-FDA-1088. Where should I keep my medication? This vaccine is only given by your care team. It will not be stored at home. NOTE: This sheet is a summary. It may not cover all possible information. If you have questions about this medicine, talk to your doctor, pharmacist, or health  care provider.  2024 Elsevier/Gold Standard (2022-12-07 00:00:00)

## 2023-07-30 NOTE — Progress Notes (Signed)
 Subjective:    Patient ID: Phyllis Parker, female    DOB: 08/19/1960, 63 y.o.   MRN: [id number]  Chief Complaint  Patient presents with   Medical Management of Chronic Issues   PT presents to the office today for chronic follow up.  She reports her GAD and depression are stable.    Having a lot of stress with daughter who has bipolar. Reports his son was addicted to meth. He is clean now. Her father is sick and emotional. She just feels overwhelmed.   She reports she had her measles titer drawn when she was pregnant that showed she did not have immunity. Requesting MMR today.  Asthma There is no cough, shortness of breath or wheezing. This is a chronic problem. The current episode started more than 1 year ago. The problem occurs intermittently. Associated symptoms include heartburn. Her symptoms are aggravated by exposure to fumes and pollen. Her symptoms are alleviated by beta-agonist. She reports moderate improvement on treatment. Her past medical history is significant for asthma.  Gastroesophageal Reflux She complains of belching and heartburn. She reports no coughing or no wheezing. This is a chronic problem. The current episode started more than 1 year ago. The problem occurs occasionally. The symptoms are aggravated by certain foods. Associated symptoms include fatigue. She has tried a PPI for the symptoms. The treatment provided moderate relief.  Thyroid  Problem Presents for follow-up visit. Symptoms include anxiety, depressed mood, fatigue and weight gain. Patient reports no constipation, diarrhea, dry skin or palpitations. The symptoms have been stable.  Insomnia Primary symptoms: difficulty falling asleep, frequent awakening.   The current episode started more than one year. The onset quality is gradual. The problem occurs intermittently. Past treatments include medication. The treatment provided moderate relief. PMH includes: depression.   Hyperlipidemia This is a chronic problem.  The current episode started more than 1 year ago. The problem is uncontrolled. Recent lipid tests were reviewed and are high. Pertinent negatives include no shortness of breath. Current antihyperlipidemic treatment includes statins. The current treatment provides moderate improvement of lipids. Risk factors for coronary artery disease include dyslipidemia, hypertension, a sedentary lifestyle and post-menopausal.  Anxiety Presents for follow-up visit. Symptoms include decreased concentration, depressed mood, excessive worry, hyperventilation, insomnia, irritability, muscle tension, nervous/anxious behavior, obsessions, panic and restlessness. Patient reports no palpitations, shortness of breath or suicidal ideas. Symptoms occur most days. The severity of symptoms is moderate.   Her past medical history is significant for asthma.  Depression        This is a chronic problem.  The current episode started more than 1 year ago.   The problem occurs intermittently.  Associated symptoms include decreased concentration, fatigue, insomnia, restlessness and sad.  Associated symptoms include no helplessness, no hopelessness and no suicidal ideas.  Past treatments include SNRIs - Serotonin and norepinephrine reuptake inhibitors.  Past medical history includes thyroid  problem and anxiety.       Review of Systems  Constitutional:  Positive for fatigue, irritability and weight gain.  Respiratory:  Negative for cough, shortness of breath and wheezing.   Cardiovascular:  Negative for palpitations.  Gastrointestinal:  Positive for heartburn. Negative for constipation and diarrhea.  Psychiatric/Behavioral:  Positive for decreased concentration and depression. Negative for suicidal ideas. The patient is nervous/anxious and has insomnia.   All other systems reviewed and are negative.  Family History  Problem Relation Age of Onset   Cancer Mother    Prostate cancer Father    Social History  Socioeconomic  History   Marital status: Married    Spouse name: Not on file   Number of children: Not on file   Years of education: Not on file   Highest education level: Not on file  Occupational History   Not on file  Tobacco Use   Smoking status: Never   Smokeless tobacco: Never  Vaping Use   Vaping status: Never Used  Substance and Sexual Activity   Alcohol use: No   Drug use: No   Sexual activity: Not on file  Other Topics Concern   Not on file  Social History Narrative   ** Merged History Encounter **       Social Drivers of Health   Financial Resource Strain: Not on file  Food Insecurity: Not on file  Transportation Needs: Not on file  Physical Activity: Not on file  Stress: Not on file  Social Connections: Not on file       Objective:   Physical Exam Vitals reviewed.  Constitutional:      General: She is not in acute distress.    Appearance: She is well-developed.  HENT:     Head: Normocephalic and atraumatic.     Right Ear: Tympanic membrane normal.     Left Ear: Tympanic membrane normal.  Eyes:     Pupils: Pupils are equal, round, and reactive to light.  Neck:     Thyroid : No thyromegaly.  Cardiovascular:     Rate and Rhythm: Normal rate and regular rhythm.     Heart sounds: Normal heart sounds. No murmur heard. Pulmonary:     Effort: Pulmonary effort is normal. No respiratory distress.     Breath sounds: Normal breath sounds. No wheezing.  Abdominal:     General: Bowel sounds are normal. There is no distension.     Palpations: Abdomen is soft.     Tenderness: There is no abdominal tenderness.  Musculoskeletal:        General: No tenderness. Normal range of motion.     Cervical back: Normal range of motion and neck supple.  Skin:    General: Skin is warm and dry.  Neurological:     Mental Status: She is alert and oriented to person, place, and time.     Cranial Nerves: No cranial nerve deficit.     Deep Tendon Reflexes: Reflexes are normal and symmetric.   Psychiatric:        Mood and Affect: Mood is anxious. Mood is not depressed. Affect is not flat or tearful.        Behavior: Behavior normal.        Thought Content: Thought content normal.        Judgment: Judgment normal.       BP 123/82   Pulse 73   Temp (!) 97 F (36.1 C) (Temporal)   Ht 5' 4 (1.626 m)   Wt 152 lb 3.2 oz (69 kg)   SpO2 93%   BMI 26.13 kg/m      Assessment & Plan:  Dallas Scorsone comes in today with chief complaint of Medical Management of Chronic Issues   Diagnosis and orders addressed:  1. Controlled substance agreement signed - ALPRAZolam  (XANAX ) 1 MG tablet; Take 1 tablet (1 mg total) by mouth 2 (two) times daily as needed for anxiety.  Dispense: 60 tablet; Refill: 2 - zolpidem  (AMBIEN ) 10 MG tablet; Take 1 tablet (10 mg total) by mouth at bedtime as needed. for sleep  Dispense: 90 tablet; Refill:  0 - CMP14+EGFR  2. GAD (generalized anxiety disorder) - ALPRAZolam  (XANAX ) 1 MG tablet; Take 1 tablet (1 mg total) by mouth 2 (two) times daily as needed for anxiety.  Dispense: 60 tablet; Refill: 2 - busPIRone  (BUSPAR ) 15 MG tablet; Take 1 tablet (15 mg total) by mouth 3 (three) times daily.  Dispense: 90 tablet; Refill: 1 - desvenlafaxine  (PRISTIQ ) 100 MG 24 hr tablet; Take 1 tablet (100 mg total) by mouth daily.  Dispense: 90 tablet; Refill: 0 - CMP14+EGFR  3. Primary insomnia - zolpidem  (AMBIEN ) 10 MG tablet; Take 1 tablet (10 mg total) by mouth at bedtime as needed. for sleep  Dispense: 90 tablet; Refill: 0 - CMP14+EGFR  4. Moderate episode of recurrent major depressive disorder (HCC) - desvenlafaxine  (PRISTIQ ) 100 MG 24 hr tablet; Take 1 tablet (100 mg total) by mouth daily.  Dispense: 90 tablet; Refill: 0 - CMP14+EGFR  5. Other migraine without status migrainosus, not intractable  - SUMAtriptan  (IMITREX ) 50 MG tablet; TAKE 1 OR 2 TABLETS AT ONSET OF MIGRAINE. MAY REPEAT IN 2 HOURS IF NEEDED  Dispense: 20 tablet; Refill: 1 - CMP14+EGFR  6.  Adjustment disorder with anxious mood (Primary) - CMP14+EGFR  7. Gastroesophageal reflux disease without esophagitis  - CMP14+EGFR  8. Acquired hypothyroidism - CMP14+EGFR  9. Insomnia due to other mental disorder  - CMP14+EGFR  10. Mild intermittent asthma, unspecified whether complicated  - CMP14+EGFR  11. Hyperlipidemia, unspecified hyperlipidemia type - CMP14+EGFR  12. Need for MMR vaccine Discussed that her insurance would likely cover MMR vaccine today. She wants to call and verify before getting. If they will she will call and make a triage visit.   Labs pending Patient reviewed in [place] controlled database, no flags noted. Contract and drug screen are up to date.  Continue current medications  Health Maintenance reviewed Diet and exercise encouraged  Follow up plan: 3 months   Bari Learn, FNP

## 2023-08-09 ENCOUNTER — Ambulatory Visit: Payer: Self-pay | Admitting: Nurse Practitioner

## 2023-09-02 ENCOUNTER — Other Ambulatory Visit: Payer: Self-pay | Admitting: Family

## 2023-09-04 ENCOUNTER — Other Ambulatory Visit: Payer: Self-pay | Admitting: Family

## 2023-09-04 DIAGNOSIS — M25562 Pain in left knee: Secondary | ICD-10-CM

## 2023-10-31 ENCOUNTER — Ambulatory Visit: Admitting: Family

## 2023-10-31 ENCOUNTER — Encounter: Payer: Self-pay | Admitting: Family

## 2023-10-31 VITALS — BP 127/80 | HR 89 | Temp 97.5°F | Ht 64.0 in | Wt 152.8 lb

## 2023-10-31 DIAGNOSIS — F5101 Primary insomnia: Secondary | ICD-10-CM | POA: Diagnosis not present

## 2023-10-31 DIAGNOSIS — J209 Acute bronchitis, unspecified: Secondary | ICD-10-CM

## 2023-10-31 DIAGNOSIS — F411 Generalized anxiety disorder: Secondary | ICD-10-CM | POA: Diagnosis not present

## 2023-10-31 DIAGNOSIS — Z79899 Other long term (current) drug therapy: Secondary | ICD-10-CM | POA: Diagnosis not present

## 2023-10-31 DIAGNOSIS — F5105 Insomnia due to other mental disorder: Secondary | ICD-10-CM

## 2023-10-31 DIAGNOSIS — E039 Hypothyroidism, unspecified: Secondary | ICD-10-CM | POA: Diagnosis not present

## 2023-10-31 DIAGNOSIS — J4520 Mild intermittent asthma, uncomplicated: Secondary | ICD-10-CM

## 2023-10-31 DIAGNOSIS — G43809 Other migraine, not intractable, without status migrainosus: Secondary | ICD-10-CM | POA: Diagnosis not present

## 2023-10-31 DIAGNOSIS — M8580 Other specified disorders of bone density and structure, unspecified site: Secondary | ICD-10-CM

## 2023-10-31 DIAGNOSIS — K219 Gastro-esophageal reflux disease without esophagitis: Secondary | ICD-10-CM

## 2023-10-31 DIAGNOSIS — Z23 Encounter for immunization: Secondary | ICD-10-CM

## 2023-10-31 DIAGNOSIS — F331 Major depressive disorder, recurrent, moderate: Secondary | ICD-10-CM

## 2023-10-31 DIAGNOSIS — M25561 Pain in right knee: Secondary | ICD-10-CM

## 2023-10-31 DIAGNOSIS — E785 Hyperlipidemia, unspecified: Secondary | ICD-10-CM

## 2023-10-31 MED ORDER — SUMATRIPTAN SUCCINATE 50 MG PO TABS: ORAL_TABLET | ORAL | 1 refills | Status: DC

## 2023-10-31 MED ORDER — MELOXICAM 15 MG PO TABS: 15.0000 mg | ORAL_TABLET | Freq: Every day | ORAL | 1 refills | Status: DC

## 2023-10-31 MED ORDER — DESVENLAFAXINE SUCCINATE ER 100 MG PO TB24: 100.0000 mg | ORAL_TABLET | Freq: Every day | ORAL | 0 refills | Status: DC

## 2023-10-31 MED ORDER — ZOLPIDEM TARTRATE 10 MG PO TABS: 10.0000 mg | ORAL_TABLET | Freq: Every evening | ORAL | 0 refills | Status: DC | PRN

## 2023-10-31 MED ORDER — ATORVASTATIN CALCIUM 10 MG PO TABS: 10.0000 mg | ORAL_TABLET | Freq: Every day | ORAL | 3 refills | Status: AC

## 2023-10-31 MED ORDER — ESOMEPRAZOLE MAGNESIUM 40 MG PO CPDR: DELAYED_RELEASE_CAPSULE | ORAL | 1 refills | Status: DC

## 2023-10-31 MED ORDER — ALPRAZOLAM 1 MG PO TABS: 1.0000 mg | ORAL_TABLET | Freq: Two times a day (BID) | ORAL | 2 refills | Status: DC | PRN

## 2023-10-31 MED ORDER — ALBUTEROL SULFATE HFA 108 (90 BASE) MCG/ACT IN AERS: INHALATION_SPRAY | RESPIRATORY_TRACT | 2 refills | Status: AC

## 2023-10-31 NOTE — Patient Instructions (Signed)

## 2023-10-31 NOTE — Progress Notes (Signed)
 Subjective:    Patient ID: Phyllis Parker, female    DOB: Aug 06, 1960, 63 y.o.   MRN: [id number]  Chief Complaint  Patient presents with   Medical Management of Chronic Issues   PT presents to the office today for chronic follow up.  She reports her GAD and depression are stable.    Having a lot of stress with daughter who has bipolar. Reports his son was addicted to meth. He is clean now.   Asthma There is no cough, shortness of breath or wheezing. This is a chronic problem. The current episode started more than 1 year ago. The problem occurs intermittently. Associated symptoms include heartburn. Her symptoms are aggravated by exposure to fumes and pollen. Her symptoms are alleviated by beta-agonist. She reports moderate improvement on treatment. Her past medical history is significant for asthma.  Gastroesophageal Reflux She complains of belching and heartburn. She reports no coughing or no wheezing. This is a chronic problem. The current episode started more than 1 year ago. The problem occurs occasionally. The symptoms are aggravated by certain foods. Associated symptoms include fatigue. She has tried a PPI for the symptoms. The treatment provided moderate relief.  Thyroid  Problem Presents for follow-up visit. Symptoms include anxiety, depressed mood and fatigue. Patient reports no constipation, diarrhea, dry skin, palpitations or weight gain. The symptoms have been stable.  Insomnia Primary symptoms: difficulty falling asleep, frequent awakening.   The current episode started more than one year. The onset quality is gradual. The problem occurs intermittently. Past treatments include medication. The treatment provided moderate relief. PMH includes: depression.   Hyperlipidemia This is a chronic problem. The current episode started more than 1 year ago. The problem is uncontrolled. Recent lipid tests were reviewed and are high. Pertinent negatives include no shortness of breath. Current  antihyperlipidemic treatment includes statins. The current treatment provides moderate improvement of lipids. Risk factors for coronary artery disease include dyslipidemia, hypertension, a sedentary lifestyle and post-menopausal.  Anxiety Presents for follow-up visit. Symptoms include decreased concentration, depressed mood, excessive worry, hyperventilation, insomnia, irritability, muscle tension, nervous/anxious behavior, obsessions, panic and restlessness. Patient reports no palpitations, shortness of breath or suicidal ideas. Symptoms occur most days. The severity of symptoms is moderate.   Her past medical history is significant for asthma.  Depression        This is a chronic problem.  The current episode started more than 1 year ago.   The problem occurs intermittently.  Associated symptoms include decreased concentration, fatigue, helplessness, hopelessness, insomnia, restlessness and sad.  Associated symptoms include no suicidal ideas.  Past treatments include SNRIs - Serotonin and norepinephrine reuptake inhibitors.  Past medical history includes thyroid  problem and anxiety.       Review of Systems  Constitutional:  Positive for fatigue and irritability. Negative for weight gain.  Respiratory:  Negative for cough, shortness of breath and wheezing.   Cardiovascular:  Negative for palpitations.  Gastrointestinal:  Positive for heartburn. Negative for constipation and diarrhea.  Psychiatric/Behavioral:  Positive for decreased concentration and depression. Negative for suicidal ideas. The patient is nervous/anxious and has insomnia.   All other systems reviewed and are negative.  Family History  Problem Relation Age of Onset   Cancer Mother    Prostate cancer Father    Social History   Socioeconomic History   Marital status: Married    Spouse name: Not on file   Number of children: Not on file   Years of education: Not on file   Highest  education level: Not on file  Occupational  History   Not on file  Tobacco Use   Smoking status: Never   Smokeless tobacco: Never  Vaping Use   Vaping status: Never Used  Substance and Sexual Activity   Alcohol use: No   Drug use: No   Sexual activity: Not on file  Other Topics Concern   Not on file  Social History Narrative   ** Merged History Encounter **       Social Drivers of Health   Financial Resource Strain: Not on file  Food Insecurity: Not on file  Transportation Needs: Not on file  Physical Activity: Not on file  Stress: Not on file  Social Connections: Not on file       Objective:   Physical Exam Vitals reviewed.  Constitutional:      General: She is not in acute distress.    Appearance: She is well-developed.  HENT:     Head: Normocephalic and atraumatic.     Right Ear: Tympanic membrane normal.     Left Ear: Tympanic membrane normal.  Eyes:     Pupils: Pupils are equal, round, and reactive to light.  Neck:     Thyroid : No thyromegaly.  Cardiovascular:     Rate and Rhythm: Normal rate and regular rhythm.     Heart sounds: Normal heart sounds. No murmur heard. Pulmonary:     Effort: Pulmonary effort is normal. No respiratory distress.     Breath sounds: Normal breath sounds. No wheezing.  Abdominal:     General: Bowel sounds are normal. There is no distension.     Palpations: Abdomen is soft.     Tenderness: There is no abdominal tenderness.  Musculoskeletal:        General: No tenderness. Normal range of motion.     Cervical back: Normal range of motion and neck supple.  Skin:    General: Skin is warm and dry.  Neurological:     Mental Status: She is alert and oriented to person, place, and time.     Cranial Nerves: No cranial nerve deficit.     Deep Tendon Reflexes: Reflexes are normal and symmetric.  Psychiatric:        Mood and Affect: Mood is anxious. Mood is not depressed. Affect is not flat or tearful.        Behavior: Behavior normal.        Thought Content: Thought content  normal.        Judgment: Judgment normal.       BP 127/80   Pulse 89   Temp (!) 97.5 F (36.4 C) (Temporal)   Ht 5' 4 (1.626 m)   Wt 152 lb 12.8 oz (69.3 kg)   SpO2 97%   BMI 26.23 kg/m      Assessment & Plan:  Phyllis Parker comes in today with chief complaint of Medical Management of Chronic Issues   Diagnosis and orders addressed:  1. Acute bronchitis, unspecified organism - albuterol  (VENTOLIN  HFA) 108 (90 Base) MCG/ACT inhaler; Inhale 2 puffs into the lungs every 6 hours as needed for wheezing or shortness of breath.  Dispense: 18 g; Refill: 2  2. Controlled substance agreement signed - ALPRAZolam  (XANAX ) 1 MG tablet; Take 1 tablet (1 mg total) by mouth 2 (two) times daily as needed for anxiety.  Dispense: 60 tablet; Refill: 2 - zolpidem  (AMBIEN ) 10 MG tablet; Take 1 tablet (10 mg total) by mouth at bedtime as needed. for sleep  Dispense: 90 tablet; Refill: 0  3. GAD (generalized anxiety disorder) - ALPRAZolam  (XANAX ) 1 MG tablet; Take 1 tablet (1 mg total) by mouth 2 (two) times daily as needed for anxiety.  Dispense: 60 tablet; Refill: 2 - desvenlafaxine  (PRISTIQ ) 100 MG 24 hr tablet; Take 1 tablet (100 mg total) by mouth daily.  Dispense: 90 tablet; Refill: 0  4. Moderate episode of recurrent major depressive disorder (HCC) - desvenlafaxine  (PRISTIQ ) 100 MG 24 hr tablet; Take 1 tablet (100 mg total) by mouth daily.  Dispense: 90 tablet; Refill: 0  5. Gastroesophageal reflux disease without esophagitis - esomeprazole  (NEXIUM ) 40 MG capsule; TAKE 1 CAPSULE DAILY AT NOON  Dispense: 90 capsule; Refill: 1  6. Arthralgia of both lower legs - meloxicam  (MOBIC ) 15 MG tablet; Take 1 tablet (15 mg total) by mouth daily.  Dispense: 30 tablet; Refill: 1  7. Other migraine without status migrainosus, not intractable - SUMAtriptan  (IMITREX ) 50 MG tablet; TAKE 1 OR 2 TABLETS AT ONSET OF MIGRAINE. MAY REPEAT IN 2 HOURS IF NEEDED  Dispense: 20 tablet; Refill: 1  8. Primary  insomnia  - zolpidem  (AMBIEN ) 10 MG tablet; Take 1 tablet (10 mg total) by mouth at bedtime as needed. for sleep  Dispense: 90 tablet; Refill: 0  9. Encounter for immunization (Primary) - Flu vaccine trivalent PF, 6mos and older(Flulaval,Afluria,Fluarix,Fluzone)  10. Osteopenia, unspecified location   11. Insomnia due to other mental disorder  12. Acquired hypothyroidism  13. Hyperlipidemia, unspecified hyperlipidemia type  14. Mild intermittent asthma, unspecified whether complicated   Labs pending Patient reviewed in [place] controlled database, no flags noted. Contract and drug screen are up to date.  Continue current medications  Health Maintenance reviewed Diet and exercise encouraged  Follow up plan: 3 months   Bari Learn, FNP

## 2024-01-31 ENCOUNTER — Ambulatory Visit: Payer: Self-pay | Admitting: Family

## 2024-01-31 ENCOUNTER — Encounter: Payer: Self-pay | Admitting: Family

## 2024-01-31 VITALS — BP 121/77 | HR 87 | Temp 97.6°F | Ht 64.0 in | Wt 160.0 lb

## 2024-01-31 DIAGNOSIS — G43809 Other migraine, not intractable, without status migrainosus: Secondary | ICD-10-CM

## 2024-01-31 DIAGNOSIS — Z79899 Other long term (current) drug therapy: Secondary | ICD-10-CM | POA: Diagnosis not present

## 2024-01-31 DIAGNOSIS — F411 Generalized anxiety disorder: Secondary | ICD-10-CM | POA: Diagnosis not present

## 2024-01-31 DIAGNOSIS — E785 Hyperlipidemia, unspecified: Secondary | ICD-10-CM | POA: Diagnosis not present

## 2024-01-31 DIAGNOSIS — F5105 Insomnia due to other mental disorder: Secondary | ICD-10-CM

## 2024-01-31 DIAGNOSIS — F4322 Adjustment disorder with anxiety: Secondary | ICD-10-CM

## 2024-01-31 DIAGNOSIS — F331 Major depressive disorder, recurrent, moderate: Secondary | ICD-10-CM

## 2024-01-31 DIAGNOSIS — R062 Wheezing: Secondary | ICD-10-CM

## 2024-01-31 DIAGNOSIS — F5101 Primary insomnia: Secondary | ICD-10-CM

## 2024-01-31 DIAGNOSIS — M25561 Pain in right knee: Secondary | ICD-10-CM

## 2024-01-31 DIAGNOSIS — J4520 Mild intermittent asthma, uncomplicated: Secondary | ICD-10-CM

## 2024-01-31 DIAGNOSIS — E039 Hypothyroidism, unspecified: Secondary | ICD-10-CM

## 2024-01-31 DIAGNOSIS — K219 Gastro-esophageal reflux disease without esophagitis: Secondary | ICD-10-CM | POA: Diagnosis not present

## 2024-01-31 DIAGNOSIS — R300 Dysuria: Secondary | ICD-10-CM | POA: Diagnosis not present

## 2024-01-31 LAB — URINALYSIS, COMPLETE
Bilirubin, UA: NEGATIVE
Glucose, UA: NEGATIVE
Ketones, UA: NEGATIVE
Leukocytes,UA: NEGATIVE
Nitrite, UA: POSITIVE — AB
Protein,UA: NEGATIVE
RBC, UA: NEGATIVE
Specific Gravity, UA: 1.02 (ref 1.005–1.030)
Urobilinogen, Ur: 0.2 mg/dL (ref 0.2–1.0)
pH, UA: 7 (ref 5.0–7.5)

## 2024-01-31 LAB — MICROSCOPIC EXAMINATION
Epithelial Cells (non renal): NONE SEEN /HPF (ref 0–10)
RBC, Urine: NONE SEEN /HPF (ref 0–2)
Renal Epithel, UA: NONE SEEN /HPF
WBC, UA: NONE SEEN /HPF (ref 0–5)
Yeast, UA: NONE SEEN

## 2024-01-31 MED ORDER — MELOXICAM 15 MG PO TABS: 15.0000 mg | ORAL_TABLET | Freq: Every day | ORAL | 1 refills | Status: AC

## 2024-01-31 MED ORDER — ALPRAZOLAM 1 MG PO TABS: 1.0000 mg | ORAL_TABLET | Freq: Two times a day (BID) | ORAL | 2 refills | Status: AC | PRN

## 2024-01-31 MED ORDER — ARIPIPRAZOLE 2 MG PO TABS: 2.0000 mg | ORAL_TABLET | Freq: Every day | ORAL | 1 refills | Status: AC

## 2024-01-31 MED ORDER — BUDESONIDE-FORMOTEROL FUMARATE 80-4.5 MCG/ACT IN AERO: 2.0000 | INHALATION_SPRAY | Freq: Two times a day (BID) | RESPIRATORY_TRACT | 3 refills | Status: AC

## 2024-01-31 MED ORDER — CETIRIZINE HCL 10 MG PO TABS: 10.0000 mg | ORAL_TABLET | Freq: Every day | ORAL | 3 refills | Status: AC

## 2024-01-31 MED ORDER — SUMATRIPTAN SUCCINATE 50 MG PO TABS: ORAL_TABLET | ORAL | 1 refills | Status: AC

## 2024-01-31 MED ORDER — DICYCLOMINE HCL 10 MG PO CAPS: ORAL_CAPSULE | ORAL | 1 refills | Status: AC

## 2024-01-31 MED ORDER — ESOMEPRAZOLE MAGNESIUM 40 MG PO CPDR: DELAYED_RELEASE_CAPSULE | ORAL | 1 refills | Status: AC

## 2024-01-31 MED ORDER — ZOLPIDEM TARTRATE 10 MG PO TABS: 10.0000 mg | ORAL_TABLET | Freq: Every evening | ORAL | 0 refills | Status: AC | PRN

## 2024-01-31 MED ORDER — DESVENLAFAXINE SUCCINATE ER 100 MG PO TB24: 100.0000 mg | ORAL_TABLET | Freq: Every day | ORAL | 0 refills | Status: AC

## 2024-01-31 MED ORDER — BUSPIRONE HCL 15 MG PO TABS: 15.0000 mg | ORAL_TABLET | Freq: Three times a day (TID) | ORAL | 1 refills | Status: AC

## 2024-01-31 NOTE — Progress Notes (Signed)
 "  Subjective:    Patient ID: Phyllis Parker, female    DOB: [birth year], 64 y.o.   MRN: [id number]  Chief Complaint  Patient presents with   Medical Management of Chronic Issues   Dysuria   Agitation    Last month only getting worse    Thyroid  Problem    Wants thyroid  rck    PT presents to the office today for chronic follow up.  She reports her GAD and depression  are worsening right now.   Having a lot of stress with daughter who has bipolar. Reports his son was addicted to meth. He is clean now.   Asthma There is no cough, shortness of breath or wheezing. This is a chronic problem. The current episode started more than 1 year ago. The problem occurs intermittently. The problem has been waxing and waning. Associated symptoms include heartburn. Her symptoms are aggravated by exposure to fumes and pollen. Her symptoms are alleviated by beta-agonist. She reports moderate improvement on treatment. Her symptoms are not alleviated by rest. Her past medical history is significant for asthma.  Gastroesophageal Reflux She complains of belching and heartburn. She reports no coughing, no nausea or no wheezing. This is a chronic problem. The current episode started more than 1 year ago. The problem occurs occasionally. The symptoms are aggravated by certain foods. Associated symptoms include fatigue. She has tried a PPI for the symptoms. The treatment provided moderate relief.  Thyroid  Problem Presents for follow-up visit. Symptoms include anxiety, depressed mood, dry skin, fatigue and palpitations. Patient reports no constipation, diarrhea or weight gain. The symptoms have been stable.  Insomnia Primary symptoms: difficulty falling asleep, frequent awakening.   The current episode started more than one year. The onset quality is gradual. The problem occurs intermittently. Past treatments include medication. The treatment provided moderate relief. PMH includes: depression.   Hyperlipidemia This is a  chronic problem. The current episode started more than 1 year ago. The problem is uncontrolled. Recent lipid tests were reviewed and are high. Pertinent negatives include no shortness of breath. Current antihyperlipidemic treatment includes statins. The current treatment provides moderate improvement of lipids. Risk factors for coronary artery disease include dyslipidemia, hypertension, a sedentary lifestyle and post-menopausal.  Anxiety Presents for follow-up visit. Symptoms include decreased concentration, depressed mood, excessive worry, hyperventilation, insomnia, irritability, muscle tension, nervous/anxious behavior, obsessions, palpitations, panic and restlessness. Patient reports no nausea, shortness of breath or suicidal ideas. Symptoms occur constantly. The severity of symptoms is severe.   Her past medical history is significant for asthma.  Depression        This is a chronic problem.  The current episode started more than 1 year ago.   The problem occurs intermittently.  Associated symptoms include decreased concentration, fatigue, helplessness, hopelessness, insomnia, restlessness and sad.  Associated symptoms include no suicidal ideas.  Past treatments include SNRIs - Serotonin and norepinephrine reuptake inhibitors.  Past medical history includes thyroid  problem and anxiety.   Dysuria  This is a new problem. The current episode started 1 to 4 weeks ago. The problem occurs intermittently. The problem has been gradually worsening. The quality of the pain is described as burning. The pain is at a severity of 2/10. The pain is mild. Associated symptoms include frequency, hesitancy and urgency. Pertinent negatives include no flank pain, hematuria, nausea or vomiting. She has tried increased fluids for the symptoms. The treatment provided mild relief.      Review of Systems  Constitutional:  Positive for fatigue and  irritability. Negative for weight gain.  Respiratory:  Negative for cough,  shortness of breath and wheezing.   Cardiovascular:  Positive for palpitations.  Gastrointestinal:  Positive for heartburn. Negative for constipation, diarrhea, nausea and vomiting.  Genitourinary:  Positive for dysuria, frequency, hesitancy and urgency. Negative for flank pain and hematuria.  Psychiatric/Behavioral:  Positive for decreased concentration and depression. Negative for suicidal ideas. The patient is nervous/anxious and has insomnia.   All other systems reviewed and are negative.  Family History  Problem Relation Age of Onset   Cancer Mother    Prostate cancer Father    Social History   Socioeconomic History   Marital status: Married    Spouse name: Not on file   Number of children: Not on file   Years of education: Not on file   Highest education level: Not on file  Occupational History   Not on file  Tobacco Use   Smoking status: Never   Smokeless tobacco: Never  Vaping Use   Vaping status: Never Used  Substance and Sexual Activity   Alcohol use: No   Drug use: No   Sexual activity: Not on file  Other Topics Concern   Not on file  Social History Narrative   ** Merged History Encounter **       Social Drivers of Health   Tobacco Use: Low Risk (01/31/2024)   Patient History    Smoking Tobacco Use: Never    Smokeless Tobacco Use: Never    Passive Exposure: Not on file  Financial Resource Strain: Not on file  Food Insecurity: Not on file  Transportation Needs: Not on file  Physical Activity: Not on file  Stress: Not on file  Social Connections: Not on file  Depression (EYV7-0): Medium Risk (01/31/2024)   Depression (PHQ2-9)    PHQ-2 Score: 8  Alcohol Screen: Not on file  Housing: Not on file  Utilities: Not on file  Health Literacy: Not on file       Objective:   Physical Exam Vitals reviewed.  Constitutional:      General: She is not in acute distress.    Appearance: She is well-developed.  HENT:     Head: Normocephalic and atraumatic.      Right Ear: Tympanic membrane normal.     Left Ear: Tympanic membrane normal.  Eyes:     Pupils: Pupils are equal, round, and reactive to light.  Neck:     Thyroid : No thyromegaly.  Cardiovascular:     Rate and Rhythm: Normal rate and regular rhythm.     Heart sounds: Normal heart sounds. No murmur heard. Pulmonary:     Effort: Pulmonary effort is normal. No respiratory distress.     Breath sounds: Normal breath sounds. No wheezing.  Abdominal:     General: Bowel sounds are normal. There is no distension.     Palpations: Abdomen is soft.     Tenderness: There is no abdominal tenderness.  Musculoskeletal:        General: No tenderness. Normal range of motion.     Cervical back: Normal range of motion and neck supple.  Skin:    General: Skin is warm and dry.  Neurological:     Mental Status: She is alert and oriented to person, place, and time.     Cranial Nerves: No cranial nerve deficit.     Deep Tendon Reflexes: Reflexes are normal and symmetric.  Psychiatric:        Mood and Affect: Mood is  anxious. Mood is not depressed. Affect is not flat or tearful.        Behavior: Behavior normal.        Thought Content: Thought content normal.        Judgment: Judgment normal.       BP 121/77   Pulse 87   Temp 97.6 F (36.4 C) (Temporal)   Ht 5' 4 (1.626 m)   Wt 160 lb (72.6 kg)   BMI 27.46 kg/m      Assessment & Plan:  Jeyda Siebel comes in today with chief complaint of Medical Management of Chronic Issues, Dysuria, Agitation (Last month only getting worse ), and Thyroid  Problem (Wants thyroid  rck )   Diagnosis and orders addressed:  1. Controlled substance agreement signed - ALPRAZolam  (XANAX ) 1 MG tablet; Take 1 tablet (1 mg total) by mouth 2 (two) times daily as needed for anxiety.  Dispense: 60 tablet; Refill: 2 - zolpidem  (AMBIEN ) 10 MG tablet; Take 1 tablet (10 mg total) by mouth at bedtime as needed. for sleep  Dispense: 90 tablet; Refill: 0 - CBC with  Differential/Platelet - CMP14+EGFR  2. GAD (generalized anxiety disorder) - ALPRAZolam  (XANAX ) 1 MG tablet; Take 1 tablet (1 mg total) by mouth 2 (two) times daily as needed for anxiety.  Dispense: 60 tablet; Refill: 2 - busPIRone  (BUSPAR ) 15 MG tablet; Take 1 tablet (15 mg total) by mouth 3 (three) times daily.  Dispense: 90 tablet; Refill: 1 - desvenlafaxine  (PRISTIQ ) 100 MG 24 hr tablet; Take 1 tablet (100 mg total) by mouth daily.  Dispense: 90 tablet; Refill: 0 - ARIPiprazole (ABILIFY) 2 MG tablet; Take 1 tablet (2 mg total) by mouth daily.  Dispense: 90 tablet; Refill: 1 - CBC with Differential/Platelet - CMP14+EGFR  3. Wheeze - budesonide -formoterol  (SYMBICORT ) 80-4.5 MCG/ACT inhaler; Inhale 2 puffs into the lungs 2 (two) times daily.  Dispense: 1 each; Refill: 3 - CBC with Differential/Platelet - CMP14+EGFR  4. Mild intermittent asthma, unspecified whether complicated - cetirizine  (ZYRTEC ) 10 MG tablet; Take 1 tablet (10 mg total) by mouth daily.  Dispense: 90 tablet; Refill: 3 - CBC with Differential/Platelet - CMP14+EGFR  5. Moderate episode of recurrent major depressive disorder (HCC) - desvenlafaxine  (PRISTIQ ) 100 MG 24 hr tablet; Take 1 tablet (100 mg total) by mouth daily.  Dispense: 90 tablet; Refill: 0 - ARIPiprazole (ABILIFY) 2 MG tablet; Take 1 tablet (2 mg total) by mouth daily.  Dispense: 90 tablet; Refill: 1 - CBC with Differential/Platelet - CMP14+EGFR  6. Gastroesophageal reflux disease without esophagitis  - esomeprazole  (NEXIUM ) 40 MG capsule; TAKE 1 CAPSULE DAILY AT NOON  Dispense: 90 capsule; Refill: 1 - CBC with Differential/Platelet - CMP14+EGFR  7. Arthralgia of both lower legs - meloxicam  (MOBIC ) 15 MG tablet; Take 1 tablet (15 mg total) by mouth daily.  Dispense: 30 tablet; Refill: 1 - CBC with Differential/Platelet - CMP14+EGFR  8. Other migraine without status migrainosus, not intractable - SUMAtriptan  (IMITREX ) 50 MG tablet; TAKE 1 OR 2  TABLETS AT ONSET OF MIGRAINE. MAY REPEAT IN 2 HOURS IF NEEDED  Dispense: 20 tablet; Refill: 1 - CBC with Differential/Platelet - CMP14+EGFR  9. Primary insomnia - zolpidem  (AMBIEN ) 10 MG tablet; Take 1 tablet (10 mg total) by mouth at bedtime as needed. for sleep  Dispense: 90 tablet; Refill: 0 - CBC with Differential/Platelet - CMP14+EGFR  10. Dysuria (Primary)  - Urinalysis, Complete - Urine Culture - CBC with Differential/Platelet - CMP14+EGFR  11. Adjustment disorder with anxious mood - CBC with  Differential/Platelet - CMP14+EGFR  12. Hyperlipidemia, unspecified hyperlipidemia type - CBC with Differential/Platelet - CMP14+EGFR  13. Acquired hypothyroidism  - TSH - CBC with Differential/Platelet - CMP14+EGFR  14. Insomnia due to other mental disorder - ARIPiprazole (ABILIFY) 2 MG tablet; Take 1 tablet (2 mg total) by mouth daily.  Dispense: 90 tablet; Refill: 1 - CBC with Differential/Platelet - CMP14+EGFR   Labs pending Patient reviewed in [place] controlled database, no flags noted. Contract and drug screen are up to date.  Will add Abilify 2 mg today Continue current medications  Health Maintenance reviewed Diet and exercise encouraged  Follow up plan: 3 months   Bari Learn, FNP   "

## 2024-01-31 NOTE — Patient Instructions (Signed)

## 2024-02-01 LAB — CBC WITH DIFFERENTIAL/PLATELET
Basophils Absolute: 0 10*3/uL (ref 0.0–0.2)
Basos: 1 %
EOS (ABSOLUTE): 0 10*3/uL (ref 0.0–0.4)
Eos: 0 %
Hematocrit: 34.5 % (ref 34.0–46.6)
Hemoglobin: 11.2 g/dL (ref 11.1–15.9)
Immature Grans (Abs): 0 10*3/uL (ref 0.0–0.1)
Immature Granulocytes: 0 %
Lymphocytes Absolute: 1.5 10*3/uL (ref 0.7–3.1)
Lymphs: 34 %
MCH: 29.2 pg (ref 26.6–33.0)
MCHC: 32.5 g/dL (ref 31.5–35.7)
MCV: 90 fL (ref 79–97)
Monocytes Absolute: 0.3 10*3/uL (ref 0.1–0.9)
Monocytes: 6 %
Neutrophils Absolute: 2.7 10*3/uL (ref 1.4–7.0)
Neutrophils: 59 %
Platelets: 223 10*3/uL (ref 150–450)
RBC: 3.83 x10E6/uL (ref 3.77–5.28)
RDW: 12.1 % (ref 11.7–15.4)
WBC: 4.5 10*3/uL (ref 3.4–10.8)

## 2024-02-01 LAB — CMP14+EGFR
ALT: 50 [IU]/L — ABNORMAL HIGH (ref 0–32)
AST: 68 [IU]/L — ABNORMAL HIGH (ref 0–40)
Albumin: 4.3 g/dL (ref 3.9–4.9)
Alkaline Phosphatase: 102 [IU]/L (ref 49–135)
BUN/Creatinine Ratio: 17 (ref 12–28)
BUN: 15 mg/dL (ref 8–27)
Bilirubin Total: 0.3 mg/dL (ref 0.0–1.2)
CO2: 21 mmol/L (ref 20–29)
Calcium: 9.2 mg/dL (ref 8.7–10.3)
Chloride: 102 mmol/L (ref 96–106)
Creatinine, Ser: 0.89 mg/dL (ref 0.57–1.00)
Globulin, Total: 2.2 g/dL (ref 1.5–4.5)
Glucose: 81 mg/dL (ref 70–99)
Potassium: 4.2 mmol/L (ref 3.5–5.2)
Sodium: 140 mmol/L (ref 134–144)
Total Protein: 6.5 g/dL (ref 6.0–8.5)
eGFR: 74 mL/min/{1.73_m2}

## 2024-02-01 LAB — TSH: TSH: 0.166 u[IU]/mL — ABNORMAL LOW (ref 0.450–4.500)

## 2024-02-02 LAB — URINE CULTURE

## 2024-02-06 MED ORDER — LEVOTHYROXINE SODIUM 88 MCG PO TABS: 88.0000 ug | ORAL_TABLET | Freq: Every day | ORAL | 3 refills | Status: AC

## 2024-02-10 ENCOUNTER — Telehealth: Payer: Self-pay | Admitting: Pharmacy Technician

## 2024-02-10 ENCOUNTER — Other Ambulatory Visit (HOSPITAL_COMMUNITY): Payer: Self-pay

## 2024-02-10 NOTE — Telephone Encounter (Signed)
 Pharmacy Patient Advocate Encounter  Received notification from [employer] that Prior Authorization for Zolpidem  Tartrate 10MG  tablets has been APPROVED from 02/10/2024 to 08/08/2024. Ran test claim, Copay is $4.00. This test claim was processed through [facility]- copay amounts may vary at other pharmacies due to pharmacy/plan contracts, or as the patient moves through the different stages of their insurance plan.   PA #/Case ID/Reference #: [id number]

## 2024-02-13 NOTE — Progress Notes (Signed)
 Phyllis Parker                                          MRN: [id number]   02/13/2024   The [facility] Quality Team Specialist reviewed this patient medical record for the purposes of chart review for care gap closure. The following were reviewed: chart review for care gap closure-colorectal cancer screening.    [facility] Quality Team

## 2024-05-01 ENCOUNTER — Ambulatory Visit: Admitting: Family
